# Patient Record
Sex: Female | Born: 1935
Health system: Southern US, Community
[De-identification: ages and names within clinical notes are randomized; demographics above are authoritative.]

## PROBLEM LIST (undated history)

## (undated) DIAGNOSIS — I10 Essential (primary) hypertension: Secondary | ICD-10-CM

## (undated) DIAGNOSIS — E119 Type 2 diabetes mellitus without complications: Secondary | ICD-10-CM

---

## 2005-04-27 ENCOUNTER — Ambulatory Visit: Payer: Self-pay

## 2005-05-01 ENCOUNTER — Emergency Department: Payer: Self-pay | Admitting: Emergency Medicine

## 2005-05-02 ENCOUNTER — Emergency Department: Payer: Self-pay | Admitting: Emergency Medicine

## 2005-05-26 ENCOUNTER — Emergency Department: Payer: Self-pay | Admitting: Emergency Medicine

## 2006-04-28 ENCOUNTER — Ambulatory Visit: Payer: Self-pay

## 2006-05-09 ENCOUNTER — Ambulatory Visit: Payer: Self-pay

## 2006-11-15 ENCOUNTER — Ambulatory Visit: Payer: Self-pay

## 2007-05-30 ENCOUNTER — Ambulatory Visit: Payer: Self-pay | Admitting: Internal Medicine

## 2008-02-02 ENCOUNTER — Emergency Department: Payer: Self-pay | Admitting: Emergency Medicine

## 2008-05-31 ENCOUNTER — Ambulatory Visit: Payer: Self-pay | Admitting: Internal Medicine

## 2009-01-14 ENCOUNTER — Ambulatory Visit: Payer: Self-pay | Admitting: Internal Medicine

## 2009-05-17 ENCOUNTER — Emergency Department: Payer: Self-pay | Admitting: Emergency Medicine

## 2010-03-11 ENCOUNTER — Ambulatory Visit: Payer: Self-pay | Admitting: Internal Medicine

## 2010-04-22 ENCOUNTER — Emergency Department: Payer: Self-pay | Admitting: Emergency Medicine

## 2010-04-24 ENCOUNTER — Ambulatory Visit: Payer: Self-pay | Admitting: Surgery

## 2010-05-12 ENCOUNTER — Ambulatory Visit: Payer: Self-pay | Admitting: Emergency Medicine

## 2010-05-15 LAB — PATHOLOGY REPORT

## 2010-12-13 ENCOUNTER — Emergency Department: Payer: Self-pay | Admitting: Emergency Medicine

## 2010-12-15 ENCOUNTER — Emergency Department: Payer: Self-pay | Admitting: Emergency Medicine

## 2011-03-31 ENCOUNTER — Ambulatory Visit: Payer: Self-pay | Admitting: Internal Medicine

## 2011-12-01 LAB — CBC
HCT: 39.2 % (ref 35.0–47.0)
MCH: 24.5 pg — ABNORMAL LOW (ref 26.0–34.0)
MCHC: 32.3 g/dL (ref 32.0–36.0)
MCV: 76 fL — ABNORMAL LOW (ref 80–100)
Platelet: 115 10*3/uL — ABNORMAL LOW (ref 150–440)
RDW: 14.6 % — ABNORMAL HIGH (ref 11.5–14.5)

## 2011-12-01 LAB — URINALYSIS, COMPLETE
Bilirubin,UR: NEGATIVE
Blood: NEGATIVE
Ketone: NEGATIVE
Nitrite: NEGATIVE
Ph: 6 (ref 4.5–8.0)
RBC,UR: NONE SEEN /HPF (ref 0–5)
Specific Gravity: 1.003 (ref 1.003–1.030)
Squamous Epithelial: <1
WBC UR: <1 /HPF (ref 0–5)

## 2011-12-01 LAB — COMPREHENSIVE METABOLIC PANEL
Albumin: 4 g/dL (ref 3.4–5.0)
Alkaline Phosphatase: 52 U/L (ref 50–136)
BUN: 14 mg/dL (ref 7–18)
Bilirubin,Total: 0.5 mg/dL (ref 0.2–1.0)
Calcium, Total: 9.5 mg/dL (ref 8.5–10.1)
Creatinine: 0.9 mg/dL (ref 0.60–1.30)
EGFR (Non-African Amer.): >60
Osmolality: 282 (ref 275–301)
SGPT (ALT): 29 U/L
Sodium: 140 mmol/L (ref 136–145)
Total Protein: 7.5 g/dL (ref 6.4–8.2)

## 2011-12-01 LAB — TROPONIN I: Troponin-I: <0.02 ng/mL

## 2011-12-02 ENCOUNTER — Observation Stay: Payer: Self-pay | Admitting: Internal Medicine

## 2011-12-02 DIAGNOSIS — I369 Nonrheumatic tricuspid valve disorder, unspecified: Secondary | ICD-10-CM

## 2011-12-02 LAB — LIPID PANEL
Cholesterol: 199 mg/dL (ref 0–200)
Triglycerides: 211 mg/dL — ABNORMAL HIGH (ref 0–200)
VLDL Cholesterol, Calc: 42 mg/dL — ABNORMAL HIGH (ref 5–40)

## 2011-12-02 LAB — TSH: Thyroid Stimulating Horm: 1.3 u[IU]/mL

## 2011-12-03 LAB — CBC WITH DIFFERENTIAL/PLATELET
Basophil #: 0 10*3/uL (ref 0.0–0.1)
Eosinophil #: 0.1 10*3/uL (ref 0.0–0.7)
Eosinophil %: 0.7 %
HCT: 42.5 % (ref 35.0–47.0)
Lymphocyte %: 16.8 %
MCH: 24.1 pg — ABNORMAL LOW (ref 26.0–34.0)
MCHC: 31.5 g/dL — ABNORMAL LOW (ref 32.0–36.0)
MCV: 76 fL — ABNORMAL LOW (ref 80–100)
Monocyte %: 6.8 %
Neutrophil %: 75.4 %
Platelet: 198 10*3/uL (ref 150–440)
RDW: 14.3 % (ref 11.5–14.5)
WBC: 8.3 10*3/uL (ref 3.6–11.0)

## 2011-12-03 LAB — BASIC METABOLIC PANEL
BUN: 15 mg/dL (ref 7–18)
EGFR (African American): >60
EGFR (Non-African Amer.): >60
Glucose: 202 mg/dL — ABNORMAL HIGH (ref 65–99)
Potassium: 3.7 mmol/L (ref 3.5–5.1)
Sodium: 146 mmol/L — ABNORMAL HIGH (ref 136–145)

## 2012-03-07 ENCOUNTER — Emergency Department: Payer: Self-pay | Admitting: Emergency Medicine

## 2012-03-07 LAB — CK TOTAL AND CKMB (NOT AT ARMC)
CK, Total: 132 U/L (ref 21–215)
CK-MB: 2.1 ng/mL (ref 0.5–3.6)

## 2012-03-07 LAB — COMPREHENSIVE METABOLIC PANEL
Albumin: 3.8 g/dL (ref 3.4–5.0)
Alkaline Phosphatase: 66 U/L (ref 50–136)
Anion Gap: 12 (ref 7–16)
BUN: 17 mg/dL (ref 7–18)
Calcium, Total: 9 mg/dL (ref 8.5–10.1)
Co2: 23 mmol/L (ref 21–32)
EGFR (Non-African Amer.): 50 — ABNORMAL LOW
Glucose: 273 mg/dL — ABNORMAL HIGH (ref 65–99)
Osmolality: 291 (ref 275–301)
Potassium: 3.8 mmol/L (ref 3.5–5.1)
SGPT (ALT): 23 U/L

## 2012-03-07 LAB — URINALYSIS, COMPLETE
Bilirubin,UR: NEGATIVE
Glucose,UR: 150 mg/dL (ref 0–75)
Ph: 5 (ref 4.5–8.0)
RBC,UR: 1 /HPF (ref 0–5)
Specific Gravity: 1.015 (ref 1.003–1.030)
Squamous Epithelial: 3
WBC UR: 1 /HPF (ref 0–5)

## 2012-03-07 LAB — CBC
MCH: 23.9 pg — ABNORMAL LOW (ref 26.0–34.0)
MCHC: 31.6 g/dL — ABNORMAL LOW (ref 32.0–36.0)
MCV: 76 fL — ABNORMAL LOW (ref 80–100)
RBC: 5.12 10*6/uL (ref 3.80–5.20)
RDW: 14.8 % — ABNORMAL HIGH (ref 11.5–14.5)
WBC: 9.4 10*3/uL (ref 3.6–11.0)

## 2012-03-31 ENCOUNTER — Ambulatory Visit: Payer: Self-pay | Admitting: Internal Medicine

## 2012-05-21 ENCOUNTER — Emergency Department: Payer: Self-pay | Admitting: Emergency Medicine

## 2013-04-02 ENCOUNTER — Ambulatory Visit: Payer: Self-pay | Admitting: Internal Medicine

## 2013-12-25 ENCOUNTER — Emergency Department: Payer: Self-pay | Admitting: Emergency Medicine

## 2014-04-05 ENCOUNTER — Ambulatory Visit: Payer: Self-pay | Admitting: Internal Medicine

## 2014-06-06 ENCOUNTER — Emergency Department: Payer: Self-pay | Admitting: Student

## 2014-06-06 LAB — BASIC METABOLIC PANEL
Anion Gap: 13 (ref 7–16)
BUN: 27 mg/dL — AB (ref 7–18)
CHLORIDE: 102 mmol/L (ref 98–107)
CO2: 25 mmol/L (ref 21–32)
Calcium, Total: 8.9 mg/dL (ref 8.5–10.1)
Creatinine: 1.78 mg/dL — ABNORMAL HIGH (ref 0.60–1.30)
EGFR (African American): 36 — ABNORMAL LOW
GFR CALC NON AF AMER: 30 — AB
Glucose: 208 mg/dL — ABNORMAL HIGH (ref 65–99)
Osmolality: 291 (ref 275–301)
Potassium: 3.6 mmol/L (ref 3.5–5.1)
Sodium: 140 mmol/L (ref 136–145)

## 2014-06-06 LAB — CBC WITH DIFFERENTIAL/PLATELET
BASOS ABS: 0.1 10*3/uL (ref 0.0–0.1)
Basophil %: 0.5 %
EOS ABS: 0.1 10*3/uL (ref 0.0–0.7)
Eosinophil %: 1.2 %
HCT: 38.5 % (ref 35.0–47.0)
HGB: 12.1 g/dL (ref 12.0–16.0)
Lymphocyte #: 1.6 10*3/uL (ref 1.0–3.6)
Lymphocyte %: 14 %
MCH: 23.7 pg — ABNORMAL LOW (ref 26.0–34.0)
MCHC: 31.5 g/dL — ABNORMAL LOW (ref 32.0–36.0)
MCV: 75 fL — ABNORMAL LOW (ref 80–100)
MONO ABS: 0.9 x10 3/mm (ref 0.2–0.9)
Monocyte %: 8.2 %
Neutrophil #: 8.6 10*3/uL — ABNORMAL HIGH (ref 1.4–6.5)
Neutrophil %: 76.1 %
Platelet: 155 10*3/uL (ref 150–440)
RBC: 5.12 10*6/uL (ref 3.80–5.20)
RDW: 14.2 % (ref 11.5–14.5)
WBC: 11.3 10*3/uL — ABNORMAL HIGH (ref 3.6–11.0)

## 2014-06-06 LAB — TROPONIN I: Troponin-I: <0.02 ng/mL

## 2014-06-06 LAB — URINALYSIS, COMPLETE
Bilirubin,UR: NEGATIVE
Blood: NEGATIVE
Glucose,UR: NEGATIVE mg/dL (ref 0–75)
KETONE: NEGATIVE
Leukocyte Esterase: NEGATIVE
NITRITE: POSITIVE
Ph: 5 (ref 4.5–8.0)
Protein: NEGATIVE
SPECIFIC GRAVITY: 1.006 (ref 1.003–1.030)
Squamous Epithelial: 1
WBC UR: 1 /HPF (ref 0–5)

## 2014-06-06 LAB — HEPATIC FUNCTION PANEL A (ARMC)
AST: 34 U/L (ref 15–37)
Albumin: 3.6 g/dL (ref 3.4–5.0)
Alkaline Phosphatase: 50 U/L
BILIRUBIN DIRECT: 0.1 mg/dL (ref 0.00–0.20)
BILIRUBIN TOTAL: 0.3 mg/dL (ref 0.2–1.0)
SGPT (ALT): 25 U/L
TOTAL PROTEIN: 6.5 g/dL (ref 6.4–8.2)

## 2014-06-09 LAB — URINE CULTURE

## 2014-06-25 ENCOUNTER — Inpatient Hospital Stay: Payer: Self-pay | Admitting: Internal Medicine

## 2014-06-25 LAB — COMPREHENSIVE METABOLIC PANEL
ALBUMIN: 3.2 g/dL — AB (ref 3.4–5.0)
ANION GAP: 8 (ref 7–16)
Alkaline Phosphatase: 37 U/L — ABNORMAL LOW
BILIRUBIN TOTAL: 0.3 mg/dL (ref 0.2–1.0)
BUN: 25 mg/dL — ABNORMAL HIGH (ref 7–18)
CHLORIDE: 104 mmol/L (ref 98–107)
CO2: 31 mmol/L (ref 21–32)
Calcium, Total: 9.5 mg/dL (ref 8.5–10.1)
Creatinine: 1.66 mg/dL — ABNORMAL HIGH (ref 0.60–1.30)
EGFR (African American): 39 — ABNORMAL LOW
EGFR (Non-African Amer.): 32 — ABNORMAL LOW
Glucose: 158 mg/dL — ABNORMAL HIGH (ref 65–99)
Osmolality: 293 (ref 275–301)
POTASSIUM: 3.9 mmol/L (ref 3.5–5.1)
SGOT(AST): 24 U/L (ref 15–37)
SGPT (ALT): 23 U/L
Sodium: 143 mmol/L (ref 136–145)
Total Protein: 6 g/dL — ABNORMAL LOW (ref 6.4–8.2)

## 2014-06-25 LAB — CBC
HCT: 35.5 % (ref 35.0–47.0)
HGB: 11.1 g/dL — ABNORMAL LOW (ref 12.0–16.0)
MCH: 23.8 pg — ABNORMAL LOW (ref 26.0–34.0)
MCHC: 31.4 g/dL — AB (ref 32.0–36.0)
MCV: 76 fL — ABNORMAL LOW (ref 80–100)
PLATELETS: 178 10*3/uL (ref 150–440)
RBC: 4.67 10*6/uL (ref 3.80–5.20)
RDW: 14.5 % (ref 11.5–14.5)
WBC: 7.7 10*3/uL (ref 3.6–11.0)

## 2014-06-25 LAB — HEMOGLOBIN A1C: HEMOGLOBIN A1C: 7 % — AB (ref 4.2–6.3)

## 2014-06-25 LAB — URINALYSIS, COMPLETE
Bilirubin,UR: NEGATIVE
Glucose,UR: 50 mg/dL (ref 0–75)
Ketone: NEGATIVE
Nitrite: POSITIVE
PH: 5 (ref 4.5–8.0)
PROTEIN: NEGATIVE
Specific Gravity: 1.016 (ref 1.003–1.030)
Squamous Epithelial: 3

## 2014-06-25 LAB — TSH: Thyroid Stimulating Horm: 1.75 u[IU]/mL

## 2014-06-26 LAB — CBC WITH DIFFERENTIAL/PLATELET
Basophil #: 0 10*3/uL (ref 0.0–0.1)
Basophil %: 0.4 %
Eosinophil #: 0.2 10*3/uL (ref 0.0–0.7)
Eosinophil %: 3.1 %
HCT: 34.3 % — AB (ref 35.0–47.0)
HGB: 10.5 g/dL — ABNORMAL LOW (ref 12.0–16.0)
LYMPHS ABS: 1.7 10*3/uL (ref 1.0–3.6)
LYMPHS PCT: 25 %
MCH: 23.3 pg — ABNORMAL LOW (ref 26.0–34.0)
MCHC: 30.7 g/dL — ABNORMAL LOW (ref 32.0–36.0)
MCV: 76 fL — ABNORMAL LOW (ref 80–100)
MONOS PCT: 9.5 %
Monocyte #: 0.7 x10 3/mm (ref 0.2–0.9)
Neutrophil #: 4.3 10*3/uL (ref 1.4–6.5)
Neutrophil %: 62 %
Platelet: 164 10*3/uL (ref 150–440)
RBC: 4.51 10*6/uL (ref 3.80–5.20)
RDW: 14.6 % — AB (ref 11.5–14.5)
WBC: 6.9 10*3/uL (ref 3.6–11.0)

## 2014-06-26 LAB — BASIC METABOLIC PANEL
ANION GAP: 8 (ref 7–16)
BUN: 23 mg/dL — ABNORMAL HIGH (ref 7–18)
Calcium, Total: 8.7 mg/dL (ref 8.5–10.1)
Chloride: 106 mmol/L (ref 98–107)
Co2: 29 mmol/L (ref 21–32)
Creatinine: 1.4 mg/dL — ABNORMAL HIGH (ref 0.60–1.30)
EGFR (African American): 47 — ABNORMAL LOW
GFR CALC NON AF AMER: 39 — AB
GLUCOSE: 169 mg/dL — AB (ref 65–99)
Osmolality: 293 (ref 275–301)
Potassium: 3.4 mmol/L — ABNORMAL LOW (ref 3.5–5.1)
Sodium: 143 mmol/L (ref 136–145)

## 2014-06-27 LAB — BASIC METABOLIC PANEL
Anion Gap: 8 (ref 7–16)
BUN: 24 mg/dL — ABNORMAL HIGH (ref 7–18)
CALCIUM: 9.1 mg/dL (ref 8.5–10.1)
CO2: 29 mmol/L (ref 21–32)
CREATININE: 1.37 mg/dL — AB (ref 0.60–1.30)
Chloride: 105 mmol/L (ref 98–107)
EGFR (Non-African Amer.): 40 — ABNORMAL LOW
GFR CALC AF AMER: 48 — AB
Glucose: 226 mg/dL — ABNORMAL HIGH (ref 65–99)
OSMOLALITY: 294 (ref 275–301)
POTASSIUM: 3.9 mmol/L (ref 3.5–5.1)
Sodium: 142 mmol/L (ref 136–145)

## 2014-09-26 ENCOUNTER — Emergency Department: Payer: Self-pay | Admitting: Emergency Medicine

## 2014-09-28 LAB — CBC WITH DIFFERENTIAL/PLATELET
BASOS ABS: 0.1 10*3/uL (ref 0.0–0.1)
Basophil %: 0.4 %
EOS PCT: 0.2 %
Eosinophil #: 0 10*3/uL (ref 0.0–0.7)
HCT: 38.7 % (ref 35.0–47.0)
HGB: 11.6 g/dL — ABNORMAL LOW (ref 12.0–16.0)
LYMPHS ABS: 0.6 10*3/uL — AB (ref 1.0–3.6)
Lymphocyte %: 3.8 %
MCH: 22.8 pg — ABNORMAL LOW (ref 26.0–34.0)
MCHC: 30.1 g/dL — ABNORMAL LOW (ref 32.0–36.0)
MCV: 76 fL — AB (ref 80–100)
MONOS PCT: 7.3 %
Monocyte #: 1.2 x10 3/mm — ABNORMAL HIGH (ref 0.2–0.9)
NEUTROS PCT: 88.3 %
Neutrophil #: 14.1 10*3/uL — ABNORMAL HIGH (ref 1.4–6.5)
Platelet: 210 10*3/uL (ref 150–440)
RBC: 5.11 10*6/uL (ref 3.80–5.20)
RDW: 15.4 % — ABNORMAL HIGH (ref 11.5–14.5)
WBC: 16 10*3/uL — ABNORMAL HIGH (ref 3.6–11.0)

## 2014-09-28 LAB — URINALYSIS, COMPLETE
Blood: NEGATIVE
Glucose,UR: NEGATIVE mg/dL (ref 0–75)
NITRITE: NEGATIVE
PH: 5 (ref 4.5–8.0)
Specific Gravity: 1.035 (ref 1.003–1.030)
Squamous Epithelial: NONE SEEN

## 2014-09-28 LAB — COMPREHENSIVE METABOLIC PANEL
ALBUMIN: 3.5 g/dL (ref 3.4–5.0)
ALK PHOS: 132 U/L — AB
ALT: 154 U/L — AB
Anion Gap: 9 (ref 7–16)
BUN: 22 mg/dL — ABNORMAL HIGH (ref 7–18)
Bilirubin,Total: 0.5 mg/dL (ref 0.2–1.0)
CREATININE: 0.97 mg/dL (ref 0.60–1.30)
Calcium, Total: 9.9 mg/dL (ref 8.5–10.1)
Chloride: 103 mmol/L (ref 98–107)
Co2: 30 mmol/L (ref 21–32)
EGFR (African American): >60
EGFR (Non-African Amer.): 59 — ABNORMAL LOW
GLUCOSE: 231 mg/dL — AB (ref 65–99)
Osmolality: 294 (ref 275–301)
Potassium: 4.1 mmol/L (ref 3.5–5.1)
SGOT(AST): 60 U/L — ABNORMAL HIGH (ref 15–37)
SODIUM: 142 mmol/L (ref 136–145)
Total Protein: 6.9 g/dL (ref 6.4–8.2)

## 2014-09-28 LAB — LIPASE, BLOOD: Lipase: 20 U/L — ABNORMAL LOW (ref 73–393)

## 2014-09-29 ENCOUNTER — Inpatient Hospital Stay: Payer: Self-pay | Admitting: Internal Medicine

## 2014-09-30 LAB — COMPREHENSIVE METABOLIC PANEL
ANION GAP: 10 (ref 7–16)
Albumin: 2.7 g/dL — ABNORMAL LOW (ref 3.4–5.0)
Alkaline Phosphatase: 95 U/L
BUN: 14 mg/dL (ref 7–18)
Bilirubin,Total: 0.5 mg/dL (ref 0.2–1.0)
CO2: 28 mmol/L (ref 21–32)
CREATININE: 0.77 mg/dL (ref 0.60–1.30)
Calcium, Total: 8.9 mg/dL (ref 8.5–10.1)
Chloride: 104 mmol/L (ref 98–107)
Glucose: 132 mg/dL — ABNORMAL HIGH (ref 65–99)
OSMOLALITY: 285 (ref 275–301)
POTASSIUM: 2.7 mmol/L — AB (ref 3.5–5.1)
SGOT(AST): 33 U/L (ref 15–37)
SGPT (ALT): 78 U/L — ABNORMAL HIGH
SODIUM: 142 mmol/L (ref 136–145)
Total Protein: 5.9 g/dL — ABNORMAL LOW (ref 6.4–8.2)

## 2014-09-30 LAB — CBC WITH DIFFERENTIAL/PLATELET
BASOS ABS: 0 10*3/uL (ref 0.0–0.1)
Basophil %: 0.4 %
EOS PCT: 1.3 %
Eosinophil #: 0.1 10*3/uL (ref 0.0–0.7)
HCT: 34.4 % — ABNORMAL LOW (ref 35.0–47.0)
HGB: 10.9 g/dL — ABNORMAL LOW (ref 12.0–16.0)
LYMPHS ABS: 1.1 10*3/uL (ref 1.0–3.6)
Lymphocyte %: 12.1 %
MCH: 23.6 pg — AB (ref 26.0–34.0)
MCHC: 31.7 g/dL — AB (ref 32.0–36.0)
MCV: 74 fL — ABNORMAL LOW (ref 80–100)
MONO ABS: 0.6 x10 3/mm (ref 0.2–0.9)
Monocyte %: 6.8 %
NEUTROS PCT: 79.4 %
Neutrophil #: 7.4 10*3/uL — ABNORMAL HIGH (ref 1.4–6.5)
PLATELETS: 184 10*3/uL (ref 150–440)
RBC: 4.62 10*6/uL (ref 3.80–5.20)
RDW: 14.7 % — AB (ref 11.5–14.5)
WBC: 9.3 10*3/uL (ref 3.6–11.0)

## 2014-09-30 LAB — URINE CULTURE

## 2014-10-01 LAB — CBC WITH DIFFERENTIAL/PLATELET
BASOS PCT: 0.9 %
Basophil #: 0.1 10*3/uL (ref 0.0–0.1)
EOS PCT: 1.4 %
Eosinophil #: 0.1 10*3/uL (ref 0.0–0.7)
HCT: 35.1 % (ref 35.0–47.0)
HGB: 11 g/dL — ABNORMAL LOW (ref 12.0–16.0)
LYMPHS PCT: 13.8 %
Lymphocyte #: 1.2 10*3/uL (ref 1.0–3.6)
MCH: 23.2 pg — ABNORMAL LOW (ref 26.0–34.0)
MCHC: 31.3 g/dL — ABNORMAL LOW (ref 32.0–36.0)
MCV: 74 fL — ABNORMAL LOW (ref 80–100)
Monocyte #: 0.7 x10 3/mm (ref 0.2–0.9)
Monocyte %: 7.9 %
Neutrophil #: 6.4 10*3/uL (ref 1.4–6.5)
Neutrophil %: 76 %
Platelet: 203 10*3/uL (ref 150–440)
RBC: 4.73 10*6/uL (ref 3.80–5.20)
RDW: 15 % — ABNORMAL HIGH (ref 11.5–14.5)
WBC: 8.4 10*3/uL (ref 3.6–11.0)

## 2014-10-01 LAB — COMPREHENSIVE METABOLIC PANEL
Albumin: 2.6 g/dL — ABNORMAL LOW (ref 3.4–5.0)
Alkaline Phosphatase: 87 U/L
Anion Gap: 12 (ref 7–16)
BILIRUBIN TOTAL: 0.5 mg/dL (ref 0.2–1.0)
BUN: 7 mg/dL (ref 7–18)
CHLORIDE: 105 mmol/L (ref 98–107)
CREATININE: 0.64 mg/dL (ref 0.60–1.30)
Calcium, Total: 8.5 mg/dL (ref 8.5–10.1)
Co2: 22 mmol/L (ref 21–32)
EGFR (Non-African Amer.): >60
Glucose: 124 mg/dL — ABNORMAL HIGH (ref 65–99)
Osmolality: 277 (ref 275–301)
Potassium: 4.9 mmol/L (ref 3.5–5.1)
SGOT(AST): 48 U/L — ABNORMAL HIGH (ref 15–37)
SGPT (ALT): 63 U/L
Sodium: 139 mmol/L (ref 136–145)
Total Protein: 6.1 g/dL — ABNORMAL LOW (ref 6.4–8.2)

## 2014-10-02 LAB — BASIC METABOLIC PANEL
Anion Gap: 15 (ref 7–16)
BUN: 8 mg/dL (ref 7–18)
CHLORIDE: 106 mmol/L (ref 98–107)
Calcium, Total: 9.2 mg/dL (ref 8.5–10.1)
Co2: 20 mmol/L — ABNORMAL LOW (ref 21–32)
Creatinine: 0.8 mg/dL (ref 0.60–1.30)
EGFR (African American): >60
Glucose: 142 mg/dL — ABNORMAL HIGH (ref 65–99)
Osmolality: 282 (ref 275–301)
POTASSIUM: 3.6 mmol/L (ref 3.5–5.1)
Sodium: 141 mmol/L (ref 136–145)

## 2014-10-02 LAB — CBC WITH DIFFERENTIAL/PLATELET
BASOS PCT: 0.6 %
Basophil #: 0 10*3/uL (ref 0.0–0.1)
EOS ABS: 0.1 10*3/uL (ref 0.0–0.7)
Eosinophil %: 1.8 %
HCT: 36 % (ref 35.0–47.0)
HGB: 11.5 g/dL — AB (ref 12.0–16.0)
LYMPHS ABS: 1.3 10*3/uL (ref 1.0–3.6)
LYMPHS PCT: 17.6 %
MCH: 23.4 pg — AB (ref 26.0–34.0)
MCHC: 31.8 g/dL — AB (ref 32.0–36.0)
MCV: 73 fL — ABNORMAL LOW (ref 80–100)
Monocyte #: 0.7 x10 3/mm (ref 0.2–0.9)
Monocyte %: 9 %
NEUTROS ABS: 5.3 10*3/uL (ref 1.4–6.5)
Neutrophil %: 71 %
Platelet: 236 10*3/uL (ref 150–440)
RBC: 4.91 10*6/uL (ref 3.80–5.20)
RDW: 14.8 % — ABNORMAL HIGH (ref 11.5–14.5)
WBC: 7.4 10*3/uL (ref 3.6–11.0)

## 2014-10-03 LAB — BASIC METABOLIC PANEL
ANION GAP: 10 (ref 7–16)
BUN: 14 mg/dL (ref 7–18)
CHLORIDE: 108 mmol/L — AB (ref 98–107)
CO2: 24 mmol/L (ref 21–32)
Calcium, Total: 9.3 mg/dL (ref 8.5–10.1)
Creatinine: 0.89 mg/dL (ref 0.60–1.30)
EGFR (Non-African Amer.): >60
GLUCOSE: 226 mg/dL — AB (ref 65–99)
OSMOLALITY: 291 (ref 275–301)
Potassium: 3.6 mmol/L (ref 3.5–5.1)
Sodium: 142 mmol/L (ref 136–145)

## 2014-10-03 LAB — CBC WITH DIFFERENTIAL/PLATELET
BASOS ABS: 0.1 10*3/uL (ref 0.0–0.1)
Basophil %: 0.8 %
EOS PCT: 0.6 %
Eosinophil #: 0 10*3/uL (ref 0.0–0.7)
HCT: 35.2 % (ref 35.0–47.0)
HGB: 11 g/dL — AB (ref 12.0–16.0)
LYMPHS ABS: 1.1 10*3/uL (ref 1.0–3.6)
LYMPHS PCT: 14.5 %
MCH: 22.9 pg — ABNORMAL LOW (ref 26.0–34.0)
MCHC: 31.3 g/dL — ABNORMAL LOW (ref 32.0–36.0)
MCV: 73 fL — AB (ref 80–100)
Monocyte #: 0.8 x10 3/mm (ref 0.2–0.9)
Monocyte %: 10.9 %
NEUTROS ABS: 5.4 10*3/uL (ref 1.4–6.5)
Neutrophil %: 73.2 %
Platelet: 236 10*3/uL (ref 150–440)
RBC: 4.81 10*6/uL (ref 3.80–5.20)
RDW: 14.3 % (ref 11.5–14.5)
WBC: 7.4 10*3/uL (ref 3.6–11.0)

## 2014-10-03 LAB — CULTURE, BLOOD (SINGLE)

## 2014-10-04 LAB — CULTURE, BLOOD (SINGLE)

## 2015-01-04 NOTE — H&P (Signed)
PATIENT NAME:  Natalie Knight, Natalie Knight MR#:  [id number] DATE OF BIRTH:  10-10-1935  PRIMARY CARE PHYSICIAN: Serita Sheller. Maryellen Pile, MD  CHIEF COMPLAINT: Confusion.  HISTORY OF PRESENTING ILLNESS: A 79 year old [place] American female patient with history of hypertension, diabetes, dementia, arthritis, was noted by her daughter that she was confused earlier today more than normal and was brought to the Emergency Room here. Her blood sugars were found to be extremely low into the 30s and in spite of giving her D50, blood sugars were persistently low and is being admitted to the hospitalist service. A CT scan of the head was done which is negative for anything acute.   She has not had any change in medications, but has had poor appetite and seems to skip meals at times and decrease meal intake. Blood sugars can run as high as 200 at home. Daughter also mentioned that she has had progressive lower extremity weakness and falls over the last 6 months, and walks with a walker at baseline.   Presently, the patient is back to baseline, is alert, oriented to place and person, but not to time.   PAST MEDICAL HISTORY: 1.  Diabetes mellitus.  2.  Hypertension.  3.  Dementia.  4.  Arthritis.  5.  History of sciatica. 6.  Gout.   PAST SURGICAL HISTORY: 1.  Hemorrhoidectomy.  2.  Hysterectomy.   SOCIAL HISTORY: The patient does not smoke. No alcohol. No illicit drug use. Ambulates with a walker at baseline.   CODE STATUS: Full code.  FAMILY HISTORY:  Positive for hypertension.   REVIEW OF SYSTEMS: CONSTITUTIONAL: Complains of fatigue.  EYES: No blurred vision, pain or redness.  EARS, NOSE, AND THROAT: No tinnitus, ear pain, hearing loss.  RESPIRATORY: No cough, wheeze, or hemoptysis.  CARDIOVASCULAR: No chest pain, orthopnea, edema.  GASTROINTESTINAL: No nausea, vomiting, diarrhea, abdominal pain.  GENITOURINARY: No dysuria, hematuria, or frequency.  ENDOCRINE: No polyuria, nocturia, or thyroid problems.   HEMATOLOGIC AND LYMPHATIC: No anemia, easy bruising, or bleeding. INTEGUMENTARY: No acne, rash, lesion.  MUSCULOSKELETAL: Has arthritis, back pain. NEUROLOGIC: Has progressive weakness in lower extremities chronically.   PSYCHIATRIC: Seems to have some depression.   HOME MEDICATIONS: 1.  Acetaminophen-hydrocodone 5/325 one tablet 4 times a day as needed for pain.  2.  Amlodipine 5 mg a day. 3.  Aspirin 81 mg daily. 4.  Cortisporin 2 drops to affected ear 4 times a day.  5.  Docusate sodium 100 mg 2 capsules oral once a day.  6.  Exelon 4.6 mg one patch transdermal once a day. 7.  Glyburide 5 mg 2 tablets 2 times a day.  8.  Metformin 5 mg 2 tablets 2 times a day. 9.  Simvastatin 20 mg daily.  10.  Vimovo 500-20 mg 1 tablet 2 times a day. 11.  Vitamin B12 500 mcg 2 tablets once a day.   ALLERGIES: LEVAQUIN.   PHYSICAL EXAMINATION: VITAL SIGNS: Temperature of 97.9, pulse 63, blood pressure 117/66, saturating 100% on room air.  GENERAL: Elderly African American female patient lying in bed, overall seems comfortable, conversational.  PSYCHIATRIC: Alert, oriented to person and place, but not to time. Pleasant. HEENT:  Atraumatic, normocephalic. Oral mucosa dry and pink.   External ears and nose normal. Pallor positive. No icterus. Pupils bilaterally equal and reactive to light.  NECK: Supple. No thyromegaly. No palpable lymph nodes. Trachea midline. No carotid bruit or JVD.  CARDIOVASCULAR: S1, S2, without any murmurs. Pulses 2+. No edema. RESPIRATORY: Normal work  of breathing, clear to auscultation on both sides. GASTROINTESTINAL: Soft abdomen, nontender. Bowel sounds present. No organomegaly palpable. SKIN:  Warm and dry. No petechiae, no rash, or ulcers.  MUSCULOSKELETAL:  No joint swelling, redness, effusion of the large joints. Normal muscle tone.   NEUROLOGIC: Motor strength 5-/5 bilaterally in the lower extremities, 5/5 in upper extremities.  Sensation function is intact all over.  Gait not tested.  LYMPHATIC: No cervical lymphadenopathy.  LABORATORY DATA AND IMAGING:  A CT scan showing atrophy, but no acute strokes. Glucose as low as 30.  Repeat Accu-Chek of 55 with BUN 25, creatinine 1.66, sodium 143, potassium 3.9, chloride 104, bicarbonate 31. AST, ALT, alkaline phosphatase, bilirubin normal. WBC 9.7, hemoglobin 11.9, platelets of 178,000.   ASSESSMENT AND PLAN: 1.  Hypoglycemia secondary to skipping meals in a patient on glyburide. It is unclear why her blood sugars are still persistently low in spite of D50. Will put her on D5 normal saline, admit as inpatient onto a telemetry floor. At this time, will hold her diabetes medications and these need to be reduced at the time of discharge. We will also check hemoglobin A1c. Check TSH and cortisol levels.   2.  Gait abnormality with recurrent falls. The patient has had recurrent falls over the 6 months with worsening gait abnormality. The patient does have a diagnosis of dementia. This could be the possible cause, but we will check B12, TSH levels. The patient did have a CT scan which shows no acute stroke. Have physical therapy see the patient. She may need home health physical therapy or skilled nursing facility at discharge.  3.  Hypertension. Continue medications.  4.  Dementia. Watch for any inpatient delirium, continue the Exelon patch the patient is on.  5.  Deep vein thrombosis prophylaxis with Lovenox.   CODE STATUS: Full code.  TIME SPENT TODAY ON THIS CASE: 40 minutes.   ____________________________ Molinda BailiffSrikar R. Firmin Belisle, MD srs:LT D: 06/25/2014 17:17:45 ET T: 06/25/2014 17:52:58 ET JOB#: [id number]  cc: Wardell HeathSrikar R. Cherisse Carrell, MD, <Dictator> Serita ShellerErnest B. Maryellen PileEason, MD Orie FishermanSRIKAR R Deslyn Cavenaugh MD ELECTRONICALLY SIGNED 06/26/2014 17:05

## 2015-01-04 NOTE — Discharge Summary (Signed)
Dates of Admission and Diagnosis:  Date of Admission 25-Jun-2014   Date of Discharge 27-Jun-2014   Admitting Diagnosis Urinary tract infection   Final Diagnosis Altered mental status- due to Urineray Infection and Hypoglycemia Urinary tract infection Hypoglycemia Hypertension Diabetes mellitus    Chief Complaint/History of Present Illness A 79 year old African American female patient with history of hypertension, diabetes, dementia, arthritis, was noted by her daughter that she was confused earlier today more than normal and was brought to the Emergency Room here. Her blood sugars were found to be extremely low into the 30s and in spite of giving her D50, blood sugars were persistently low and is being admitted to the hospitalist service. A CT scan of the head was done which is negative for anything acute.   She has not had any change in medications, but has had poor appetite and seems to skip meals at times and decrease meal intake. Blood sugars can run as high as 200 at home. Daughter also mentioned that she has had progressive lower extremity weakness and falls over the last 6 months, and walks with a walker at baseline.   Presently, the patient is back to baseline, is alert, oriented to place and person, but not to time.   Allergies:  Levaquin: Itching, Rash  Routine Chem:  13-Oct-15 10:27   Creatinine (comp)  1.66  Hemoglobin A1c ([facility])  7.0 (The American Diabetes Association recommends that a primary goal of therapy should be <7% and that physicians should reevaluate the treatment regimen in patients with HbA1c values consistently >8%.)  14-Oct-15 05:38   Creatinine (comp)  1.40  15-Oct-15 09:37   Creatinine (comp)  1.37  Routine UA:  13-Oct-15 02:36   Color (UA) Yellow  Clarity (UA) Cloudy  Glucose (UA) 50 mg/dL  Bilirubin (UA) Negative  Ketones (UA) Negative  Specific Gravity (UA) 1.016  Blood (UA) 1+  pH (UA) 5.0  Protein (UA) Negative  Nitrite (UA) Positive   Leukocyte Esterase (UA) 3+ (Result(s) reported on 25 Jun 2014 at 10:01PM.)  RBC (UA) 5 /HPF  WBC (UA) 47 /HPF  Bacteria (UA) 3+  Epithelial Cells (UA) 3 /HPF  Mucous (UA) PRESENT (Result(s) reported on 25 Jun 2014 at 10:01PM.)   Pertinent Past History:  Pertinent Past History 1.  Diabetes mellitus.  2.  Hypertension.  3.  Dementia.  4.  Arthritis.  5.  History of sciatica. 6.  Gout.   Hospital Course:  Hospital Course 79 yo female w/ hx of dementia, HTN, DM, Hyperlipidemia presented to the hospital w/ Altered mental status.  1. AMS - likely metabolic encephalopathy related to UTI/hypoglycemia.  - CT head (-).  Hypoglycemia improved w/ D5 gtt ( stopped since yesterday) and also on IV Ceftraixone for UTI and follow mental status.    now totally alert and oriented.  2. Hypoglycemia - likely due to poor PO intake and also concomitant infection (UTI) - improved w/ Dextrose gtt.  Now taking PO and d/c fluids .   - follow BS. remained stable, now actually elevated. Hold Glimeperide for now.    will resume metfrmin,but no glimiperide.  3. UTI - IV ceftraixone - swich to oral on d/c.  4. Generalized weakness/frequent falls - likely related to dementia/UTI - seen by PT and they recommend Home w/ home health and will arrange prior to discharge.  5. GERD - cont. Protonix.   6. Hyperlipidemia - cont. Simvastatin  7. HTN - cont. Norvasc.   Condition on Discharge Stable   Code  Status:  Code Status Full Code   DISCHARGE INSTRUCTIONS HOME MEDS:  Medication Reconciliation: Patient's Home Medications at Discharge:     Medication Instructions  amlodipine 5 mg oral tablet  1 tab(s) orally once a day   exelon 4.6 mg/24 hr transdermal film, extended release  1 patch transdermal once a day   simvastatin 20 mg oral tablet  1 tab(s) orally once a day (at bedtime)   vimovo 500 mg-20 mg oral delayed release tablet  1 tab(s) orally 2 times a day for joint pain   vitamin b-12 500 mcg oral  tablet  2 tab(s) orally once a day   docusate sodium 100 mg oral capsule  2 cap(s) orally once a day   aspirin 81 mg oral delayed release tablet  1 tab(s) orally once a day   cortisporin otic 1%-0.35%-10000 units/ml solution  2  drops to each affected ear 4 times a day   acetaminophen-hydrocodone 325 mg-5 mg oral tablet  1 tab(s) orally every 6 hours, As Needed - for severe pain    metformin 500 mg oral tablet  1 tab(s) orally 2 times a day (with meals)   ceftin 250 mg oral tablet  1 tab(s) orally 2 times a day x 3 days   glucerna shake *  237 milliliter(s) orally 3 times a day (with meals)    STOP TAKING THE FOLLOWING MEDICATION(S):    glyburide 5 mg oral tablet: 2 tab(s) orally 2 times a day for sugar  Physician's Instructions:  Home Health? Yes   Home Health Service Physicial Therapy  Nurse  Nurse Aid   Diet Carbohydrate Controlled (ADA) Diet   Dietary Supplements Glucerna   Dietary Supplements Frequency Three times per day   Activity Limitations As tolerated  After standing up- stop for few seconds  holding somethiong, before you start walking.   Return to Work Not Applicable   Time frame for Follow Up Appointment 1-2 weeks  PMD   Other Comments Follow with PMD in office in 1-2 weeks.     Toy CookeyEason, Ernest B(Family Physician): Medical [address], [address], [place], [place] [address], [place] [phone]  TIME SPENT:  Total Time: Greater than 30 minutes   Electronic Signatures: Altamese DillingVachhani, Twania Bujak (MD)  (Signed 19-Oct-15 08:45)  Authored: ADMISSION DATE AND DIAGNOSIS, CHIEF COMPLAINT/HPI, Allergies, PERTINENT LABS, PERTINENT PAST HISTORY, HOSPITAL COURSE, DISCHARGE INSTRUCTIONS HOME MEDS, PATIENT INSTRUCTIONS, Follow Up Physician, TIME SPENT   Last Updated: 19-Oct-15 08:45 by Altamese DillingVachhani, Floyd Wade (MD)

## 2015-01-05 NOTE — Discharge Summary (Signed)
PATIENT NAME:  Natalie Knight, Natalie Knight MR#:  [id number] DATE OF BIRTH:  [birth year]  DATE OF ADMISSION:  12/02/2011 DATE OF DISCHARGE:  12/07/2011  DIAGNOSES:  1. Contusion possibly due to hypertensive encephalopathy versus progressive dementia, likely progressive dementia. 2. Dementia. 3. Hypertension. 4. Diabetes. 5. Gout.   DISPOSITION: Patient is being discharged home with home health.   FOLLOW UP: Follow up with primary care physician, Dr. Maryellen PileEason, in 1 to 2 weeks after discharge.   DIET: Low sodium, 1800 calorie ADA diet.   ACTIVITY: As tolerated.   DISCHARGE MEDICATIONS:  1. Aspirin 81 mg daily.  2. Tylenol 650 mg q.6 hours Knight.r.n.  3. Lopressor 100 mg b.i.d.  4. Lisinopril 10 mg daily.  5. Metformin 500 mg b.i.d.  6. Senokot 1 tablet Knight.o. b.i.d., hold for loose stools. 7. Zofran 4 mg ODT q.6 hours Knight.r.n.  8. Glucotrol XL 5 mg Knight.o. a.c. breakfast. 9. Zocor 20 mg daily.  10. Allopurinol 300 mg daily.   LABORATORY, DIAGNOSTIC AND RADIOLOGICAL DATA: CAT scan of the head showed no acute abnormalities. Echo showed no acute abnormalities, normal ejection fraction. B12 normal. Urinalysis showed no evidence of infection. CBC was essentially normal. Complete metabolic panel was also essentially normal. LDL 89, hemoglobin A1c 8, triglycerides 211. Normal TSH. Normal cardiac enzymes. Normal LFTs.   HOSPITAL COURSE: Patient is a 79 year old female with past medical history of dementia, hypertension, hyperlipidemia, diabetes who was brought by her family for increasing confusion. CAT scan of the head was negative. Carotid ultrasounds were negative. Echo showed normal ejection fraction. There is a strong possibility that patient's confusion was due to progressive dementia as patient's family reported increasing confusion and forgetfulness over several months. Differential diagnoses also included hypertensive encephalopathy since blood pressure was elevated initially. B12 and TSH levels were checked and  were normal. Patient's diabetes and hypertension remained well controlled during the hospitalization. Her hemoglobin A1c was 8. After initially going to take the patient home the family decided that they wanted placement for the patient because of her progressive dementia. The case management team worked very hard and found an assisted living facility for the patient, however, eventually the patient's family decided to take her home because they were not willing for medicaid to pay for her assisted living facility placement. They reported that the patient will return to her apartment and that her son will live with her. They that they will be able to arrange for 24 hour/7 days a week care for the patient. Home health has also been arranged for the patient by the time of discharge.   TIME SPENT: 45 minutes.   ____________________________ Darrick MeigsSangeeta Linnae Rasool, MD sp:cms D: 12/07/2011 16:24:09 ET T: 12/08/2011 10:37:22 ET JOB#: [id number]  cc: Darrick MeigsSangeeta Christie Viscomi, MD, <Dictator> Serita ShellerErnest B. Maryellen PileEason, MD Darrick MeigsSANGEETA Sharlena Kristensen MD ELECTRONICALLY SIGNED 12/08/2011 17:18

## 2015-01-05 NOTE — H&P (Signed)
PATIENT NAME:  Natalie Knight, Natalie Knight MR#:  [id number] DATE OF BIRTH:  1936/03/05  DATE OF ADMISSION:  12/02/2011   PRIMARY MD: Toy Cookey, MD   REFERRING DOCTOR: Dr. Enedina Finner   CHIEF COMPLAINT: Episode of confusion.   HISTORY OF PRESENT ILLNESS: The patient is a 79 year old African American female with history of hypertension, diabetes, gout, and has chronic back pain who was brought by her daughter earlier in the day. She was eating with her at [employer] where all of a sudden the patient did not know where she was for about 30 minutes. Also, mother attempted to eat her food with her knife, lifted up the plate as if she was looking for something and didn't understand the commands her daughter was trying to give her. She reports that she has had similar type of episodes lasting briefly but this was the longest. The patient did not have any slurred speech. According to the daughter, her symptoms resolved after a half hour. This was the report given earlier when the daughter was here. She left because she had to go to work and I am unable to get in touch with her currently with the number that is in the computer. The patient is a very poor historian and she is not able to give me any further history. At this time the patient knows that she is in the Emergency Department but does not know what year it is. She does not know who the president is. She is currently denying any chest pain. No shortness of breath. No abdominal pain or nausea. No urinary symptoms. She is denying any asymmetrical weakness, numbness, or tingling. Denies any speech difficulties. Again, history is limited due to the patient being a very poor historian.   PAST MEDICAL HISTORY:  1. History of sciatica.  2. Gout.  3. Diabetes, type II.  4. Hypertension.   PAST SURGICAL HISTORY:  1. Status post hemorrhoidectomy.  2. Status post hysterectomy.   CURRENT MEDICATIONS: We do not have an up-to-date medication list. Unfortunately, I am not  able to get in touch with her daughter. I have called her on the number left and we will have to obtain the list in the morning.   SOCIAL HISTORY: The patient dips snuff. No alcohol or drug use. According to her she lives by herself.   FAMILY HISTORY: Positive for hypertension.  REVIEW OF SYSTEMS: Review of systems again limited due to the patient being a poor historian. CONSTITUTIONAL: Denies any fevers, fatigue. No weakness. No pain. No weight loss. No weight gain. EYES: No blurred or double vision. No redness. No inflammation. No glaucoma. No cataracts. ENT: No tinnitus. No ear pain. No hearing loss. No seasonal or year-round allergies. No epistaxis. No nasal discharge. No snoring. No postnasal drip. No difficulty swallowing. RESPIRATORY: Denies any cough, wheezing, hemoptysis. No COPD. No TB. No pneumonia. CARDIOVASCULAR: No chest pain. No orthopnea. No edema. No arrhythmias. No palpitations. No syncope. GI: No nausea, vomiting, diarrhea. No abdominal pain. No hematemesis. No melena. No changes in bowel habits. GU: Denies any dysuria, hematuria, renal calculus, or frequency. ENDOCRINE: Denies any polyuria, nocturia, or thyroid problems. HEME/LYMPH: Denies any anemia, easy bruisability, or swollen glands. SKIN: No acne. No rash. No changes in mole, hair or skin. MUSCULOSKELETAL: Has chronic back pain and leg pain. NEUROLOGIC: No numbness. No weakness. No CVA or TIA in the past. No diagnosis of dementia. PSYCHIATRIC: No anxiety. No insomnia. No ADD. No OCD.   PHYSICAL EXAMINATION:  VITAL SIGNS: Temperature 98.1, pulse 72, respirations 18, blood pressure 191/88, O2 97%.   GENERAL: The patient is an elderly African American female in no acute distress.   HEENT: Head atraumatic, normocephalic. Pupils equal, round, reactive to light and accommodation. Extraocular movements intact. There is no conjunctival pallor. No scleral icterus. Oropharynx is clear without any exudates. Nasal exam shows no drainage  or ulceration. Ear exam shows no erythema.   NECK: There is no thyromegaly. No carotid bruits.   CARDIOVASCULAR: Regular rate and rhythm. No murmurs, rubs, clicks, or gallops. PMI is not displaced.   LUNGS: Clear to auscultation bilaterally without any rales, rhonchi, or wheezing.   ABDOMEN: Soft, nontender, nondistended. Positive bowel sounds x4.   EXTREMITIES: No clubbing, cyanosis, or edema.   NEUROLOGICAL: The patient is oriented to place but not time. She does not know who the president is. She does intermittently get confused. When asked about something, she answers regarding a different topic. Otherwise, strength is 5 out of 5. Cranial nerves II through XII grossly intact. Reflexes 2+. Babinski downgoing.   VASCULAR: Good DP and PT pulses.   SKIN: No rash.   MUSCULOSKELETAL: There is no erythema or swelling.   EVALUATIONS IN THE ED: Her CT scan of the head shows no acute abnormality. Urinalysis is nitrite negative, leukocytes negative. WBC 9.3, hemoglobin 12.7, platelet count 115, glucose 136, BUN 14, creatinine 0.90, sodium 140, potassium 3.6, chloride 106, CO2 27, calcium 9.5, bilirubin total 0.5. The rest of the LFTs are normal. EKG showed sinus rhythm with sinus arrhythmia.   ASSESSMENT AND PLAN: The patient is a 79 year old African American female brought in by daughter due to episode of confusion lasting longer than usual.  1. Episode of confusion lasting 30 minutes. Head CT scan is negative. Urinalysis negative. The patient is afebrile so there is no evidence of any metabolic derangements. It could be a TIA although very unlikely with the presentation. There is question whether the patient may have dementia. Unfortunately, I was unable to get in touch with the daughter regarding further history. This will need to be discussed further with her daughter about what her mentation is normally like. At this time will place the patient on observation. Treat her with aspirin. Check  carotid Doppler's. Echocardiogram. Check a fasting lipid panel in the a.m.  2. Diabetes, type II. Will place her on sliding scale insulin for now. Obtain home medication list.  3. Hypertension. Again, I am not sure what medication she's on. Her blood pressure is elevated. Will do hydralazine p.r.n.  4. Gout. Resume home medications when the list is available.  5. Miscellaneous. Will place her on Lovenox for DVT prophylaxis.   TIME SPENT: 35 minutes.   ____________________________ Lacie ScottsShreyang H. Allena KatzPatel, MD shp:drc D: 12/02/2011 00:20:49 ET T: 12/02/2011 06:33:58 ET JOB#: [id number]  cc: Avnoor Koury H. Allena KatzPatel, MD, <Dictator> Serita ShellerErnest B. Maryellen PileEason, MD Charise CarwinSHREYANG H Damon Hargrove MD ELECTRONICALLY SIGNED 12/02/2011 6:59

## 2015-01-12 NOTE — Consult Note (Signed)
PATIENT NAME:  Natalie Knight, [name] MR#:  [id number] DATE OF BIRTH:  1936/07/31  DATE OF CONSULTATION:  10/03/2014  REFERRING PHYSICIAN:   CONSULTING PHYSICIAN:  Audery AmelJohn T. Daylin Eads, MD  IDENTIFYING INFORMATION AND REASON FOR CONSULT: This is a 79 year old woman with a history of a diagnosis of dementia in the past who was admitted to the hospital for severe constipation and fecal impaction. Consult for mental status changes.   HISTORY OF PRESENT ILLNESS: Information obtained from the patient and the chart. Also discussed the case with the hospitalist and with the patient's daughter. The patient came into the hospital with mental status changes and was found to be severely constipated. Required fecal disimpaction. The patient's mental state has improved somewhat over the last couple of days. She was very confused and disoriented but that seems to have improved. Daughter reports that the patient is still not back to her baseline, but is much better than she was the last couple of days. The patient's daughter states that the baseline was that the patient still had memory problems and confusion intermittently, but had been able to do a reasonable job doing at least partial care for herself independently at home. The patient herself is not able to offer much history. She does not remember why she came into the hospital. Does not have specific complaints at this time.   PAST PSYCHIATRIC HISTORY: The patient's daughter believes that the patient has had a psychiatric hospitalization before, says it was a few years ago when the patient had a stroke and claims that it was here at our hospital. I do not see any record of that in the computer, so I am not sure what she is referring to. I do see that she has been diagnosed with depression and has been treated with an antidepressant and was on Cymbalta when she came into the hospital 30 mg a day. No other known history of psychiatric hospitalization. No history of suicide  attempts known.   FAMILY HISTORY: Daughter says that she has had psychotic symptoms in the past.   SOCIAL HISTORY: The patient had been living independently. Daughter seems to have been providing a great deal of help. The plan at discharge is for the patient to go to rehabilitation. I am sure the question will arise whether she is able to go back to independent living once she has been at rehabilitation for a while.   PAST MEDICAL HISTORY:  Type 2 diabetes, history of stroke, current fecal impaction with severe constipation. Diagnosed history of dementia in the past.   SUBSTANCE ABUSE HISTORY: No known history of alcohol or drug abuse identified.   MEDICATIONS ON ADMISSION:  At the time of admission she was on hydrocodone 5 mg q. 6 hours as needed for pain, aspirin 81 mg a day, Vimovo 500/20 twice a day, metformin 500 mg twice a day, simvastatin 20 mg at night, Norvasc 5 mg a day, Exelon patch 4.6 mg daily, Colace 100 mg 2 of them a day, mag citrate 300 mL daily as needed for constipation, MiraLax as needed for constipation, vitamin B12.   ALLERGIES:  LEVAQUIN.   REVIEW OF SYSTEMS: The patient denies any pain currently. Does not have any physical or mental symptoms right now.   MENTAL STATUS EXAMINATION: Disheveled woman, looks older than her stated age. Passively cooperative. Falls asleep frequently during the interview. Often during emotionally charged moments of the conversation. Eye contact intermittent. Psychomotor activity very slow and sluggish. Speech very slow and decreased in  amount and quiet. Affect flat. Mood stated as being not so good. Thoughts were slow, somewhat disorganized. No obviously bizarre thoughts. The patient knew she was in a hospital and knew she was in [place]. She could not name the year, although she knew the month. Did not know the day of the week. She was not able to spell a word backwards at all. She could repeat 3 words only after several tries and could not  remember any of them at 2-3 minutes. The patient states that her mood is frequently down. She endorses depressive symptoms and negative symptoms. Denies suicidal intent, but says that she does frequently have thoughts about just giving up and wishing that she were not here anymore. Denies hallucinations. Judgment and insight are probably impaired to some degree by dementia.   LABORATORY RESULTS: Glucoses continue to run high, high 100s to low 200s. Chloride slightly elevated at 108. CBC largely unremarkable. KUB showed fecal impaction.   VITAL SIGNS: Currently blood pressure 140/65, respirations 19, pulse 107, temperature 98.   ASSESSMENT: This is a 79 year old woman with a history of dementia, who has had worsening mental status changes. She has evidence of dementia to exam, but she also is endorsing depressive symptoms. Her Cymbalta was discontinued on admission as was her narcotic pain medicine. Right now I do not see any reason for the narcotic pain medicine, but it does sound like she probably still needs an antidepressant.   TREATMENT PLAN: She had some elevated liver function tests on her admission laboratories and so I will not continue the Cymbalta. Cymbalta is usually not indicated with any degree of liver dysfunction. Instead I will restart her on an antidepressant with Lexapro 10 mg a day. The patient and daughter agreeable. Agree with staying off of narcotic pain medicines as they were probably a major problem with the constipation. The patient should have followup with evaluation of her depression in rehabilitation.   DIAGNOSIS PRINCIPAL AND PRIMARY:  AXIS I: Depression major, recurrent.   SECONDARY DIAGNOSES:  AXIS I: Dementia, probably Alzheimer's and vascular.   AXIS II: Deferred.   AXIS III:  1.  Recent delirium from fecal impaction, resolving.  2.  Diabetes.     ____________________________ Audery Amel, MD jtc:bu D: 10/03/2014 16:01:45 ET T: 10/03/2014 16:10:53  ET JOB#: [id number]  cc: Audery Amel, MD, <Dictator> Audery Amel MD ELECTRONICALLY SIGNED 10/23/2014 17:19

## 2015-01-12 NOTE — Consult Note (Signed)
Chief Complaint:  Subjective/Chief Complaint Pt denies any pain.  Had a good BM this AM.  Denis nausea & vomiting.   VITAL SIGNS/ANCILLARY NOTES: **Vital Signs.:   18-Jan-16 08:15  Vital Signs Type Q 8hr  Temperature Temperature (F) 98.1  Celsius 36.7  Temperature Source oral  Pulse Pulse 62  Respirations Respirations 18  Systolic BP Systolic BP 245  Diastolic BP (mmHg) Diastolic BP (mmHg) 73  Mean BP 103  Pulse Ox % Pulse Ox % 98  Pulse Ox Activity Level  At rest  Oxygen Delivery Room Air/ 21 %   Brief Assessment:  GEN well developed, well nourished, no acute distress, Alert, oriented x1.   Cardiac Regular   Respiratory normal resp effort   Gastrointestinal details normal Soft  Nontender  Nondistended  Bowel sounds normal  No rebound tenderness  No gaurding   EXTR negative cyanosis/clubbing, negative edema   Additional Physical Exam Skin: warm, dry   Lab Results:  Hepatic:  18-Jan-16 03:42   Bilirubin, Total 0.5  Alkaline Phosphatase 95 (46-116 NOTE: New Reference Range 04/02/14)  SGPT (ALT)  78 (14-63 NOTE: New Reference Range 04/02/14)  SGOT (AST) 33  Total Protein, Serum  5.9  Albumin, Serum  2.7  Routine Chem:  18-Jan-16 03:42   Glucose, Serum  132  BUN 14  Creatinine (comp) 0.77  Potassium, Serum  2.7  Chloride, Serum 104  CO2, Serum 28  Calcium (Total), Serum 8.9  Osmolality (calc) 285  eGFR (African American) >60  eGFR (Non-African American) >60 (eGFR values <62mL/min/1.73 m2 may be an indication of chronic kidney disease (CKD). Calculated eGFR, using the MRDR Study equation, is useful in  patients with stable renal function. The eGFR calculation will not be reliable in acutely ill patients when serum creatinine is changing rapidly. It is not useful in patients on dialysis. The eGFR calculation may not be applicable to patients at the low and high extremes of body sizes, pregnant women, and vegetarians.)  Anion Gap 10  Routine Hem:   18-Jan-16 03:42   WBC (CBC) 9.3  RBC (CBC) 4.62  Hemoglobin (CBC)  10.9  Hematocrit (CBC)  34.4  Platelet Count (CBC) 184  MCV  74  MCH  23.6  MCHC  31.7  RDW  14.7  Neutrophil % 79.4  Lymphocyte % 12.1  Monocyte % 6.8  Eosinophil % 1.3  Basophil % 0.4  Neutrophil #  7.4  Lymphocyte # 1.1  Monocyte # 0.6  Eosinophil # 0.1  Basophil # 0.0 (Result(s) reported on 30 Sep 2014 at 04:42AM.)   Assessment/Plan:  Assessment/Plan:  Assessment Biliary dilation:  No pain currently.  LFTS improving.  For MRCP today to look for obstruction/choledocholithiasis. Fecal impaction/Constipation: Improved s/p disimpaction.  Good BM this AM.   I have discussed her care with Dr Evangeline Gula [facility] & our plan of care is below.   Plan 1) MRCP today 2) Continue supportive measures 3) Further recommendations pending MRCP Please call if you have any questions or concerns   Electronic Signatures for Addendum Section:  Andria Meuse (NP) (Signed Addendum 18-Jan-16 10:10)  Spoke with Dr Ginette Pitman.  OK for MRCP & can give Ativan $RemoveBef'1mg'YOLGMYvojn$  IV prior to MRCP.  Andria Meuse (NP) (Signed Addendum 18-Jan-16 13:56)  I received a phone call from patient's RN stating pt intolerant of be still in MRI despite Ativan.  MRCP cancelled.  I paged Dr Landino Norris & discussed with him.  Pt high risk for ERCP given potential complications with pancreatitis, bleeding,  infection, medication reaction or peforation & comorbid conditions.  ERCP is not recommended at this time.  Suggests continue to follow LFTS & monitor after pain resolves & patient is discharged.   Electronic Signatures: Andria Meuse (NP)  (Signed 18-Jan-16 10:02)  Authored: Chief Complaint, VITAL SIGNS/ANCILLARY NOTES, Brief Assessment, Lab Results, Assessment/Plan   Last Updated: 18-Jan-16 13:56 by Andria Meuse (NP)

## 2015-01-12 NOTE — H&P (Signed)
PATIENT NAME:  Natalie Knight, Natalie Knight MR#:  [id number] DATE OF BIRTH:  1936-06-10  DATE OF ADMISSION:  09/29/2014  REFERRING PHYSICIAN: [place] Sink. Dolores Frame, MD  PRIMARY CARE PHYSICIAN: Barbette Reichmann, MD, [facility]   CHIEF COMPLAINT: Constipation, abdominal pain.   HISTORY OF PRESENT ILLNESS: This is a 79 year old African American female with history of type 2 diabetes, non-insulin-requiring, uncomplicated; as well as Alzheimer dementia, presenting with abdominal pain and constipation. She is unable to provide any meaningful information given the patient's mental status. History obtained from family who are present at bedside. She was recently evaluated at [facility] Emergency Department for constipation and received a Fleet enema with response and then discharged; however, while at home, constipation remained an issue. She was given a suppository, having some loose stool after the suppository; however, by this point, the patient is now complaining of abdominal pain, though unable to further quantify her pain given her mental status at baseline. Per family, she also becomes "not herself" and she is more confused than usual.   EMERGENCY DEPARTMENT COURSE: She underwent evaluation, including CAT scan of the abdomen and pelvis revealing fecal impaction. Received Fleet enema in the Emergency Department with minimal response. Had evaluation by general surgery.   REVIEW OF SYSTEMS: Unable to obtain given the patient's mental status and medical condition.   PAST MEDICAL HISTORY: Type 2 diabetes, non-insulin-requiring, uncomplicated; hypertension, essential; Alzheimer dementia.   SOCIAL HISTORY: No alcohol, tobacco, or drug usage. Uses a walker for ambulation. Baseline mental status apparently is somewhat conversant; however, in only short 1 to 3 word answers.   FAMILY HISTORY: Hypertension.   ALLERGIES: LEVAQUIN.   HOME MEDICATIONS: Include acetaminophen/hydrocodone 325/5 mg p.o. q. 6  hours as needed for pain, aspirin 81 mg p.o. daily, Vimovo 500/20 mg p.o. b.i.d., metformin 500 mg p.o. b.i.d., simvastatin 20 mg p.o. at bedtime, Norvasc 5 mg p.o. daily, Exelon patch 4.6 mg 1 patch daily, Colace 100 mg 2 capsules daily, magnesium citrate 300 mL daily as needed for constipation, MiraLax 17 grams daily as needed for constipation, vitamin B12 at 500 mcg 2 tablets daily, Glucerna shakes 3 times daily with meals.   PHYSICAL EXAMINATION:  VITAL SIGNS: Temperature 98.1, heart rate 88, respirations 20, blood pressure 112/62, saturating 95% on room air. Weight 70.3 kg, BMI 27.5.  GENERAL: Weak-appearing African American female, currently in no acute distress.  HEAD: Normocephalic, atraumatic.  EYES: Pupils equal, round, react to light. Extraocular muscles intact. No scleral icterus.  MOUTH: Dry mucosal membranes. Dentition intact. No abscess noted.  EARS, NOSE, THROAT: Clear without exudates. No external lesions.  NECK: Supple. No thyromegaly. No nodules. No JVD.  PULMONARY: Clear to auscultation bilaterally without wheezes, rubs, or rhonchi. No use of accessory muscles. Good respiratory effort.  CHEST: Nontender to palpation.  CARDIOVASCULAR: S1, S2. Regular rate and rhythm. No murmurs, rubs, or gallops. No edema. Pedal pulses 2+ bilaterally.  GASTROINTESTINAL: Soft. Minimal distention. Nontender. Hypoactive bowel sounds. No hepatosplenomegaly.  MUSCULOSKELETAL: No swelling, clubbing, or edema. Range of motion full in all extremities.  NEUROLOGIC: Cranial nerves II through XII intact. No gross focal neurologic deficits. Sensation intact. Reflexes intact.  SKIN: No ulcerations, lesions, rashes, or cyanosis. Skin warm, dry. Turgor intact.  PSYCHIATRIC: Mood and affect flattened. She is awake, oriented to herself only. Able to follow some simple commands, which is somewhat worse than her baseline per family at bedside. Insight and judgment appear to be poor.   LABORATORY DATA: CT abdomen  and pelvis  reveals a large dense stool ball at the rectum measuring 7.7 cm in transverse diameter and about 10 cm caudocranial dimension, followed by a longer dense structure with stool filling the distal sigmoid colon measuring 22.3 x 2.6 x 6.2 cm. There is also mention of problems of intrahepatic biliary duct dilatation with common hepatic duct and mild calcification of the gallbladder wall near the fundus. Remainder of laboratory data: Sodium 142, potassium 4.1, chloride 103, bicarbonate of 30, BUN 22, creatinine 0.97, glucose 231. LFTs: Alkaline phosphatase 132, AST 60, ALT 154. WBC 16, hemoglobin 11.6, platelets of 210,000. Urinalysis: Negative for evidence of infection.   ASSESSMENT AND PLAN: A 79 year old [place] female with a history of Alzheimer dementia, as well as type 2 diabetes, uncomplicated, presenting with abdominal pain and constipation, found to have a large fecal impaction.  1.  Fecal impaction: As stated above large dense stool ball at the rectum 7.7 x 10 cm, followed by a larger area of 22 x 2.6 x 6.2 cm. The patient was actually evaluated by surgery in the Emergency Department who recommended no further oral agents for bowel regimen, concerned for perforation; however, to continue with enemas to see if there is any response. We will consult gastroenterology for potential need for colonoscopy versus flexible sigmoidoscopy for the potential to snare fragment of fecal material.  2.  Encephalopathy: This is most likely a combination of dementia complicated by constipation. No evidence of infection at this time. Continue with her Exelon patch.  3.  Type 2 diabetes, uncomplicated: Hold oral agents. Add insulin sliding scale with every 6 hour Accu-Cheks.  4.  Hypertension: We will hold oral agents once again and continue with as needed hydralazine.  5. Venous thromboembolism prophylaxis with sequential compression devices.  CODE STATUS: The patient is FULL CODE.  TIME SPENT: 45  minutes.     ___________________________ Cletis Athensavid K. Taran Haynesworth, MD dkh:ts D: 09/29/2014 02:23:22 ET T: 09/29/2014 03:11:20 ET JOB#: [id number]  cc: Cletis Athensavid K. Juanisha Bautch, MD, <Dictator> Ryken Paschal Synetta ShadowK Damaris Abeln MD ELECTRONICALLY SIGNED 09/30/2014 1:05

## 2015-01-12 NOTE — Consult Note (Signed)
Chief Complaint:  Subjective/Chief Complaint Pt denies any pain.  Having good BMs today after enemas.  Denies nausea & vomiting.   VITAL SIGNS/ANCILLARY NOTES: **Vital Signs.:   19-Jan-16 10:23  Vital Signs Type Recheck  Temperature Temperature (F) 98.1  Celsius 36.7  Pulse Pulse 71  Respirations Respirations 18  Systolic BP Systolic BP 269  Diastolic BP (mmHg) Diastolic BP (mmHg) 85  Pulse Ox % Pulse Ox % 100  Pulse Ox Activity Level  At rest  Oxygen Delivery Room Air/ 21 %    12:00  Vital Signs Type Recheck  Systolic BP Systolic BP 485  Diastolic BP (mmHg) Diastolic BP (mmHg) 83  Mean BP 112   Brief Assessment:  GEN well developed, well nourished, no acute distress, Alert, oriented x1. Daughter at bedside.   Cardiac Regular   Respiratory normal resp effort   Gastrointestinal details normal Soft  Nontender  Nondistended  Bowel sounds normal  No rebound tenderness  No gaurding   EXTR negative cyanosis/clubbing, negative edema   Additional Physical Exam Skin: warm, dry   Lab Results: Hepatic:  19-Jan-16 10:00   Bilirubin, Total 0.5  Alkaline Phosphatase 87 (46-116 NOTE: New Reference Range 04/02/14)  SGPT (ALT) 63 (14-63 NOTE: New Reference Range 04/02/14)  SGOT (AST)  48  Total Protein, Serum  6.1  Albumin, Serum  2.6  Routine Chem:  19-Jan-16 10:00   Glucose, Serum  124  BUN 7  Creatinine (comp) 0.64  Sodium, Serum 139  Potassium, Serum 4.9  Chloride, Serum 105  CO2, Serum 22  Calcium (Total), Serum 8.5  Osmolality (calc) 277  eGFR (African American) >60  eGFR (Non-African American) >60 (eGFR values <74m/min/1.73 m2 may be an indication of chronic kidney disease (CKD). Calculated eGFR, using the MRDR Study equation, is useful in  patients with stable renal function. The eGFR calculation will not be reliable in acutely ill patients when serum creatinine is changing rapidly. It is not useful in patients on dialysis. The eGFR calculation may not be  applicable to patients at the low and high extremes of body sizes, pregnant women, and vegetarians.)  Result Comment POTASSIUM/AST/CREATININE - Slight hemolysis, interpret results with BUN/TOTAL PROTEIN - caution.  Result(s) reported on 01 Oct 2014 at 10:27AM.  Result Comment PLATELETS - SLIGHT PLATELET CLUMPING IN SPECIMEN. ACTUAL  - NUMERICAL COUNT MAY BE SOMEWHAT HIGHER THAN  - THE REPORTED VALUE.  Result(s) reported on 01 Oct 2014 at 10:27AM.  Anion Gap 12  Routine Hem:  19-Jan-16 10:00   WBC (CBC) 8.4  RBC (CBC) 4.73  Hemoglobin (CBC)  11.0  Hematocrit (CBC) 35.1  Platelet Count (CBC) 203  MCV  74  MCH  23.2  MCHC  31.3  RDW  15.0  Neutrophil % 76.0  Lymphocyte % 13.8  Monocyte % 7.9  Eosinophil % 1.4  Basophil % 0.9  Neutrophil # 6.4  Lymphocyte # 1.2  Monocyte # 0.7  Eosinophil # 0.1  Basophil # 0.1   Assessment/Plan:  Assessment/Plan:  Assessment Biliary dilation:  No pain currently.  LFTS improving, follow to baseline.  Discussed with daughter failed MRCP attempt & risks of ERCP.  She understands malignancy may be missed & agrees with waiting at this time. Fecal impaction/Constipation: Improved s/p disimpaction.  Good BM this AM.   [facility] our plan of care is below.   Plan No new recommendations WIll sign off Please call if you have any questions or concerns  Electronic Signatures: Andria Meuse (NP)  (Signed 19-Jan-16 14:55)  Authored: Chief Complaint, VITAL SIGNS/ANCILLARY NOTES, Brief Assessment, Lab Results, Assessment/Plan   Last Updated: 19-Jan-16 14:55 by Andria Meuse (NP)

## 2015-01-12 NOTE — Consult Note (Signed)
Brief Consult Note: Diagnosis: depression and dementia.   Patient was seen by consultant.   Consult note dictated.   Recommend further assessment or treatment.   Orders entered.   Discussed with Attending MD.   Comments: Psychiatry: Patient seen and chart reviewed and daughter interviewed. REstart antidepressant with lexapro 10mg  daily. See full note.  Electronic Signatures: Audery Amellapacs, John T (MD)  (Signed 21-Jan-16 14:08)  Authored: Brief Consult Note   Last Updated: 21-Jan-16 14:08 by Audery Amellapacs, John T (MD)

## 2015-01-12 NOTE — Consult Note (Signed)
Brief Consult Note: Diagnosis: Patient with stool impaction and dilated CBD.   Patient was seen by consultant.   Consult note dictated.   Comments: I preformed manual disimpaction on the patient today with good results. Increase enemas to bid and continue oral laxatives. Will also add lactulose.  Dilated CBD with increased LFT's but no increased bili. ERCP vs. MRCP? Will talk to family.  Electronic Signatures: Midge MiniumWohl, Josefine Fuhr (MD)  (Signed 17-Jan-16 09:09)  Authored: Brief Consult Note   Last Updated: 17-Jan-16 09:09 by Midge MiniumWohl, Mazikeen Hehn (MD)

## 2015-01-12 NOTE — Discharge Summary (Signed)
PATIENT NAME:  Natalie Knight, Natalie Knight MR#:  [id number] DATE OF BIRTH:  1935/10/31  DATE OF ADMISSION:  09/29/2014 DATE OF DISCHARGE:    DIAGNOSES AT TIME OF DISCHARGE:  1.  Fecal impaction.  2.  Encephalopathy secondary to vascular  dementia and constipation.  3.  Type 2 diabetes.  4.  Hypertension.  5   Depression  CHIEF COMPLAINT: Constipation, abdominal pain.   HISTORY OF PRESENT ILLNESS: Natalie Knight is a 79 year old African American female with a history of type 2 diabetes, Alzheimer's type dementia who presented to the ED complaining of abdominal pain and constipation. The patient has recently been seen in the ED for a similar problem and had received Fleets enema with some response but continued to experience abdominal pain and also had become more confused.   PAST MEDICAL HISTORY:  Significant for type 2 diabetes, hypertension, and Alzheimer's disease. Please see H and Knight for full details.   HOSPITAL COURSE: [facility] and underwent a CT of the abdomen and pelvis which showed large dense stool ball of the rectum, measuring 7.7 cm in the transverse dimension, approximately 10 cm in craniocaudal dimension.  There was liquid stool surrounding the dense stool balls and prominence of intrahepatic ducts and dilatation of the common hepatic duct to 1.2 cm.  An MRCP was ordered but the patient was unable to lie still for this procedure. She was also seen by GI for this reason. CT scan did not show any acute intracranial abnormality. During her stay in the hospital, the patient had episodes of confusion with behavioral disturbance from her dementia and also had elevations of blood pressure for which she received Knight.r.n. hydralazine and subsequently started on Losartan 100 mg a day. She was seen by physical therapy and felt that she would benefit from rehab.   She was transferred in stable condition on the following medications:  Losartan 100 mg once a day, acetaminophen 325 mg q.4 hours  Knight.r.n. for fever or pain rivastigmine 4.6 mg every 24 hours, fish oil capsule 1 capsule t.i.d., Centrum Silver 1 tab once a day, Mag-Ox 400 mg once a day, Zyrtec 10 mg once a day, Lexapro 10 mg po qd, metformin 5 mg Knight.o. b.i.d., aspirin 81 mg a day, docusate sodium 100 mg 2 capsules once a day, vitamin B12 1000 mcg a day, simvastatin 20 mg once a day.   DISCHARGE INSTRUCTIONS: The patient was advised to follow with me, Dr. Marcello FennelHande, in 2 to 4 weeks' time.   DISCHARGE CONDITION:  She was stable at the time of discharge.   Total time spent in discharge of this pt; 35 minutes ____________________________ Barbette ReichmannVishwanath Rolen Conger, MD vh:at D: 10/03/2014 13:35:45 ET T: 10/03/2014 13:51:38 ET JOB#: [id number]  cc: Barbette ReichmannVishwanath Shaniqwa Horsman, MD, <Dictator> Barbette ReichmannVISHWANATH Joselynn Amoroso MD ELECTRONICALLY SIGNED 10/03/2014 17:45

## 2015-01-12 NOTE — Consult Note (Signed)
PATIENT NAME:  Natalie Knight, Natalie Knight MR#:  [id number] DATE OF BIRTH:  1935/11/13  DATE OF CONSULTATION:  09/29/2014  CONSULTING PHYSICIAN:  Midge Miniumarren Chastin Garlitz, MD  CONSULTING SERVICE: Gastroenterology.   REASON FOR CONSULTATION: Constipation and abdominal pain.   HISTORY OF PRESENT ILLNESS: This patient is a 79 year old woman who comes in with inability to pass stools. The patient had an ultrasound of her right upper quadrant that showed a dilated common bile duct with gallstones and a CT scan that showed a large fecal ball in the rectum with a large amount of stool in the colon. The patient's daughter, who is with the patient today, states that she suffers from Alzheimer's, but has been complaining of inability to pass the stool past her rectum. She was evaluated in the Emergency Department and received some Fleet enemas and constipation remained an issue. The patient is on MiraLax every day. The patient is unable to give much of a history due to her Alzheimer's.   PAST MEDICAL HISTORY: Type 2 diabetes, hypertension, and Alzheimer's.   SOCIAL HISTORY: No tobacco, alcohol, or drug use.   FAMILY HISTORY: Noncontributory.   ALLERGIES: LEVAQUIN.  REVIEW OF SYSTEMS: Unable to obtain with patient's mental status.   HOME MEDICATIONS:  1.  Acetaminophen/oxycodone. 2.  Aspirin. 3.  Vimovo. 4.  Metformin. 5.  Simvastatin. 6.  Norvasc.  7.  Colace.  8.  Magnesium citrate. 9.  MiraLax.   PHYSICAL EXAMINATION:  GENERAL: The patient is sitting in bed in no apparent distress.  VITAL SIGNS: Temperature 98.2, pulse 73, respirations 16, blood pressure 154/82, pulse oximetry 96% on room air.  HEENT: Normocephalic, atraumatic. Extraocular motor intact. Pupils equally round and reactive to light and accommodation.  NECK: Without JVD, without lymphadenopathy.  LUNGS: Clear to auscultation bilaterally.  HEART: Regular rate and rhythm without murmurs, rubs, or gallops.  ABDOMEN: Soft, nontender, nondistended,  without hepatosplenomegaly, without rebound, without guarding.  EXTREMITIES: Without cyanosis, clubbing, or edema.  NEUROLOGICAL: Grossly intact.  SKIN: Without any rashes or lesions.  PSYCHIATRIC: The patient is alert and awake, but not oriented to place or time.   ANCILLARY SERVICES: CT scan as stated above with a large dense stool ball in the rectum and intrahepatic ductal dilatation on ultrasound with common bile duct being 1.5 cm with calcification of the gallbladder wall. The LFTs were also elevated with bilirubin being normal at 0.5, alkaline phosphatase of 132, AST of 60, ALT of 154.   ASSESSMENT AND PLAN: This patient is a 79 year old woman who has stool impaction, which was manually disimpacted at bedside by myself. The patient's enemas have been increased to twice a day and she will be started on lactulose to help clear some of the stools out. The patient should undergo an MRCP due to the low risk of the MRI compared to the patient undergoing an ERCP with the risk of pancreatitis without signs of obstruction due to a normal bilirubin. I have discussed the plan with the daughter and she agrees with the plan.   Thank you very much for involving me in the care of this patient. If you have any questions, please do not hesitate to call. We will follow the patient along with you.   ____________________________ Midge Miniumarren Dannon Perlow, MD dw:TT D: 09/29/2014 10:46:00 ET T: 09/29/2014 16:56:43 ET JOB#: [id number]  cc: Midge Miniumarren Daaiyah Baumert, MD, <Dictator> Midge MiniumARREN Jordanny Waddington MD ELECTRONICALLY SIGNED 10/01/2014 7:12

## 2015-08-14 DIAGNOSIS — H903 Sensorineural hearing loss, bilateral: Secondary | ICD-10-CM | POA: Diagnosis not present

## 2015-08-14 DIAGNOSIS — H6123 Impacted cerumen, bilateral: Secondary | ICD-10-CM | POA: Diagnosis not present

## 2015-10-23 DIAGNOSIS — E119 Type 2 diabetes mellitus without complications: Secondary | ICD-10-CM | POA: Diagnosis not present

## 2015-10-23 DIAGNOSIS — H40003 Preglaucoma, unspecified, bilateral: Secondary | ICD-10-CM | POA: Diagnosis not present

## 2015-11-27 DIAGNOSIS — E119 Type 2 diabetes mellitus without complications: Secondary | ICD-10-CM | POA: Diagnosis not present

## 2015-11-27 DIAGNOSIS — M79672 Pain in left foot: Secondary | ICD-10-CM | POA: Diagnosis not present

## 2015-11-27 DIAGNOSIS — S92342A Displaced fracture of fourth metatarsal bone, left foot, initial encounter for closed fracture: Secondary | ICD-10-CM | POA: Diagnosis not present

## 2015-11-27 DIAGNOSIS — Y92099 Unspecified place in other non-institutional residence as the place of occurrence of the external cause: Secondary | ICD-10-CM | POA: Diagnosis not present

## 2015-11-27 DIAGNOSIS — I10 Essential (primary) hypertension: Secondary | ICD-10-CM | POA: Diagnosis not present

## 2015-11-27 DIAGNOSIS — S92352A Displaced fracture of fifth metatarsal bone, left foot, initial encounter for closed fracture: Secondary | ICD-10-CM | POA: Diagnosis not present

## 2015-11-27 DIAGNOSIS — W19XXXA Unspecified fall, initial encounter: Secondary | ICD-10-CM | POA: Diagnosis not present

## 2015-11-27 DIAGNOSIS — Z23 Encounter for immunization: Secondary | ICD-10-CM | POA: Diagnosis not present

## 2015-11-27 DIAGNOSIS — F0150 Vascular dementia without behavioral disturbance: Secondary | ICD-10-CM | POA: Diagnosis not present

## 2015-12-04 DIAGNOSIS — E1140 Type 2 diabetes mellitus with diabetic neuropathy, unspecified: Secondary | ICD-10-CM | POA: Diagnosis not present

## 2015-12-04 DIAGNOSIS — S91119A Laceration without foreign body of unspecified toe without damage to nail, initial encounter: Secondary | ICD-10-CM | POA: Diagnosis not present

## 2015-12-04 DIAGNOSIS — S92512A Displaced fracture of proximal phalanx of left lesser toe(s), initial encounter for closed fracture: Secondary | ICD-10-CM | POA: Diagnosis not present

## 2015-12-11 DIAGNOSIS — S92512D Displaced fracture of proximal phalanx of left lesser toe(s), subsequent encounter for fracture with routine healing: Secondary | ICD-10-CM | POA: Diagnosis not present

## 2015-12-11 DIAGNOSIS — M79672 Pain in left foot: Secondary | ICD-10-CM | POA: Diagnosis not present

## 2015-12-11 DIAGNOSIS — S91119D Laceration without foreign body of unspecified toe without damage to nail, subsequent encounter: Secondary | ICD-10-CM | POA: Diagnosis not present

## 2015-12-11 DIAGNOSIS — S91312A Laceration without foreign body, left foot, initial encounter: Secondary | ICD-10-CM | POA: Diagnosis not present

## 2016-01-01 DIAGNOSIS — M79672 Pain in left foot: Secondary | ICD-10-CM | POA: Diagnosis not present

## 2016-01-01 DIAGNOSIS — S92512A Displaced fracture of proximal phalanx of left lesser toe(s), initial encounter for closed fracture: Secondary | ICD-10-CM | POA: Diagnosis not present

## 2016-01-29 DIAGNOSIS — S92512A Displaced fracture of proximal phalanx of left lesser toe(s), initial encounter for closed fracture: Secondary | ICD-10-CM | POA: Diagnosis not present

## 2016-02-02 ENCOUNTER — Emergency Department: Payer: Commercial Managed Care - HMO

## 2016-02-02 ENCOUNTER — Encounter: Payer: Self-pay | Admitting: Emergency Medicine

## 2016-02-02 ENCOUNTER — Emergency Department
Admission: EM | Admit: 2016-02-02 | Discharge: 2016-02-02 | Disposition: A | Discharge status: Home / Self Care | Payer: Commercial Managed Care - HMO | Attending: Emergency Medicine | Admitting: Emergency Medicine

## 2016-02-02 DIAGNOSIS — R410 Disorientation, unspecified: Secondary | ICD-10-CM | POA: Diagnosis not present

## 2016-02-02 DIAGNOSIS — E119 Type 2 diabetes mellitus without complications: Secondary | ICD-10-CM | POA: Diagnosis not present

## 2016-02-02 DIAGNOSIS — R4182 Altered mental status, unspecified: Secondary | ICD-10-CM | POA: Diagnosis not present

## 2016-02-02 DIAGNOSIS — I10 Essential (primary) hypertension: Secondary | ICD-10-CM | POA: Diagnosis not present

## 2016-02-02 DIAGNOSIS — F17290 Nicotine dependence, other tobacco product, uncomplicated: Secondary | ICD-10-CM | POA: Insufficient documentation

## 2016-02-02 HISTORY — DX: Essential (primary) hypertension: I10

## 2016-02-02 HISTORY — DX: Type 2 diabetes mellitus without complications: E11.9

## 2016-02-02 LAB — COMPREHENSIVE METABOLIC PANEL
ALBUMIN: 4.2 g/dL (ref 3.5–5.0)
ALT: 20 U/L (ref 14–54)
AST: 28 U/L (ref 15–41)
Alkaline Phosphatase: 46 U/L (ref 38–126)
Anion gap: 10 (ref 5–15)
BUN: 27 mg/dL — AB (ref 6–20)
CHLORIDE: 104 mmol/L (ref 101–111)
CO2: 25 mmol/L (ref 22–32)
CREATININE: 0.99 mg/dL (ref 0.44–1.00)
Calcium: 11.1 mg/dL — ABNORMAL HIGH (ref 8.9–10.3)
GFR calc Af Amer: >60 mL/min (ref >60)
GFR, EST NON AFRICAN AMERICAN: 54 mL/min — AB (ref >60)
GLUCOSE: 143 mg/dL — AB (ref 65–99)
POTASSIUM: 3.7 mmol/L (ref 3.5–5.1)
SODIUM: 139 mmol/L (ref 135–145)
Total Bilirubin: 0.5 mg/dL (ref 0.3–1.2)
Total Protein: 7.1 g/dL (ref 6.5–8.1)

## 2016-02-02 LAB — CBC
HEMATOCRIT: 41.6 % (ref 35.0–47.0)
Hemoglobin: 13.3 g/dL (ref 12.0–16.0)
MCH: 24.5 pg — AB (ref 26.0–34.0)
MCHC: 32 g/dL (ref 32.0–36.0)
MCV: 76.5 fL — AB (ref 80.0–100.0)
Platelets: 169 10*3/uL (ref 150–440)
RBC: 5.44 MIL/uL — ABNORMAL HIGH (ref 3.80–5.20)
RDW: 14.1 % (ref 11.5–14.5)
WBC: 8.7 10*3/uL (ref 3.6–11.0)

## 2016-02-02 LAB — URINALYSIS COMPLETE WITH MICROSCOPIC (ARMC ONLY)
BACTERIA UA: NONE SEEN
BILIRUBIN URINE: NEGATIVE
Glucose, UA: NEGATIVE mg/dL
Hgb urine dipstick: NEGATIVE
Ketones, ur: NEGATIVE mg/dL
LEUKOCYTES UA: NEGATIVE
Nitrite: NEGATIVE
PH: 6 (ref 5.0–8.0)
PROTEIN: NEGATIVE mg/dL
SQUAMOUS EPITHELIAL / LPF: NONE SEEN
Specific Gravity, Urine: 1.013 (ref 1.005–1.030)

## 2016-02-02 LAB — TROPONIN I: Troponin I: <0.03 ng/mL (ref <0.031)

## 2016-02-02 MED ORDER — SODIUM CHLORIDE 0.9 % IV BOLUS (SEPSIS)
500.0000 mL | Freq: Once | INTRAVENOUS | Status: AC
  Administered 2016-02-02: 500 mL via INTRAVENOUS

## 2016-02-02 NOTE — ED Provider Notes (Signed)
[facility] Emergency Department Provider Note    ____________________________________________  Time seen: ~1855  I have reviewed the triage vital signs and the nursing notes.   HISTORY  Chief Complaint Altered Mental Status   History limited by: Not Limited   HPI Natalie Knight is a 80 y.o. female who presents to the emergency department today because of concerns for episode of confusion and altered mental status that occurred this morning. The patient herself states that she feels fine. She denies any headache, chest pain abdominal pain. No shortness breath. No recent fevers.     Past Medical History  Diagnosis Date  . Diabetes mellitus without complication (HCC)   . Hypertension     There are no active problems to display for this patient.   History reviewed. No pertinent past surgical history.  No current outpatient prescriptions on file.  Allergies Review of patient's allergies indicates no known allergies.  History reviewed. No pertinent family history.  Social History Social History  Substance Use Topics  . Smoking status: Never Smoker   . Smokeless tobacco: Current User    Types: Snuff  . Alcohol Use: No    Review of Systems  Constitutional: Negative for fever. Cardiovascular: Negative for chest pain. Respiratory: Negative for shortness of breath. Gastrointestinal: Negative for abdominal pain, vomiting and diarrhea. Neurological: Negative for headaches, focal weakness or numbness.  10-point ROS otherwise negative.  ____________________________________________   PHYSICAL EXAM:  VITAL SIGNS: ED Triage Vitals  Enc Vitals Group     BP 02/02/16 1453 125/63 mmHg     Pulse Rate 02/02/16 1453 72     Resp 02/02/16 1453 20     Temp 02/02/16 1453 98.4 F (36.9 C)     Temp Source 02/02/16 1453 Oral     SpO2 02/02/16 1453 99 %     Weight 02/02/16 1453 150 lb (68.04 kg)     Height 02/02/16 1453  (1.702 m)    Constitutional: Alert and oriented to month and place. Well appearing and in no distress. Eyes: Conjunctivae are normal. PERRL. Normal extraocular movements. ENT   Head: Normocephalic and atraumatic.   Nose: No congestion/rhinnorhea.   Mouth/Throat: Mucous membranes are moist.   Neck: No stridor. Hematological/Lymphatic/Immunilogical: No cervical lymphadenopathy. Cardiovascular: Normal rate, regular rhythm.  No murmurs, rubs, or gallops. Respiratory: Normal respiratory effort without tachypnea nor retractions. Breath sounds are clear and equal bilaterally. No wheezes/rales/rhonchi. Gastrointestinal: Soft and nontender. No distention. There is no CVA tenderness. Genitourinary: Deferred Musculoskeletal: Normal range of motion in all extremities. No joint effusions.  No lower extremity tenderness nor edema. Neurologic:  Normal speech and language. Oriented to month and place. Unsure why she was brought to the emergency department. Cannot tell year. Face symmetric. Strength 5/5 in upper and lower extremities. No gross focal neurologic deficits are appreciated.  Skin:  Skin is warm, dry and intact. No rash noted. Psychiatric: Mood and affect are normal. Speech and behavior are normal. Patient exhibits appropriate insight and judgment.  ____________________________________________    LABS (pertinent positives/negatives)  Labs Reviewed  COMPREHENSIVE METABOLIC PANEL - Abnormal; Notable for the following:    Glucose, Bld 143 (*)    BUN 27 (*)    Calcium 11.1 (*)    GFR calc non Af Amer 54 (*)    All other components within normal limits  CBC - Abnormal; Notable for the following:    RBC 5.44 (*)    MCV 76.5 (*)    MCH 24.5 (*)  All other components within normal limits  URINALYSIS COMPLETEWITH MICROSCOPIC ([facility] ONLY) - Abnormal; Notable for the following:    Color, Urine YELLOW (*)    APPearance CLEAR (*)    All other components within normal limits  TROPONIN I  CBG  MONITORING, ED     ____________________________________________   EKG  I, Phineas SemenGraydon Kooper Chriswell, attending physician, personally viewed and interpreted this EKG  EKG Time: 1504 Rate: 71 Rhythm: normal sinus rhythm Axis: normal Intervals: qtc 432 QRS: narrow, q waves V1 ST changes: no st elevation Impression: abnormal ekg  ____________________________________________    RADIOLOGY  CT head IMPRESSION: Stable mild chronic periventricular white matter disease. No acute intracranial findings.  ____________________________________________   PROCEDURES  Procedure(s) performed: None  Critical Care performed: No  ____________________________________________   INITIAL IMPRESSION / ASSESSMENT AND PLAN / ED COURSE  Pertinent labs & imaging results that were available during my care of the patient were reviewed by me and considered in my medical decision making (see chart for details).  Patient presented to the emergency department today because of concerns for episode of confusion. On exam patient appears well. Was unable to state the year but otherwise is oriented. Head CT that has already resulted was normal. Blood work shows a mildly increased calcium however I doubt it would be high enough to cause symptoms of confusion. Will give some fluids. Awaiting urine.  ----------------------------------------- 8:43 PM on 02/02/2016 -----------------------------------------  Urine without any concerning findings. I discussed with the son who feels like the patient is now back to her baseline. He has been with her for some time now. I also discussed with patient and son the finding of the slightly elevated calcium. Again I do not think this it would explain the confusion. I did discuss that they will need to have this rechecked by their primary care in the next couple of days.  ____________________________________________   FINAL CLINICAL IMPRESSION(S) / ED DIAGNOSES  Final  diagnoses:  Altered mental status, unspecified altered mental status type  Hypercalcemia     Note: This dictation was prepared with Dragon dictation. Any transcriptional errors that result from this process are unintentional    Phineas SemenGraydon Chanze Teagle, MD 02/02/16 2044

## 2016-02-02 NOTE — ED Notes (Signed)
Pt presents with daughter in law, who states that pt has been confused today. Pt was normal yesterday when they went to church together. Pt began c/o pain to stomach and legs, and that she experienced blurring of vision for a short period of time. Pt alert & oriented to self. Knows date but not how old she is.

## 2016-02-02 NOTE — Discharge Instructions (Signed)
As we discussed it is important that she follow up with her primary care doctor to recheck her calcium level. It was mildly elevated today. Please seek medical attention for any high fevers, chest pain, shortness of breath, change in behavior, persistent vomiting, bloody stool or any other new or concerning symptoms.   Confusion Confusion is the inability to think with your usual speed or clarity. Confusion may come on quickly or slowly over time. How quickly the confusion comes on depends on the cause. Confusion can be due to any number of causes. CAUSES   Concussion, head injury, or head trauma.  Seizures.  Stroke.  Fever.  Brain tumor.  Age related decreased brain function (dementia).  Heightened emotional states like rage or terror.  Mental illness in which the person loses the ability to determine what is real and what is not (hallucinations).  Infections such as a urinary tract infection (UTI).  Toxic effects from alcohol, drugs, or prescription medicines.  Dehydration and an imbalance of salts in the body (electrolytes).  Lack of sleep.  Low blood sugar (diabetes).  Low levels of oxygen from conditions such as chronic lung disorders.  Drug interactions or other medicine side effects.  Nutritional deficiencies, especially niacin, thiamine, vitamin C, or vitamin B.  Sudden drop in body temperature (hypothermia).  Change in routine, such as when traveling or hospitalized. SIGNS AND SYMPTOMS  People often describe their thinking as cloudy or unclear when they are confused. Confusion can also include feeling disoriented. That means you are unaware of where or who you are. You may also not know what the date or time is. If confused, you may also have difficulty paying attention, remembering, and making decisions. Some people also act aggressively when they are confused.  DIAGNOSIS  The medical evaluation of confusion may include:  Blood and urine  tests.  X-rays.  Brain and nervous system tests.  Analyzing your brain waves (electroencephalogram or EEG).  Magnetic resonance imaging (MRI) of your head.  Computed tomography (CT) scan of your head.  Mental status tests in which your health care provider may ask many questions. Some of these questions may seem silly or strange, but they are a very important test to help diagnose and treat confusion. TREATMENT  An admission to the hospital may not be needed, but a person with confusion should not be left alone. Stay with a family member or friend until the confusion clears. Avoid alcohol, pain relievers, or sedative drugs until you have fully recovered. Do not drive until directed by your health care provider. HOME CARE INSTRUCTIONS  What family and friends can do:  To find out if someone is confused, ask the person to state his or her name, age, and the date. If the person is unsure or answers incorrectly, he or she is confused.  Always introduce yourself, no matter how well the person knows you.  Often remind the person of his or her location.  Place a calendar and clock near the confused person.  Help the person with his or her medicines. You may want to use a pill box, an alarm as a reminder, or give the person each dose as prescribed.  Talk about current events and plans for the day.  Try to keep the environment calm, quiet, and peaceful.  Make sure the person keeps follow-up visits with his or her health care provider. PREVENTION  Ways to prevent confusion:  Avoid alcohol.  Eat a balanced diet.  Get enough sleep.  Take  medicine only as directed by your health care provider.  Do not become isolated. Spend time with other people and make plans for your days.  Keep careful watch on your blood sugar levels if you are diabetic. SEEK IMMEDIATE MEDICAL CARE IF:   You develop severe headaches, repeated vomiting, seizures, blackouts, or slurred speech.  There is  increasing confusion, weakness, numbness, restlessness, or personality changes.  You develop a loss of balance, have marked dizziness, feel uncoordinated, or fall.  You have delusions, hallucinations, or develop severe anxiety.  Your family members think you need to be rechecked.   This information is not intended to replace advice given to you by your health care provider. Make sure you discuss any questions you have with your health care provider.   Document Released: 10/07/2004 Document Revised: 09/20/2014 Document Reviewed: 10/05/2013 Elsevier Interactive Patient Education [employer]

## 2016-02-02 NOTE — ED Notes (Signed)
Patient transported to CT 

## 2016-02-17 DIAGNOSIS — M25561 Pain in right knee: Secondary | ICD-10-CM | POA: Diagnosis not present

## 2016-02-17 DIAGNOSIS — M1711 Unilateral primary osteoarthritis, right knee: Secondary | ICD-10-CM | POA: Diagnosis not present

## 2016-03-26 DIAGNOSIS — I10 Essential (primary) hypertension: Secondary | ICD-10-CM | POA: Diagnosis not present

## 2016-03-26 DIAGNOSIS — M79672 Pain in left foot: Secondary | ICD-10-CM | POA: Diagnosis not present

## 2016-03-26 DIAGNOSIS — Y92099 Unspecified place in other non-institutional residence as the place of occurrence of the external cause: Secondary | ICD-10-CM | POA: Diagnosis not present

## 2016-03-26 DIAGNOSIS — E119 Type 2 diabetes mellitus without complications: Secondary | ICD-10-CM | POA: Diagnosis not present

## 2016-03-26 DIAGNOSIS — W19XXXA Unspecified fall, initial encounter: Secondary | ICD-10-CM | POA: Diagnosis not present

## 2016-03-26 DIAGNOSIS — F0150 Vascular dementia without behavioral disturbance: Secondary | ICD-10-CM | POA: Diagnosis not present

## 2016-04-02 DIAGNOSIS — F0150 Vascular dementia without behavioral disturbance: Secondary | ICD-10-CM | POA: Diagnosis not present

## 2016-04-02 DIAGNOSIS — Z0000 Encounter for general adult medical examination without abnormal findings: Secondary | ICD-10-CM | POA: Diagnosis not present

## 2016-04-02 DIAGNOSIS — I10 Essential (primary) hypertension: Secondary | ICD-10-CM | POA: Diagnosis not present

## 2016-04-02 DIAGNOSIS — D6489 Other specified anemias: Secondary | ICD-10-CM | POA: Diagnosis not present

## 2016-04-02 DIAGNOSIS — D638 Anemia in other chronic diseases classified elsewhere: Secondary | ICD-10-CM | POA: Diagnosis not present

## 2016-04-02 DIAGNOSIS — E1140 Type 2 diabetes mellitus with diabetic neuropathy, unspecified: Secondary | ICD-10-CM | POA: Diagnosis not present

## 2016-04-22 DIAGNOSIS — H401130 Primary open-angle glaucoma, bilateral, stage unspecified: Secondary | ICD-10-CM | POA: Diagnosis not present

## 2016-05-24 DIAGNOSIS — H401130 Primary open-angle glaucoma, bilateral, stage unspecified: Secondary | ICD-10-CM | POA: Diagnosis not present

## 2016-06-15 DIAGNOSIS — H903 Sensorineural hearing loss, bilateral: Secondary | ICD-10-CM | POA: Diagnosis not present

## 2016-08-13 DIAGNOSIS — D6489 Other specified anemias: Secondary | ICD-10-CM | POA: Diagnosis not present

## 2016-08-13 DIAGNOSIS — F0150 Vascular dementia without behavioral disturbance: Secondary | ICD-10-CM | POA: Diagnosis not present

## 2016-08-13 DIAGNOSIS — I10 Essential (primary) hypertension: Secondary | ICD-10-CM | POA: Diagnosis not present

## 2016-08-13 DIAGNOSIS — E1140 Type 2 diabetes mellitus with diabetic neuropathy, unspecified: Secondary | ICD-10-CM | POA: Diagnosis not present

## 2016-08-13 DIAGNOSIS — D638 Anemia in other chronic diseases classified elsewhere: Secondary | ICD-10-CM | POA: Diagnosis not present

## 2016-08-23 DIAGNOSIS — F0150 Vascular dementia without behavioral disturbance: Secondary | ICD-10-CM | POA: Diagnosis not present

## 2016-08-23 DIAGNOSIS — I10 Essential (primary) hypertension: Secondary | ICD-10-CM | POA: Diagnosis not present

## 2016-08-23 DIAGNOSIS — H401130 Primary open-angle glaucoma, bilateral, stage unspecified: Secondary | ICD-10-CM | POA: Diagnosis not present

## 2016-08-23 DIAGNOSIS — D638 Anemia in other chronic diseases classified elsewhere: Secondary | ICD-10-CM | POA: Diagnosis not present

## 2016-08-23 DIAGNOSIS — H401132 Primary open-angle glaucoma, bilateral, moderate stage: Secondary | ICD-10-CM | POA: Diagnosis not present

## 2016-08-23 DIAGNOSIS — D6489 Other specified anemias: Secondary | ICD-10-CM | POA: Diagnosis not present

## 2016-08-23 DIAGNOSIS — E1140 Type 2 diabetes mellitus with diabetic neuropathy, unspecified: Secondary | ICD-10-CM | POA: Diagnosis not present

## 2016-09-08 DIAGNOSIS — F0150 Vascular dementia without behavioral disturbance: Secondary | ICD-10-CM | POA: Diagnosis not present

## 2016-09-08 DIAGNOSIS — E119 Type 2 diabetes mellitus without complications: Secondary | ICD-10-CM | POA: Diagnosis not present

## 2016-09-08 DIAGNOSIS — Z0000 Encounter for general adult medical examination without abnormal findings: Secondary | ICD-10-CM | POA: Diagnosis not present

## 2016-09-08 DIAGNOSIS — E1140 Type 2 diabetes mellitus with diabetic neuropathy, unspecified: Secondary | ICD-10-CM | POA: Diagnosis not present

## 2016-09-08 DIAGNOSIS — D638 Anemia in other chronic diseases classified elsewhere: Secondary | ICD-10-CM | POA: Diagnosis not present

## 2016-09-08 DIAGNOSIS — I10 Essential (primary) hypertension: Secondary | ICD-10-CM | POA: Diagnosis not present

## 2016-09-08 DIAGNOSIS — R29898 Other symptoms and signs involving the musculoskeletal system: Secondary | ICD-10-CM | POA: Diagnosis not present

## 2016-09-24 DIAGNOSIS — H401130 Primary open-angle glaucoma, bilateral, stage unspecified: Secondary | ICD-10-CM | POA: Diagnosis not present

## 2016-12-14 DIAGNOSIS — H903 Sensorineural hearing loss, bilateral: Secondary | ICD-10-CM | POA: Diagnosis not present

## 2016-12-31 DIAGNOSIS — R29898 Other symptoms and signs involving the musculoskeletal system: Secondary | ICD-10-CM | POA: Diagnosis not present

## 2016-12-31 DIAGNOSIS — Z0000 Encounter for general adult medical examination without abnormal findings: Secondary | ICD-10-CM | POA: Diagnosis not present

## 2016-12-31 DIAGNOSIS — D638 Anemia in other chronic diseases classified elsewhere: Secondary | ICD-10-CM | POA: Diagnosis not present

## 2016-12-31 DIAGNOSIS — E119 Type 2 diabetes mellitus without complications: Secondary | ICD-10-CM | POA: Diagnosis not present

## 2016-12-31 DIAGNOSIS — F0150 Vascular dementia without behavioral disturbance: Secondary | ICD-10-CM | POA: Diagnosis not present

## 2016-12-31 DIAGNOSIS — I10 Essential (primary) hypertension: Secondary | ICD-10-CM | POA: Diagnosis not present

## 2016-12-31 DIAGNOSIS — E1140 Type 2 diabetes mellitus with diabetic neuropathy, unspecified: Secondary | ICD-10-CM | POA: Diagnosis not present

## 2017-01-07 DIAGNOSIS — E1140 Type 2 diabetes mellitus with diabetic neuropathy, unspecified: Secondary | ICD-10-CM | POA: Diagnosis not present

## 2017-01-07 DIAGNOSIS — F0150 Vascular dementia without behavioral disturbance: Secondary | ICD-10-CM | POA: Diagnosis not present

## 2017-01-07 DIAGNOSIS — Z0000 Encounter for general adult medical examination without abnormal findings: Secondary | ICD-10-CM | POA: Diagnosis not present

## 2017-01-07 DIAGNOSIS — R2681 Unsteadiness on feet: Secondary | ICD-10-CM | POA: Diagnosis not present

## 2017-01-07 DIAGNOSIS — D638 Anemia in other chronic diseases classified elsewhere: Secondary | ICD-10-CM | POA: Diagnosis not present

## 2017-01-07 DIAGNOSIS — I10 Essential (primary) hypertension: Secondary | ICD-10-CM | POA: Diagnosis not present

## 2017-03-23 DIAGNOSIS — H401130 Primary open-angle glaucoma, bilateral, stage unspecified: Secondary | ICD-10-CM | POA: Diagnosis not present

## 2017-03-28 DIAGNOSIS — H401130 Primary open-angle glaucoma, bilateral, stage unspecified: Secondary | ICD-10-CM | POA: Diagnosis not present

## 2017-05-02 DIAGNOSIS — H401130 Primary open-angle glaucoma, bilateral, stage unspecified: Secondary | ICD-10-CM | POA: Diagnosis not present

## 2017-05-03 DIAGNOSIS — E1140 Type 2 diabetes mellitus with diabetic neuropathy, unspecified: Secondary | ICD-10-CM | POA: Diagnosis not present

## 2017-05-03 DIAGNOSIS — R2681 Unsteadiness on feet: Secondary | ICD-10-CM | POA: Diagnosis not present

## 2017-05-03 DIAGNOSIS — I10 Essential (primary) hypertension: Secondary | ICD-10-CM | POA: Diagnosis not present

## 2017-05-03 DIAGNOSIS — F0150 Vascular dementia without behavioral disturbance: Secondary | ICD-10-CM | POA: Diagnosis not present

## 2017-05-03 DIAGNOSIS — D638 Anemia in other chronic diseases classified elsewhere: Secondary | ICD-10-CM | POA: Diagnosis not present

## 2017-05-10 DIAGNOSIS — R29898 Other symptoms and signs involving the musculoskeletal system: Secondary | ICD-10-CM | POA: Diagnosis not present

## 2017-05-10 DIAGNOSIS — I10 Essential (primary) hypertension: Secondary | ICD-10-CM | POA: Diagnosis not present

## 2017-05-10 DIAGNOSIS — G2581 Restless legs syndrome: Secondary | ICD-10-CM | POA: Diagnosis not present

## 2017-05-10 DIAGNOSIS — F0150 Vascular dementia without behavioral disturbance: Secondary | ICD-10-CM | POA: Diagnosis not present

## 2017-05-10 DIAGNOSIS — D649 Anemia, unspecified: Secondary | ICD-10-CM | POA: Diagnosis not present

## 2017-05-10 DIAGNOSIS — R2681 Unsteadiness on feet: Secondary | ICD-10-CM | POA: Diagnosis not present

## 2017-05-10 DIAGNOSIS — E119 Type 2 diabetes mellitus without complications: Secondary | ICD-10-CM | POA: Diagnosis not present

## 2017-06-15 DIAGNOSIS — H903 Sensorineural hearing loss, bilateral: Secondary | ICD-10-CM | POA: Diagnosis not present

## 2017-06-15 DIAGNOSIS — H6123 Impacted cerumen, bilateral: Secondary | ICD-10-CM | POA: Diagnosis not present

## 2017-09-02 DIAGNOSIS — F0150 Vascular dementia without behavioral disturbance: Secondary | ICD-10-CM | POA: Diagnosis not present

## 2017-09-02 DIAGNOSIS — G2581 Restless legs syndrome: Secondary | ICD-10-CM | POA: Diagnosis not present

## 2017-09-02 DIAGNOSIS — I10 Essential (primary) hypertension: Secondary | ICD-10-CM | POA: Diagnosis not present

## 2017-09-02 DIAGNOSIS — D649 Anemia, unspecified: Secondary | ICD-10-CM | POA: Diagnosis not present

## 2017-09-02 DIAGNOSIS — E119 Type 2 diabetes mellitus without complications: Secondary | ICD-10-CM | POA: Diagnosis not present

## 2017-09-02 DIAGNOSIS — R2681 Unsteadiness on feet: Secondary | ICD-10-CM | POA: Diagnosis not present

## 2017-09-02 DIAGNOSIS — R29898 Other symptoms and signs involving the musculoskeletal system: Secondary | ICD-10-CM | POA: Diagnosis not present

## 2017-09-07 DIAGNOSIS — G2581 Restless legs syndrome: Secondary | ICD-10-CM | POA: Diagnosis not present

## 2017-09-07 DIAGNOSIS — R2681 Unsteadiness on feet: Secondary | ICD-10-CM | POA: Diagnosis not present

## 2017-09-07 DIAGNOSIS — E119 Type 2 diabetes mellitus without complications: Secondary | ICD-10-CM | POA: Diagnosis not present

## 2017-09-07 DIAGNOSIS — F0150 Vascular dementia without behavioral disturbance: Secondary | ICD-10-CM | POA: Diagnosis not present

## 2017-09-07 DIAGNOSIS — R29898 Other symptoms and signs involving the musculoskeletal system: Secondary | ICD-10-CM | POA: Diagnosis not present

## 2017-09-07 DIAGNOSIS — I10 Essential (primary) hypertension: Secondary | ICD-10-CM | POA: Diagnosis not present

## 2017-09-07 DIAGNOSIS — D649 Anemia, unspecified: Secondary | ICD-10-CM | POA: Diagnosis not present

## 2017-09-14 DIAGNOSIS — N3000 Acute cystitis without hematuria: Secondary | ICD-10-CM | POA: Diagnosis not present

## 2017-09-14 DIAGNOSIS — R410 Disorientation, unspecified: Secondary | ICD-10-CM | POA: Diagnosis not present

## 2017-09-14 DIAGNOSIS — Z0000 Encounter for general adult medical examination without abnormal findings: Secondary | ICD-10-CM | POA: Diagnosis not present

## 2017-09-14 DIAGNOSIS — E119 Type 2 diabetes mellitus without complications: Secondary | ICD-10-CM | POA: Diagnosis not present

## 2017-09-14 DIAGNOSIS — I10 Essential (primary) hypertension: Secondary | ICD-10-CM | POA: Diagnosis not present

## 2017-09-30 DIAGNOSIS — N3000 Acute cystitis without hematuria: Secondary | ICD-10-CM | POA: Diagnosis not present

## 2017-10-03 DIAGNOSIS — Z1211 Encounter for screening for malignant neoplasm of colon: Secondary | ICD-10-CM | POA: Diagnosis not present

## 2017-10-06 DIAGNOSIS — H401130 Primary open-angle glaucoma, bilateral, stage unspecified: Secondary | ICD-10-CM | POA: Diagnosis not present

## 2017-10-06 DIAGNOSIS — H401133 Primary open-angle glaucoma, bilateral, severe stage: Secondary | ICD-10-CM | POA: Diagnosis not present

## 2017-12-09 DIAGNOSIS — H401130 Primary open-angle glaucoma, bilateral, stage unspecified: Secondary | ICD-10-CM | POA: Diagnosis not present

## 2018-01-10 DIAGNOSIS — H903 Sensorineural hearing loss, bilateral: Secondary | ICD-10-CM | POA: Diagnosis not present

## 2018-02-10 ENCOUNTER — Other Ambulatory Visit: Payer: Self-pay | Admitting: Internal Medicine

## 2018-02-10 ENCOUNTER — Ambulatory Visit
Admission: RE | Admit: 2018-02-10 | Discharge: 2018-02-10 | Disposition: A | Discharge status: Home / Self Care | Payer: Medicare HMO | Source: Ambulatory Visit | Attending: Internal Medicine | Admitting: Internal Medicine

## 2018-02-10 DIAGNOSIS — R1031 Right lower quadrant pain: Secondary | ICD-10-CM | POA: Diagnosis not present

## 2018-02-10 DIAGNOSIS — K219 Gastro-esophageal reflux disease without esophagitis: Secondary | ICD-10-CM | POA: Diagnosis not present

## 2018-02-10 DIAGNOSIS — M79605 Pain in left leg: Secondary | ICD-10-CM | POA: Diagnosis not present

## 2018-02-10 DIAGNOSIS — R112 Nausea with vomiting, unspecified: Secondary | ICD-10-CM | POA: Diagnosis not present

## 2018-02-10 DIAGNOSIS — I10 Essential (primary) hypertension: Secondary | ICD-10-CM | POA: Diagnosis not present

## 2018-02-10 DIAGNOSIS — I82412 Acute embolism and thrombosis of left femoral vein: Secondary | ICD-10-CM | POA: Diagnosis not present

## 2018-02-10 DIAGNOSIS — I82891 Chronic embolism and thrombosis of other specified veins: Secondary | ICD-10-CM | POA: Diagnosis not present

## 2018-02-10 DIAGNOSIS — F0150 Vascular dementia without behavioral disturbance: Secondary | ICD-10-CM | POA: Diagnosis not present

## 2018-02-10 DIAGNOSIS — D638 Anemia in other chronic diseases classified elsewhere: Secondary | ICD-10-CM | POA: Diagnosis not present

## 2018-02-10 DIAGNOSIS — E1140 Type 2 diabetes mellitus with diabetic neuropathy, unspecified: Secondary | ICD-10-CM | POA: Diagnosis not present

## 2018-02-13 ENCOUNTER — Other Ambulatory Visit: Payer: Self-pay | Admitting: Internal Medicine

## 2018-02-16 ENCOUNTER — Other Ambulatory Visit: Payer: Self-pay | Admitting: Internal Medicine

## 2018-02-16 DIAGNOSIS — M79605 Pain in left leg: Secondary | ICD-10-CM

## 2018-03-03 DIAGNOSIS — D638 Anemia in other chronic diseases classified elsewhere: Secondary | ICD-10-CM | POA: Diagnosis not present

## 2018-03-03 DIAGNOSIS — R29898 Other symptoms and signs involving the musculoskeletal system: Secondary | ICD-10-CM | POA: Diagnosis not present

## 2018-03-03 DIAGNOSIS — I10 Essential (primary) hypertension: Secondary | ICD-10-CM | POA: Diagnosis not present

## 2018-03-03 DIAGNOSIS — E1140 Type 2 diabetes mellitus with diabetic neuropathy, unspecified: Secondary | ICD-10-CM | POA: Diagnosis not present

## 2018-03-03 DIAGNOSIS — F0150 Vascular dementia without behavioral disturbance: Secondary | ICD-10-CM | POA: Diagnosis not present

## 2018-03-03 DIAGNOSIS — I824Y2 Acute embolism and thrombosis of unspecified deep veins of left proximal lower extremity: Secondary | ICD-10-CM | POA: Diagnosis not present

## 2018-04-11 DIAGNOSIS — H401133 Primary open-angle glaucoma, bilateral, severe stage: Secondary | ICD-10-CM | POA: Diagnosis not present

## 2018-04-11 DIAGNOSIS — H401130 Primary open-angle glaucoma, bilateral, stage unspecified: Secondary | ICD-10-CM | POA: Diagnosis not present

## 2018-04-18 ENCOUNTER — Other Ambulatory Visit: Payer: Self-pay

## 2018-04-18 ENCOUNTER — Inpatient Hospital Stay: Payer: Medicare HMO

## 2018-04-18 ENCOUNTER — Inpatient Hospital Stay
Admission: EM | Admit: 2018-04-18 | Discharge: 2018-05-06 | DRG: 870 | Disposition: A | Discharge status: Skilled Nursing Facility | Payer: Medicare HMO | Attending: Internal Medicine | Admitting: Internal Medicine

## 2018-04-18 ENCOUNTER — Emergency Department: Payer: Medicare HMO

## 2018-04-18 ENCOUNTER — Encounter: Payer: Self-pay | Admitting: Radiology

## 2018-04-18 DIAGNOSIS — T508X5A Adverse effect of diagnostic agents, initial encounter: Secondary | ICD-10-CM | POA: Diagnosis not present

## 2018-04-18 DIAGNOSIS — D696 Thrombocytopenia, unspecified: Secondary | ICD-10-CM | POA: Diagnosis present

## 2018-04-18 DIAGNOSIS — R6521 Severe sepsis with septic shock: Secondary | ICD-10-CM | POA: Diagnosis not present

## 2018-04-18 DIAGNOSIS — Z9889 Other specified postprocedural states: Secondary | ICD-10-CM | POA: Diagnosis not present

## 2018-04-18 DIAGNOSIS — I110 Hypertensive heart disease with heart failure: Secondary | ICD-10-CM | POA: Diagnosis present

## 2018-04-18 DIAGNOSIS — G931 Anoxic brain damage, not elsewhere classified: Secondary | ICD-10-CM | POA: Diagnosis not present

## 2018-04-18 DIAGNOSIS — D638 Anemia in other chronic diseases classified elsewhere: Secondary | ICD-10-CM | POA: Diagnosis present

## 2018-04-18 DIAGNOSIS — Z66 Do not resuscitate: Secondary | ICD-10-CM | POA: Diagnosis not present

## 2018-04-18 DIAGNOSIS — F0150 Vascular dementia without behavioral disturbance: Secondary | ICD-10-CM | POA: Diagnosis present

## 2018-04-18 DIAGNOSIS — Z794 Long term (current) use of insulin: Secondary | ICD-10-CM | POA: Diagnosis not present

## 2018-04-18 DIAGNOSIS — G40901 Epilepsy, unspecified, not intractable, with status epilepticus: Secondary | ICD-10-CM

## 2018-04-18 DIAGNOSIS — Z7401 Bed confinement status: Secondary | ICD-10-CM | POA: Diagnosis not present

## 2018-04-18 DIAGNOSIS — N141 Nephropathy induced by other drugs, medicaments and biological substances: Secondary | ICD-10-CM | POA: Diagnosis present

## 2018-04-18 DIAGNOSIS — Z452 Encounter for adjustment and management of vascular access device: Secondary | ICD-10-CM | POA: Diagnosis not present

## 2018-04-18 DIAGNOSIS — R41841 Cognitive communication deficit: Secondary | ICD-10-CM | POA: Diagnosis not present

## 2018-04-18 DIAGNOSIS — G039 Meningitis, unspecified: Secondary | ICD-10-CM | POA: Diagnosis not present

## 2018-04-18 DIAGNOSIS — N179 Acute kidney failure, unspecified: Secondary | ICD-10-CM

## 2018-04-18 DIAGNOSIS — Z86718 Personal history of other venous thrombosis and embolism: Secondary | ICD-10-CM

## 2018-04-18 DIAGNOSIS — D62 Acute posthemorrhagic anemia: Secondary | ICD-10-CM | POA: Diagnosis not present

## 2018-04-18 DIAGNOSIS — R630 Anorexia: Secondary | ICD-10-CM

## 2018-04-18 DIAGNOSIS — R945 Abnormal results of liver function studies: Secondary | ICD-10-CM | POA: Diagnosis present

## 2018-04-18 DIAGNOSIS — Z6829 Body mass index (BMI) 29.0-29.9, adult: Secondary | ICD-10-CM

## 2018-04-18 DIAGNOSIS — E1165 Type 2 diabetes mellitus with hyperglycemia: Secondary | ICD-10-CM | POA: Diagnosis present

## 2018-04-18 DIAGNOSIS — J9690 Respiratory failure, unspecified, unspecified whether with hypoxia or hypercapnia: Secondary | ICD-10-CM

## 2018-04-18 DIAGNOSIS — R4182 Altered mental status, unspecified: Secondary | ICD-10-CM | POA: Diagnosis not present

## 2018-04-18 DIAGNOSIS — J9811 Atelectasis: Secondary | ICD-10-CM | POA: Diagnosis not present

## 2018-04-18 DIAGNOSIS — A419 Sepsis, unspecified organism: Secondary | ICD-10-CM | POA: Diagnosis not present

## 2018-04-18 DIAGNOSIS — R9401 Abnormal electroencephalogram [EEG]: Secondary | ICD-10-CM | POA: Diagnosis present

## 2018-04-18 DIAGNOSIS — Z7901 Long term (current) use of anticoagulants: Secondary | ICD-10-CM

## 2018-04-18 DIAGNOSIS — J9600 Acute respiratory failure, unspecified whether with hypoxia or hypercapnia: Secondary | ICD-10-CM

## 2018-04-18 DIAGNOSIS — Z515 Encounter for palliative care: Secondary | ICD-10-CM

## 2018-04-18 DIAGNOSIS — E569 Vitamin deficiency, unspecified: Secondary | ICD-10-CM | POA: Diagnosis not present

## 2018-04-18 DIAGNOSIS — E876 Hypokalemia: Secondary | ICD-10-CM | POA: Diagnosis present

## 2018-04-18 DIAGNOSIS — E119 Type 2 diabetes mellitus without complications: Secondary | ICD-10-CM | POA: Diagnosis not present

## 2018-04-18 DIAGNOSIS — E1129 Type 2 diabetes mellitus with other diabetic kidney complication: Secondary | ICD-10-CM | POA: Diagnosis not present

## 2018-04-18 DIAGNOSIS — J9601 Acute respiratory failure with hypoxia: Secondary | ICD-10-CM | POA: Diagnosis not present

## 2018-04-18 DIAGNOSIS — M6281 Muscle weakness (generalized): Secondary | ICD-10-CM | POA: Diagnosis not present

## 2018-04-18 DIAGNOSIS — N170 Acute kidney failure with tubular necrosis: Secondary | ICD-10-CM | POA: Diagnosis not present

## 2018-04-18 DIAGNOSIS — G060 Intracranial abscess and granuloma: Secondary | ICD-10-CM | POA: Diagnosis not present

## 2018-04-18 DIAGNOSIS — G9341 Metabolic encephalopathy: Secondary | ICD-10-CM | POA: Diagnosis present

## 2018-04-18 DIAGNOSIS — Z7189 Other specified counseling: Secondary | ICD-10-CM | POA: Diagnosis not present

## 2018-04-18 DIAGNOSIS — R509 Fever, unspecified: Secondary | ICD-10-CM | POA: Diagnosis not present

## 2018-04-18 DIAGNOSIS — T368X5A Adverse effect of other systemic antibiotics, initial encounter: Secondary | ICD-10-CM | POA: Diagnosis present

## 2018-04-18 DIAGNOSIS — M79604 Pain in right leg: Secondary | ICD-10-CM | POA: Diagnosis not present

## 2018-04-18 DIAGNOSIS — F17220 Nicotine dependence, chewing tobacco, uncomplicated: Secondary | ICD-10-CM | POA: Diagnosis not present

## 2018-04-18 DIAGNOSIS — Z79899 Other long term (current) drug therapy: Secondary | ICD-10-CM | POA: Diagnosis not present

## 2018-04-18 DIAGNOSIS — R410 Disorientation, unspecified: Secondary | ICD-10-CM | POA: Diagnosis not present

## 2018-04-18 DIAGNOSIS — R4020 Unspecified coma: Secondary | ICD-10-CM | POA: Diagnosis not present

## 2018-04-18 DIAGNOSIS — M79605 Pain in left leg: Secondary | ICD-10-CM | POA: Diagnosis not present

## 2018-04-18 DIAGNOSIS — G2581 Restless legs syndrome: Secondary | ICD-10-CM | POA: Diagnosis present

## 2018-04-18 DIAGNOSIS — I5032 Chronic diastolic (congestive) heart failure: Secondary | ICD-10-CM | POA: Diagnosis present

## 2018-04-18 DIAGNOSIS — I5030 Unspecified diastolic (congestive) heart failure: Secondary | ICD-10-CM | POA: Diagnosis not present

## 2018-04-18 DIAGNOSIS — G009 Bacterial meningitis, unspecified: Secondary | ICD-10-CM | POA: Diagnosis not present

## 2018-04-18 DIAGNOSIS — Z8249 Family history of ischemic heart disease and other diseases of the circulatory system: Secondary | ICD-10-CM

## 2018-04-18 DIAGNOSIS — Z8673 Personal history of transient ischemic attack (TIA), and cerebral infarction without residual deficits: Secondary | ICD-10-CM | POA: Diagnosis not present

## 2018-04-18 DIAGNOSIS — E872 Acidosis: Secondary | ICD-10-CM | POA: Diagnosis present

## 2018-04-18 DIAGNOSIS — Z960 Presence of urogenital implants: Secondary | ICD-10-CM | POA: Diagnosis not present

## 2018-04-18 DIAGNOSIS — R195 Other fecal abnormalities: Secondary | ICD-10-CM | POA: Diagnosis not present

## 2018-04-18 DIAGNOSIS — I10 Essential (primary) hypertension: Secondary | ICD-10-CM | POA: Diagnosis not present

## 2018-04-18 DIAGNOSIS — G9340 Encephalopathy, unspecified: Secondary | ICD-10-CM | POA: Diagnosis not present

## 2018-04-18 DIAGNOSIS — R1312 Dysphagia, oropharyngeal phase: Secondary | ICD-10-CM | POA: Diagnosis not present

## 2018-04-18 DIAGNOSIS — G92 Toxic encephalopathy: Secondary | ICD-10-CM | POA: Diagnosis not present

## 2018-04-18 DIAGNOSIS — R52 Pain, unspecified: Secondary | ICD-10-CM | POA: Diagnosis not present

## 2018-04-18 DIAGNOSIS — R6510 Systemic inflammatory response syndrome (SIRS) of non-infectious origin without acute organ dysfunction: Secondary | ICD-10-CM | POA: Diagnosis not present

## 2018-04-18 DIAGNOSIS — I6523 Occlusion and stenosis of bilateral carotid arteries: Secondary | ICD-10-CM | POA: Diagnosis not present

## 2018-04-18 DIAGNOSIS — F17290 Nicotine dependence, other tobacco product, uncomplicated: Secondary | ICD-10-CM | POA: Diagnosis present

## 2018-04-18 DIAGNOSIS — D631 Anemia in chronic kidney disease: Secondary | ICD-10-CM | POA: Diagnosis not present

## 2018-04-18 LAB — COMPREHENSIVE METABOLIC PANEL
ALT: 22 U/L (ref 0–44)
AST: 37 U/L (ref 15–41)
Albumin: 4 g/dL (ref 3.5–5.0)
Alkaline Phosphatase: 46 U/L (ref 38–126)
Anion gap: 16 — ABNORMAL HIGH (ref 5–15)
BILIRUBIN TOTAL: 1.1 mg/dL (ref 0.3–1.2)
BUN: 15 mg/dL (ref 8–23)
CO2: 25 mmol/L (ref 22–32)
Calcium: 9.7 mg/dL (ref 8.9–10.3)
Chloride: 98 mmol/L (ref 98–111)
Creatinine, Ser: 1.05 mg/dL — ABNORMAL HIGH (ref 0.44–1.00)
GFR calc Af Amer: 57 mL/min — ABNORMAL LOW (ref >60)
GFR calc non Af Amer: 49 mL/min — ABNORMAL LOW (ref >60)
GLUCOSE: 253 mg/dL — AB (ref 70–99)
POTASSIUM: 3.3 mmol/L — AB (ref 3.5–5.1)
Sodium: 139 mmol/L (ref 135–145)
TOTAL PROTEIN: 7.3 g/dL (ref 6.5–8.1)

## 2018-04-18 LAB — BASIC METABOLIC PANEL
ANION GAP: 10 (ref 5–15)
BUN: 17 mg/dL (ref 8–23)
CO2: 25 mmol/L (ref 22–32)
Calcium: 8.3 mg/dL — ABNORMAL LOW (ref 8.9–10.3)
Chloride: 104 mmol/L (ref 98–111)
Creatinine, Ser: 1.3 mg/dL — ABNORMAL HIGH (ref 0.44–1.00)
GFR calc Af Amer: 44 mL/min — ABNORMAL LOW (ref >60)
GFR calc non Af Amer: 38 mL/min — ABNORMAL LOW (ref >60)
GLUCOSE: 153 mg/dL — AB (ref 70–99)
POTASSIUM: 3.3 mmol/L — AB (ref 3.5–5.1)
Sodium: 139 mmol/L (ref 135–145)

## 2018-04-18 LAB — BLOOD GAS, ARTERIAL
Acid-Base Excess: 1.6 mmol/L (ref 0.0–2.0)
Acid-Base Excess: 0.2 mmol/L (ref 0.0–2.0)
BICARBONATE: 22.4 mmol/L (ref 20.0–28.0)
BICARBONATE: 23.3 mmol/L (ref 20.0–28.0)
DRAWN BY: 28459
FIO2: 0.4
FIO2: 28
MECHANICAL RATE: 10
MECHVT: 500 mL
O2 SAT: 99.1 %
O2 Saturation: 98.2 %
PATIENT TEMPERATURE: 39
PEEP/CPAP: 5 cmH2O
PO2 ART: 120 mmHg — AB (ref 83.0–108.0)
Patient temperature: 37
pCO2 arterial: 25 mmHg — ABNORMAL LOW (ref 32.0–48.0)
pCO2 arterial: 32 mmHg (ref 32.0–48.0)
pH, Arterial: 7.56 — ABNORMAL HIGH (ref 7.350–7.450)
pH, Arterial: 7.44 (ref 7.350–7.450)
pO2, Arterial: 114 mmHg — ABNORMAL HIGH (ref 83.0–108.0)

## 2018-04-18 LAB — URINALYSIS, ROUTINE W REFLEX MICROSCOPIC
Bacteria, UA: NONE SEEN
Bilirubin Urine: NEGATIVE
Glucose, UA: >=500 mg/dL — AB
Ketones, ur: 20 mg/dL — AB
Leukocytes, UA: NEGATIVE
Nitrite: NEGATIVE
PROTEIN: NEGATIVE mg/dL
SQUAMOUS EPITHELIAL / LPF: NONE SEEN (ref 0–5)
Specific Gravity, Urine: 1.006 (ref 1.005–1.030)
pH: 8 (ref 5.0–8.0)

## 2018-04-18 LAB — PROTIME-INR
INR: 1.05
PROTHROMBIN TIME: 13.6 s (ref 11.4–15.2)

## 2018-04-18 LAB — GLUCOSE, CAPILLARY
GLUCOSE-CAPILLARY: 120 mg/dL — AB (ref 70–99)
Glucose-Capillary: 235 mg/dL — ABNORMAL HIGH (ref 70–99)
Glucose-Capillary: 221 mg/dL — ABNORMAL HIGH (ref 70–99)
Glucose-Capillary: 220 mg/dL — ABNORMAL HIGH (ref 70–99)
Glucose-Capillary: 175 mg/dL — ABNORMAL HIGH (ref 70–99)

## 2018-04-18 LAB — TROPONIN I
TROPONIN I: 0.06 ng/mL — AB (ref <0.03)
Troponin I: 0.03 ng/mL (ref <0.03)
Troponin I: 0.06 ng/mL (ref <0.03)

## 2018-04-18 LAB — CBC WITH DIFFERENTIAL/PLATELET
Basophils Absolute: 0 10*3/uL (ref 0–0.1)
Basophils Relative: 0 %
EOS ABS: 0 10*3/uL (ref 0–0.7)
Eosinophils Relative: 0 %
HEMATOCRIT: 42.2 % (ref 35.0–47.0)
HEMOGLOBIN: 13.5 g/dL (ref 12.0–16.0)
LYMPHS ABS: 0.8 10*3/uL — AB (ref 1.0–3.6)
Lymphocytes Relative: 5 %
MCH: 24.8 pg — AB (ref 26.0–34.0)
MCHC: 31.9 g/dL — AB (ref 32.0–36.0)
MCV: 77.8 fL — AB (ref 80.0–100.0)
MONOS PCT: 2 %
Monocytes Absolute: 0.3 10*3/uL (ref 0.2–0.9)
NEUTROS ABS: 15.1 10*3/uL — AB (ref 1.4–6.5)
NEUTROS PCT: 93 %
Platelets: 200 10*3/uL (ref 150–440)
RBC: 5.43 MIL/uL — ABNORMAL HIGH (ref 3.80–5.20)
RDW: 14.5 % (ref 11.5–14.5)
WBC: 16.3 10*3/uL — ABNORMAL HIGH (ref 3.6–11.0)

## 2018-04-18 LAB — LACTIC ACID, PLASMA
Lactic Acid, Venous: 3.4 mmol/L (ref 0.5–1.9)
Lactic Acid, Venous: 3.2 mmol/L (ref 0.5–1.9)
Lactic Acid, Venous: 3.4 mmol/L (ref 0.5–1.9)

## 2018-04-18 LAB — AMMONIA: AMMONIA: 12 umol/L (ref 9–35)

## 2018-04-18 LAB — ACETAMINOPHEN LEVEL

## 2018-04-18 LAB — MAGNESIUM
MAGNESIUM: 2.6 mg/dL — AB (ref 1.7–2.4)
Magnesium: 0.9 mg/dL — CL (ref 1.7–2.4)

## 2018-04-18 LAB — HEMOGLOBIN A1C
HEMOGLOBIN A1C: 7.3 % — AB (ref 4.8–5.6)
MEAN PLASMA GLUCOSE: 162.81 mg/dL

## 2018-04-18 LAB — MRSA PCR SCREENING: MRSA by PCR: NEGATIVE

## 2018-04-18 LAB — SALICYLATE LEVEL: Salicylate Lvl: <7 mg/dL (ref 2.8–30.0)

## 2018-04-18 LAB — PHOSPHORUS: Phosphorus: 2.7 mg/dL (ref 2.5–4.6)

## 2018-04-18 LAB — PROCALCITONIN: Procalcitonin: <0.1 ng/mL

## 2018-04-18 LAB — LIPASE, BLOOD: Lipase: 17 U/L (ref 11–51)

## 2018-04-18 LAB — TSH: TSH: 1.456 u[IU]/mL (ref 0.350–4.500)

## 2018-04-18 LAB — CK: CK TOTAL: 161 U/L (ref 38–234)

## 2018-04-18 LAB — T4, FREE: FREE T4: 1.02 ng/dL (ref 0.82–1.77)

## 2018-04-18 LAB — APTT: aPTT: 27 seconds (ref 24–36)

## 2018-04-18 MED ORDER — ALBUTEROL SULFATE (2.5 MG/3ML) 0.083% IN NEBU
2.5000 mg | INHALATION_SOLUTION | RESPIRATORY_TRACT | Status: DC
  Administered 2018-04-18 – 2018-04-21 (×16): 2.5 mg via RESPIRATORY_TRACT
  Filled 2018-04-18 (×16): qty 3

## 2018-04-18 MED ORDER — HYDRALAZINE HCL 20 MG/ML IJ SOLN
10.0000 mg | INTRAMUSCULAR | Status: DC | PRN
  Administered 2018-04-30: 10 mg via INTRAVENOUS
  Filled 2018-04-18 (×2): qty 1

## 2018-04-18 MED ORDER — ROCURONIUM BROMIDE 50 MG/5ML IV SOLN: 80.0000 mg | Freq: Once | INTRAVENOUS | Status: DC

## 2018-04-18 MED ORDER — SODIUM CHLORIDE 0.9 % IV BOLUS (SEPSIS)
1000.0000 mL | Freq: Once | INTRAVENOUS | Status: AC
  Administered 2018-04-18: 1000 mL via INTRAVENOUS

## 2018-04-18 MED ORDER — MIDAZOLAM HCL 2 MG/2ML IJ SOLN
2.0000 mg | INTRAMUSCULAR | Status: DC | PRN
  Filled 2018-04-18 (×3): qty 2

## 2018-04-18 MED ORDER — IBUPROFEN 100 MG/5ML PO SUSP
600.0000 mg | Freq: Four times a day (QID) | ORAL | Status: DC | PRN
  Administered 2018-04-18 – 2018-04-19 (×2): 600 mg
  Filled 2018-04-18 (×8): qty 30

## 2018-04-18 MED ORDER — PROPOFOL 1000 MG/100ML IV EMUL
INTRAVENOUS | Status: AC
  Administered 2018-04-18: 12:00:00 via INTRAVENOUS
  Filled 2018-04-18: qty 100

## 2018-04-18 MED ORDER — MAGNESIUM SULFATE 2 GM/50ML IV SOLN
2.0000 g | Freq: Once | INTRAVENOUS | Status: DC
  Filled 2018-04-18: qty 50

## 2018-04-18 MED ORDER — MAGNESIUM SULFATE 4 GM/100ML IV SOLN
4.0000 g | Freq: Once | INTRAVENOUS | Status: AC
  Administered 2018-04-18: 4 g via INTRAVENOUS
  Filled 2018-04-18: qty 100

## 2018-04-18 MED ORDER — FENTANYL 2500MCG IN NS 250ML (10MCG/ML) PREMIX INFUSION
0.0000 ug/h | INTRAVENOUS | Status: DC
  Administered 2018-04-18: 100 ug/h via INTRAVENOUS
  Administered 2018-04-19: 75 ug/h via INTRAVENOUS
  Administered 2018-04-21: 100 ug/h via INTRAVENOUS
  Administered 2018-04-22: 150 ug/h via INTRAVENOUS
  Filled 2018-04-18 (×4): qty 250

## 2018-04-18 MED ORDER — INSULIN ASPART 100 UNIT/ML ~~LOC~~ SOLN: 0.0000 [IU] | Freq: Every day | SUBCUTANEOUS | Status: DC

## 2018-04-18 MED ORDER — SODIUM CHLORIDE 0.9 % IV SOLN
2.0000 g | INTRAVENOUS | Status: DC
  Filled 2018-04-18 (×5): qty 2000

## 2018-04-18 MED ORDER — DEXTROSE 5 % IV SOLN
680.0000 mg | Freq: Once | INTRAVENOUS | Status: AC
  Administered 2018-04-18: 680 mg via INTRAVENOUS
  Filled 2018-04-18: qty 13.6

## 2018-04-18 MED ORDER — LIDOCAINE HCL (PF) 1 % IJ SOLN
5.0000 mL | Freq: Once | INTRAMUSCULAR | Status: DC
  Filled 2018-04-18: qty 5

## 2018-04-18 MED ORDER — LORAZEPAM 2 MG/ML IJ SOLN
INTRAMUSCULAR | Status: AC
  Administered 2018-04-18: 2 mg
  Filled 2018-04-18: qty 1

## 2018-04-18 MED ORDER — MIDAZOLAM HCL 2 MG/2ML IJ SOLN
2.0000 mg | INTRAMUSCULAR | Status: DC | PRN
  Administered 2018-04-21 – 2018-04-22 (×5): 2 mg via INTRAVENOUS
  Filled 2018-04-18 (×4): qty 2

## 2018-04-18 MED ORDER — INSULIN ASPART 100 UNIT/ML ~~LOC~~ SOLN: 0.0000 [IU] | Freq: Three times a day (TID) | SUBCUTANEOUS | Status: DC

## 2018-04-18 MED ORDER — AMPICILLIN SODIUM 2 G IJ SOLR
2.0000 g | Freq: Once | INTRAMUSCULAR | Status: DC
  Filled 2018-04-18: qty 2000

## 2018-04-18 MED ORDER — LEVETIRACETAM IN NACL 1000 MG/100ML IV SOLN
1000.0000 mg | Freq: Once | INTRAVENOUS | Status: AC
  Administered 2018-04-18: 1000 mg via INTRAVENOUS
  Filled 2018-04-18: qty 100

## 2018-04-18 MED ORDER — ACETAMINOPHEN 325 MG PO TABS: 650.0000 mg | ORAL_TABLET | ORAL | Status: DC | PRN

## 2018-04-18 MED ORDER — CHLORHEXIDINE GLUCONATE 0.12% ORAL RINSE (MEDLINE KIT)
15.0000 mL | Freq: Two times a day (BID) | OROMUCOSAL | Status: DC
  Administered 2018-04-18 – 2018-04-22 (×9): 15 mL via OROMUCOSAL

## 2018-04-18 MED ORDER — FAMOTIDINE IN NACL 20-0.9 MG/50ML-% IV SOLN
20.0000 mg | Freq: Two times a day (BID) | INTRAVENOUS | Status: DC
  Administered 2018-04-18 – 2018-04-19 (×3): 20 mg via INTRAVENOUS
  Filled 2018-04-18 (×3): qty 50

## 2018-04-18 MED ORDER — VANCOMYCIN HCL IN DEXTROSE 1-5 GM/200ML-% IV SOLN
1000.0000 mg | INTRAVENOUS | Status: DC
Start: 2018-04-18 — End: 2018-04-20
  Administered 2018-04-18 – 2018-04-20 (×3): 1000 mg via INTRAVENOUS
  Filled 2018-04-18 (×3): qty 200

## 2018-04-18 MED ORDER — SODIUM CHLORIDE 0.9 % IV SOLN
2.0000 g | Freq: Once | INTRAVENOUS | Status: AC
  Administered 2018-04-18: 2 g via INTRAVENOUS
  Filled 2018-04-18: qty 2000

## 2018-04-18 MED ORDER — DEXAMETHASONE SODIUM PHOSPHATE 10 MG/ML IJ SOLN
10.0000 mg | Freq: Four times a day (QID) | INTRAMUSCULAR | Status: DC
  Administered 2018-04-18 – 2018-04-21 (×11): 10 mg via INTRAVENOUS
  Filled 2018-04-18 (×12): qty 1

## 2018-04-18 MED ORDER — VANCOMYCIN HCL IN DEXTROSE 1-5 GM/200ML-% IV SOLN
1000.0000 mg | Freq: Once | INTRAVENOUS | Status: AC
  Administered 2018-04-18: 1000 mg via INTRAVENOUS
  Filled 2018-04-18: qty 200

## 2018-04-18 MED ORDER — ACETAMINOPHEN 650 MG RE SUPP
650.0000 mg | Freq: Four times a day (QID) | RECTAL | Status: DC | PRN
  Administered 2018-04-19 (×2): 650 mg via RECTAL
  Filled 2018-04-18 (×2): qty 1

## 2018-04-18 MED ORDER — ONDANSETRON HCL 4 MG/2ML IJ SOLN: 4.0000 mg | Freq: Four times a day (QID) | INTRAMUSCULAR | Status: DC | PRN

## 2018-04-18 MED ORDER — INSULIN REGULAR HUMAN 100 UNIT/ML IJ SOLN
INTRAMUSCULAR | Status: DC
  Administered 2018-04-18: 2.3 [IU]/h via INTRAVENOUS
  Administered 2018-04-19: 7.7 [IU]/h via INTRAVENOUS
  Filled 2018-04-18 (×3): qty 1

## 2018-04-18 MED ORDER — SODIUM CHLORIDE 0.9 % IV SOLN
2.0000 g | Freq: Four times a day (QID) | INTRAVENOUS | Status: DC
  Administered 2018-04-18 – 2018-04-28 (×39): 2 g via INTRAVENOUS
  Filled 2018-04-18 (×2): qty 2
  Filled 2018-04-18: qty 2000
  Filled 2018-04-18 (×3): qty 2
  Filled 2018-04-18: qty 2000
  Filled 2018-04-18 (×7): qty 2
  Filled 2018-04-18 (×2): qty 2000
  Filled 2018-04-18 (×8): qty 2
  Filled 2018-04-18: qty 2000
  Filled 2018-04-18 (×10): qty 2
  Filled 2018-04-18: qty 2000
  Filled 2018-04-18 (×5): qty 2
  Filled 2018-04-18 (×2): qty 2000
  Filled 2018-04-18 (×4): qty 2

## 2018-04-18 MED ORDER — IOPAMIDOL (ISOVUE-370) INJECTION 76%
75.0000 mL | Freq: Once | INTRAVENOUS | Status: AC | PRN
  Administered 2018-04-18: 75 mL via INTRAVENOUS

## 2018-04-18 MED ORDER — PROPOFOL 10 MG/ML IV BOLUS: 150.0000 mg | Freq: Once | INTRAVENOUS | Status: DC

## 2018-04-18 MED ORDER — HYDRALAZINE HCL 20 MG/ML IJ SOLN
INTRAMUSCULAR | Status: AC
  Filled 2018-04-18: qty 1

## 2018-04-18 MED ORDER — ACETAMINOPHEN 10 MG/ML IV SOLN
1000.0000 mg | Freq: Once | INTRAVENOUS | Status: AC
  Administered 2018-04-18: 1000 mg via INTRAVENOUS
  Filled 2018-04-18: qty 100

## 2018-04-18 MED ORDER — SODIUM CHLORIDE 0.9 % IV SOLN
2.0000 g | Freq: Once | INTRAVENOUS | Status: AC
  Administered 2018-04-18: 2 g via INTRAVENOUS
  Filled 2018-04-18: qty 2

## 2018-04-18 MED ORDER — DEXTROSE 5 % IV SOLN
700.0000 mg | Freq: Two times a day (BID) | INTRAVENOUS | Status: DC
  Administered 2018-04-18 – 2018-04-22 (×8): 700 mg via INTRAVENOUS
  Filled 2018-04-18 (×9): qty 14

## 2018-04-18 MED ORDER — VANCOMYCIN HCL IN DEXTROSE 1-5 GM/200ML-% IV SOLN
1000.0000 mg | INTRAVENOUS | Status: DC
  Filled 2018-04-18 (×2): qty 200

## 2018-04-18 MED ORDER — SODIUM CHLORIDE 0.9 % IV SOLN
250.0000 mL | INTRAVENOUS | Status: DC | PRN
  Administered 2018-05-02: 500 mL via INTRAVENOUS
  Administered 2018-05-02: 250 mL via INTRAVENOUS
  Administered 2018-05-03: 13:00:00 via INTRAVENOUS
  Administered 2018-05-06: 250 mL via INTRAVENOUS

## 2018-04-18 MED ORDER — SODIUM CHLORIDE 0.9 % IV SOLN
2.0000 g | Freq: Two times a day (BID) | INTRAVENOUS | Status: DC
  Administered 2018-04-18 – 2018-05-01 (×26): 2 g via INTRAVENOUS
  Filled 2018-04-18 (×2): qty 2
  Filled 2018-04-18: qty 20
  Filled 2018-04-18 (×8): qty 2
  Filled 2018-04-18 (×2): qty 20
  Filled 2018-04-18 (×6): qty 2
  Filled 2018-04-18: qty 20
  Filled 2018-04-18 (×5): qty 2
  Filled 2018-04-18 (×2): qty 20
  Filled 2018-04-18 (×4): qty 2

## 2018-04-18 MED ORDER — SODIUM CHLORIDE 0.9 % IV BOLUS (SEPSIS)
2000.0000 mL | Freq: Once | INTRAVENOUS | Status: DC
  Administered 2018-04-18: 1000 mL via INTRAVENOUS

## 2018-04-18 MED ORDER — POTASSIUM CHLORIDE 2 MEQ/ML IV SOLN
INTRAVENOUS | Status: DC
  Administered 2018-04-18 – 2018-04-22 (×4): via INTRAVENOUS
  Filled 2018-04-18 (×9): qty 1000

## 2018-04-18 MED ORDER — ORAL CARE MOUTH RINSE
15.0000 mL | OROMUCOSAL | Status: DC
  Administered 2018-04-18 – 2018-04-22 (×40): 15 mL via OROMUCOSAL

## 2018-04-18 MED ORDER — SODIUM CHLORIDE 0.9 % IV SOLN
Freq: Once | INTRAVENOUS | Status: AC
  Administered 2018-04-18: 20:00:00 via INTRAVENOUS
  Filled 2018-04-18: qty 15

## 2018-04-18 MED ORDER — INSULIN ASPART 100 UNIT/ML ~~LOC~~ SOLN: 2.0000 [IU] | SUBCUTANEOUS | Status: DC

## 2018-04-18 MED ORDER — DEXAMETHASONE SODIUM PHOSPHATE 10 MG/ML IJ SOLN
10.0000 mg | Freq: Once | INTRAMUSCULAR | Status: AC
  Administered 2018-04-18: 10 mg via INTRAVENOUS
  Filled 2018-04-18: qty 1

## 2018-04-18 MED ORDER — SODIUM CHLORIDE 0.45 % IV SOLN: INTRAVENOUS | Status: DC

## 2018-04-18 MED ORDER — HEPARIN (PORCINE) IN NACL 100-0.45 UNIT/ML-% IJ SOLN
1000.0000 [IU]/h | INTRAMUSCULAR | Status: DC
  Administered 2018-04-18: 1000 [IU]/h via INTRAVENOUS

## 2018-04-18 NOTE — H&P (Signed)
[facility] [place] at [facility]   PATIENT NAME: Natalie Knight    MR#:  [id number]  DATE OF BIRTH:  [birth year]  DATE OF ADMISSION:  04/18/2018  PRIMARY CARE PHYSICIAN: Barbette Reichmann, MD   REQUESTING/REFERRING PHYSICIAN:   CHIEF COMPLAINT:   Chief Complaint  Patient presents with  . Altered Mental Status    HISTORY OF PRESENT ILLNESS: Natalie Knight  is a 82 y.o. female with a known history per below which also includes anemia chronic disease, dementia, gout, DVT on Eliquis presenting from her apartment, found unresponsive in the bed, patient nonverbal/not following commands/no eye-opening, family member started CPR, EMS brought to the emergency room, code stroke initiated, patient noted to be comatose, patient electively intubated, patient noted to have fever of 102.8, tachycardic, hypertensive, potassium 3.3, white count 16,000, CT angios was a negative study, CT head negative, chest x-ray negative, urinalysis noted for acute ketones/dehydration, glucose 253, patient evaluated in the emergency room, multiple daughters at the bedside, patient remains comatose, patient with positive meningismus/nuchal rigidity, decerebrate bilateral posturing noted with painful stimulation, pupils equally round reactive to light, patient is now been admitted for acute sepsis due to suspected acute bacterial meningitis, acute comatose state.  PAST MEDICAL HISTORY:   Past Medical History:  Diagnosis Date  . Diabetes mellitus without complication (HCC)   . Hypertension     PAST SURGICAL HISTORY:  None  SOCIAL HISTORY:  Social History   Tobacco Use  . Smoking status: Never Smoker  . Smokeless tobacco: Current User    Types: Snuff  Substance Use Topics  . Alcohol use: No    FAMILY HISTORY:  HTN  DRUG ALLERGIES: No Known Allergies  REVIEW OF SYSTEMS: Unable to be obtained given comatose state, family present  CONSTITUTIONAL: No fever, fatigue or weakness.  EYES: No blurred  or double vision.  EARS, NOSE, AND THROAT: No tinnitus or ear pain.  RESPIRATORY: No cough, shortness of breath, wheezing or hemoptysis.  CARDIOVASCULAR: No chest pain, orthopnea, edema.  GASTROINTESTINAL: No nausea, vomiting, diarrhea or abdominal pain.  GENITOURINARY: No dysuria, hematuria.  ENDOCRINE: No polyuria, nocturia,  HEMATOLOGY: No anemia, easy bruising or bleeding SKIN: No rash or lesion. MUSCULOSKELETAL: No joint pain or arthritis.   NEUROLOGIC: No tingling, numbness, weakness.  PSYCHIATRY: No anxiety or depression.   MEDICATIONS AT HOME:  Prior to Admission medications   Medication Sig Start Date End Date Taking? Authorizing Provider  bisacodyl (DULCOLAX) 5 MG EC tablet Take 1 tablet by mouth daily as needed.   Yes [provider]  cetirizine (ZYRTEC) 10 MG tablet Take 1 tablet by mouth daily.   Yes [provider]  docusate sodium (COLACE) 100 MG capsule Take 100 mg by mouth 2 (two) times daily as needed for mild constipation.   Yes [provider]  ELIQUIS 2.5 MG TABS tablet Take 1 tablet by mouth 2 (two) times daily. 03/31/18  Yes [provider]  escitalopram (LEXAPRO) 20 MG tablet Take 1 tablet by mouth daily. 03/20/18  Yes [provider]  gabapentin (NEURONTIN) 100 MG capsule Take 1 capsule by mouth 3 (three) times daily. 03/31/18  Yes [provider]  losartan (COZAAR) 100 MG tablet Take 1 tablet by mouth daily. 04/06/18  Yes [provider]  magnesium oxide (MAG-OX) 400 MG tablet Take 400 mg by mouth daily.   Yes [provider]  metFORMIN (GLUCOPHAGE) 500 MG tablet Take 500-1,000 mg by mouth 2 (two) times daily. 1000MG -AM,  500MG -PM 04/06/18  Yes [provider]  Multiple Vitamin (MULTI-VITAMINS) TABS Take 1 tablet by mouth daily.   Yes [provider]  omega-3 acid ethyl esters (LOVAZA) 1 g capsule Take 1 g by mouth 2 (two) times daily.   Yes [provider]  omeprazole  (PRILOSEC) 20 MG capsule Take 1 capsule by mouth daily. 04/06/18  Yes [provider]  rOPINIRole (REQUIP) 1 MG tablet Take 1 tablet by mouth 2 (two) times daily. 02/16/18  Yes [provider]  insulin aspart (NOVOLOG FLEXPEN) 100 UNIT/ML FlexPen WAITING FOR PRIOR AUTHORIZATION HAS NOT STARTED YET    [provider]      PHYSICAL EXAMINATION:   VITAL SIGNS: Blood pressure (!) 195/98, pulse 95, temperature (!) 102.8 F (39.3 C), temperature source Rectal, resp. rate (!) 23, SpO2 100 %.  GENERAL:  82 y.o.-year-old patient lying in the bed with no acute distress.  Frail, obese  eYES: Pupils equal, round, reactive to light and accommodation. No scleral icterus. Extraocular muscles intact.  HEENT: Head atraumatic, normocephalic. Oropharynx and nasopharynx clear.  NECK:  Supple, no jugular venous distention. No thyroid enlargement, no tenderness.  LUNGS: Normal breath sounds bilaterally, no wheezing, rales,rhonchi or crepitation. No use of accessory muscles of respiration.  CARDIOVASCULAR: S1, S2 normal. No murmurs, rubs, or gallops.  ABDOMEN: Soft, nontender, nondistended. Bowel sounds present. No organomegaly or mass.  EXTREMITIES: No pedal edema, cyanosis, or clubbing.  NEUROLOGIC: Pupils equally round reactive to light, no eye opening to painful stimulation, bilateral decerebrate posturing, nuchal rigidity noted  PSYCHIATRIC: The patient is comatose   SKIN: No obvious rash, lesion, or ulcer.   LABORATORY PANEL:   CBC Recent Labs  Lab 04/18/18 1208  WBC 16.3*  HGB 13.5  HCT 42.2  PLT 200  MCV 77.8*  MCH 24.8*  MCHC 31.9*  RDW 14.5  LYMPHSABS 0.8*  MONOABS 0.3  EOSABS 0.0  BASOSABS 0.0   ------------------------------------------------------------------------------------------------------------------  Chemistries  Recent Labs  Lab 04/18/18 1208  NA 139  K 3.3*  CL 98  CO2 25  GLUCOSE 253*  BUN 15  CREATININE 1.05*  CALCIUM 9.7  AST 37  ALT  22  ALKPHOS 46  BILITOT 1.1   ------------------------------------------------------------------------------------------------------------------ CrCl cannot be calculated (Unknown ideal weight.). ------------------------------------------------------------------------------------------------------------------ Recent Labs    04/18/18 1208  TSH 1.456     Coagulation profile Recent Labs  Lab 04/18/18 1419  INR 1.05   ------------------------------------------------------------------------------------------------------------------- No results for input(s): DDIMER in the last 72 hours. -------------------------------------------------------------------------------------------------------------------  Cardiac Enzymes Recent Labs  Lab 04/18/18 1208  TROPONINI 0.03*   ------------------------------------------------------------------------------------------------------------------ Invalid input(s): POCBNP  ---------------------------------------------------------------------------------------------------------------  Urinalysis    Component Value Date/Time   COLORURINE STRAW (A) 04/18/2018 1208   APPEARANCEUR CLEAR (A) 04/18/2018 1208   APPEARANCEUR Clear 09/28/2014 2014   LABSPEC 1.006 04/18/2018 1208   LABSPEC 1.035 09/28/2014 2014   PHURINE 8.0 04/18/2018 1208   GLUCOSEU >=500 (A) 04/18/2018 1208   GLUCOSEU Negative 09/28/2014 2014   HGBUR SMALL (A) 04/18/2018 1208   BILIRUBINUR NEGATIVE 04/18/2018 1208   BILIRUBINUR 1+ 09/28/2014 2014   KETONESUR 20 (A) 04/18/2018 1208   PROTEINUR NEGATIVE 04/18/2018 1208   NITRITE NEGATIVE 04/18/2018 1208   LEUKOCYTESUR NEGATIVE 04/18/2018 1208   LEUKOCYTESUR Trace 09/28/2014 2014     RADIOLOGY: Ct Angio Head W Or Wo Contrast  Result Date: 04/18/2018 CLINICAL DATA:  Found unresponsive this morning. EXAM: CT ANGIOGRAPHY HEAD AND NECK TECHNIQUE: Multidetector CT imaging of the head and neck was performed using the standard  protocol  during bolus administration of intravenous contrast. Multiplanar CT image reconstructions and MIPs were obtained to evaluate the vascular anatomy. Carotid stenosis measurements (when applicable) are obtained utilizing NASCET criteria, using the distal internal carotid diameter as the denominator. CONTRAST:  75mL ISOVUE-370 IOPAMIDOL (ISOVUE-370) INJECTION 76% COMPARISON:  Head CT earlier same day. FINDINGS: CTA NECK FINDINGS Aortic arch: Mild atherosclerotic calcification. No aneurysm or dissection. Branching pattern of the brachiocephalic vessels is normal without stenosis. Right carotid system: Common carotid artery widely patent to the bifurcation region. Calcified plaque at the carotid bifurcation and ICA bulb. Minimal diameter is 3.5 mm. Compared to a more distal cervical ICA diameter of 5 mm, this indicates a 30% stenosis. Left carotid system: Common carotid artery widely patent to the bifurcation. Mild atherosclerotic calcification at the carotid bifurcation but no stenosis. Cervical ICA is widely patent. Vertebral arteries: Both vertebral artery origins are widely patent. Both vertebral arteries are widely patent through the cervical region to the foramen magnum. Skeleton: Ordinary cervical spondylosis. Other neck: No soft tissue lesion. Upper chest: Negative.  Patient is intubated. Review of the MIP images confirms the above findings CTA HEAD FINDINGS Anterior circulation: Both internal carotid arteries are widely patent through the skull base. There is atherosclerotic calcification in the carotid siphon regions but no stenosis. The anterior and middle cerebral vessels are patent without proximal stenosis, aneurysm or vascular malformation. No missing branch vessels are identified. Left A1 segment is congenitally small. Posterior circulation: Both vertebral arteries are patent at the foramen magnum level. There is atherosclerotic calcification in both V4 segments with stenosis estimated at about  30% on each side. No advanced stenosis. The basilar is widely patent. Posterior circulation branch vessels appear normal. Venous sinuses: Patent and normal. Anatomic variants: None significant. Delayed phase: No abnormal enhancement. Review of the MIP images confirms the above findings IMPRESSION: No acute vascular finding.  No large or medium vessel occlusion. Atherosclerotic calcification at the carotid bifurcation and ICA bulb on the right. Maximal stenosis is measured at only 30% however. Minimal atherosclerosis at the left carotid bifurcation without stenosis. Mild atherosclerosis at the V4 segments of both vertebral arteries but without stenosis greater than 30%. Electronically Signed   By: Paulina Fusi M.D.   On: 04/18/2018 13:21   Ct Angio Neck W Or Wo Contrast  Result Date: 04/18/2018 CLINICAL DATA:  Found unresponsive this morning. EXAM: CT ANGIOGRAPHY HEAD AND NECK TECHNIQUE: Multidetector CT imaging of the head and neck was performed using the standard protocol during bolus administration of intravenous contrast. Multiplanar CT image reconstructions and MIPs were obtained to evaluate the vascular anatomy. Carotid stenosis measurements (when applicable) are obtained utilizing NASCET criteria, using the distal internal carotid diameter as the denominator. CONTRAST:  75mL ISOVUE-370 IOPAMIDOL (ISOVUE-370) INJECTION 76% COMPARISON:  Head CT earlier same day. FINDINGS: CTA NECK FINDINGS Aortic arch: Mild atherosclerotic calcification. No aneurysm or dissection. Branching pattern of the brachiocephalic vessels is normal without stenosis. Right carotid system: Common carotid artery widely patent to the bifurcation region. Calcified plaque at the carotid bifurcation and ICA bulb. Minimal diameter is 3.5 mm. Compared to a more distal cervical ICA diameter of 5 mm, this indicates a 30% stenosis. Left carotid system: Common carotid artery widely patent to the bifurcation. Mild atherosclerotic calcification at  the carotid bifurcation but no stenosis. Cervical ICA is widely patent. Vertebral arteries: Both vertebral artery origins are widely patent. Both vertebral arteries are widely patent through the cervical region to the foramen magnum. Skeleton: Ordinary cervical spondylosis. Other  neck: No soft tissue lesion. Upper chest: Negative.  Patient is intubated. Review of the MIP images confirms the above findings CTA HEAD FINDINGS Anterior circulation: Both internal carotid arteries are widely patent through the skull base. There is atherosclerotic calcification in the carotid siphon regions but no stenosis. The anterior and middle cerebral vessels are patent without proximal stenosis, aneurysm or vascular malformation. No missing branch vessels are identified. Left A1 segment is congenitally small. Posterior circulation: Both vertebral arteries are patent at the foramen magnum level. There is atherosclerotic calcification in both V4 segments with stenosis estimated at about 30% on each side. No advanced stenosis. The basilar is widely patent. Posterior circulation branch vessels appear normal. Venous sinuses: Patent and normal. Anatomic variants: None significant. Delayed phase: No abnormal enhancement. Review of the MIP images confirms the above findings IMPRESSION: No acute vascular finding.  No large or medium vessel occlusion. Atherosclerotic calcification at the carotid bifurcation and ICA bulb on the right. Maximal stenosis is measured at only 30% however. Minimal atherosclerosis at the left carotid bifurcation without stenosis. Mild atherosclerosis at the V4 segments of both vertebral arteries but without stenosis greater than 30%. Electronically Signed   By: Paulina FusiMark  Shogry M.D.   On: 04/18/2018 13:21   Dg Chest Port 1 View  Result Date: 04/18/2018 CLINICAL DATA:  Intubated patient, sepsis, orogastric tube placed. EXAM: PORTABLE CHEST 1 VIEW COMPARISON:  Chest x-ray of June 06, 2014 FINDINGS: The lungs are  borderline hypoinflated but clear. The endotracheal tube tip projects 3 cm above the carina. There is no pleural effusion or pneumothorax. The heart and pulmonary vascularity are normal. The esophagogastric tubes tip in proximal port project in the distal stomach. IMPRESSION: Intubated patient.  No acute pneumonia nor CHF. Reasonable positioning of the endotracheal tube and esophagogastric tube. Electronically Signed   By: David  SwazilandJordan M.D.   On: 04/18/2018 13:56   Ct Head Code Stroke Wo Contrast`  Result Date: 04/18/2018 CLINICAL DATA:  Code stroke. Found unresponsive at home today. Last seen normal last night. EXAM: CT HEAD WITHOUT CONTRAST TECHNIQUE: Contiguous axial images were obtained from the base of the skull through the vertex without intravenous contrast. COMPARISON:  02/01/2018 FINDINGS: Brain: There are extensive chronic small-vessel ischemic changes throughout the cerebral hemispheric white matter. No identifiable acute infarction, mass lesion, hemorrhage, hydrocephalus or extra-axial collection. Vascular: There is atherosclerotic calcification of the major vessels at the base of the brain. Skull: Negative Sinuses/Orbits: Clear/negative Other: None ASPECTS (Alberta Stroke Program Early CT Score) - Ganglionic level infarction (caudate, lentiform nuclei, internal capsule, insula, M1-M3 cortex): 7 - Supraganglionic infarction (M4-M6 cortex): 3 Total score (0-10 with 10 being normal): 10 IMPRESSION: 1. No acute finding by CT. Extensive chronic small-vessel ischemic changes of the cerebral hemispheric white matter. 2. ASPECTS is 10. 3. These results were called by telephone at the time of interpretation on 04/18/2018 at 12:48 pm to Dr. Sharman CheekPHILLIP STAFFORD , who verbally acknowledged these results. Electronically Signed   By: Paulina FusiMark  Shogry M.D.   On: 04/18/2018 12:50    EKG: Orders placed or performed during the hospital encounter of 04/18/18  . ED EKG 12-Lead  . ED EKG 12-Lead    IMPRESSION AND  PLAN: *Acute sepsis Suspected due to acute bacterial meningitis LP unable to be done given use of Eliquis for DVT Admit to ICU on our meningitis/ICU protocol, isolation precautions, empiric IV vancomycin/Rocephin, follow-up on cultures, neurochecks per routine, IV fluids for rehydration  *Acute suspected bacterial meningitis antibiotics per above,  IV fluids for rehydration , Neurology consult if expert opinion, and all other plans as stated above  *Acute comatose state Most likely secondary to above, cannot exclude possible acute stroke versus seizure Avoid psychotropic meds, neurochecks per routine, aspiration/fall precautions, ventilator with weaning as tolerated, check ammonia level, check RPR, consider MRI of the brain for further evaluation when feasible, check EEG  *Chronic diabetes mellitus type 2 Hold metformin, sliding scale insulin Accu-Cheks per routine, check hemoglobin A1c determine level control  *Chronic benign essential hypertension We will allow for permissive hypertension in the event of possible stroke, IV hydralazine as needed systolic blood pressure greater than 180, vitals per routine, make changes as per necessary  *Acute hypokalemia Repleted with IV potassium, check magnesium level, BMP in the morning  *History of DVT Hold Eliquis for now, SCDs   All the records are reviewed and case discussed with ED provider. Management plans discussed with the patient, family and they are in agreement.  CODE STATUS:full    Code Status Orders  (From admission, onward)        Start     Ordered   04/18/18 1541  Full code  Continuous     04/18/18 1541    Code Status History    This patient has a current code status but no historical code status.       TOTAL TIME TAKING CARE OF THIS PATIENT: 45 minutes.    Evelena Asa Salary M.D on 04/18/2018   Between 7am to 6pm - Pager - [phone]  After 6pm go to [email or web address] - password [employer]  [facility] [place]  [facility]  Office  [phone]  CC: Primary care physician; Barbette Reichmann, MD   Note: This dictation was prepared with Dragon dictation along with smaller phrase technology. Any transcriptional errors that result from this process are unintentional.

## 2018-04-18 NOTE — ED Notes (Signed)
Code  Stroke  Called  To 333 

## 2018-04-18 NOTE — ED Notes (Signed)
Date and time results received: 04/18/18 1600 (use smartphrase ".now" to insert current time)  Test: Lactic Acid Critical Value: 3.4  Name of Provider Notified: Scotty CourtStafford  Orders Received? Or Actions Taken?: IV fluids infusing

## 2018-04-18 NOTE — Progress Notes (Signed)
Pharmacy Antibiotic Note  Natalie Knight is a 82 y.o. female admitted on 04/18/2018 with meningitis.  Pharmacy has been consulted for vancomycin, ceftriaxone and ampicillin dosing. She has a known history which includes anemia chronic disease, dementia, gout, DVT on Eliquis presenting from her apartment, found unresponsive in the bed, patient nonverbal/not following commands/no eye-opening, family member started CPR, EMS brought to the emergency room, patient noted to be comatose then intubated.   Plan: Vancomycin 1000mg  IV every 18 hours beginning 6 hours after 1st dose in the ED.  Goal trough 15-20 mcg/mL. K: 0.043, Vd 48L, T1/2: 16h, calculated concentrations at steady state: 38.1/18.4 mcg/mL.  Vt scheduled prior to the 5th scheduled dose. Ampicillin dose is 2 grams every 4 hours. Ceftriaxone dose is 2 grams every 12 hours.    Temp (24hrs), Avg:102.8 F (39.3 C), Min:102.8 F (39.3 C), Max:102.8 F (39.3 C)  Recent Labs  Lab 04/18/18 1208  WBC 16.3*  CREATININE 1.05*    No Known Allergies  Antimicrobials this admission: Ampicillin 8/6 >>  Vancomycin 8/6 >>  Ceftriaxone 8/6>> Cefepime 8/6 x1 Acyclovir 8/6  Microbiology results: 8/6 BCx: pending 8/6 UCx: pending   Thank you for allowing pharmacy to be a part of this patient's care.  Lowella Bandyodney D Advaith Lamarque, PharmD 04/18/2018 2:43 PM

## 2018-04-18 NOTE — ED Provider Notes (Addendum)
[facility] Emergency Department Provider Note  ____________________________________________  Time seen: Approximately 2:40 PM  I have reviewed the triage vital signs and the nursing notes.   HISTORY  Chief Complaint Altered Mental Status  Level 5 Caveat: Portions of the History and Physical including HPI and review of systems are unable to be completely obtained due to altered mental status   HPI Natalie Knight is a 82 y.o. female with a history of hypertension and diabetes  who was found unresponsive in her apartment this morning.  Last night she was seen by friends and was in her usual state of health.  This morning, when found unresponsive, friends perform chest compressions until EMS arrived who noted the patient to be responsive to pain not in cardiac arrest but overall comatose.  She is breathing spontaneously and I transported to hospital on nasal cannula.  No history of seizures or recent trauma.     Past Medical History:  Diagnosis Date  . Diabetes mellitus without complication (HCC)   . Hypertension      There are no active problems to display for this patient.    No past surgical history on file.   Prior to Admission medications   Medication Sig Start Date End Date Taking? Authorizing Provider  bisacodyl (DULCOLAX) 5 MG EC tablet Take 1 tablet by mouth daily as needed.   Yes [provider]  cetirizine (ZYRTEC) 10 MG tablet Take 1 tablet by mouth daily.   Yes [provider]  docusate sodium (COLACE) 100 MG capsule Take 100 mg by mouth 2 (two) times daily as needed for mild constipation.   Yes [provider]  ELIQUIS 2.5 MG TABS tablet Take 1 tablet by mouth 2 (two) times daily. 03/31/18  Yes [provider]  escitalopram (LEXAPRO) 20 MG tablet Take 1 tablet by mouth daily. 03/20/18  Yes [provider]  gabapentin (NEURONTIN) 100 MG capsule Take 1 capsule by mouth 3 (three) times daily. 03/31/18   Yes [provider]  losartan (COZAAR) 100 MG tablet Take 1 tablet by mouth daily. 04/06/18  Yes [provider]  magnesium oxide (MAG-OX) 400 MG tablet Take 400 mg by mouth daily.   Yes [provider]  metFORMIN (GLUCOPHAGE) 500 MG tablet Take 500-1,000 mg by mouth 2 (two) times daily. 1000MG -AM,  500MG -PM 04/06/18  Yes [provider]  Multiple Vitamin (MULTI-VITAMINS) TABS Take 1 tablet by mouth daily.   Yes [provider]  omega-3 acid ethyl esters (LOVAZA) 1 g capsule Take 1 g by mouth 2 (two) times daily.   Yes [provider]  omeprazole (PRILOSEC) 20 MG capsule Take 1 capsule by mouth daily. 04/06/18  Yes [provider]  rOPINIRole (REQUIP) 1 MG tablet Take 1 tablet by mouth 2 (two) times daily. 02/16/18  Yes [provider]  insulin aspart (NOVOLOG FLEXPEN) 100 UNIT/ML FlexPen WAITING FOR PRIOR AUTHORIZATION HAS NOT STARTED YET    [provider]     Allergies Patient has no known allergies.   No family history on file.  Social History Social History   Tobacco Use  . Smoking status: Never Smoker  . Smokeless tobacco: Current User    Types: Snuff  Substance Use Topics  . Alcohol use: No  . Drug use: Not on file    Review of Systems Review of systems unable to be obtained due to altered mental status   ____________________________________________   PHYSICAL EXAM:  VITAL SIGNS: ED Triage Vitals [  04/18/18 1158]  Enc Vitals Group     BP (!) 152/128     Pulse Rate (!) 103     Resp 18     Temp (!) 102.8 F (39.3 C)     Temp Source Rectal     SpO2 96 %     Weight      Height      Head Circumference      Peak Flow      Pain Score      Pain Loc      Pain Edu?      Excl. in GC?     Vital signs reviewed, nursing assessments reviewed.   Constitutional:   Comatose Eyes:   Conjunctivae are normal.  Gaze fixed to the left.  Pupils equal at 2 to 3 mm, nonreactive. ENT      Head:    Normocephalic and atraumatic.      Nose:   No congestion, no epistaxis     Mouth/Throat:   Dry mucous membranes, no pharyngeal erythema. No peritonsillar mass.       Neck: No apparent trauma. Hematological/Lymphatic/Immunilogical:   No cervical lymphadenopathy. Cardiovascular:   Tachycardia heart rate 110. Symmetric bilateral radial and DP pulses.  No murmurs. Cap refill less than 2 seconds. Respiratory:   Normal respiratory effort without tachypnea/retractions. Breath sounds are clear and equal bilaterally. No wheezes/rales/rhonchi. Gastrointestinal:   Soft and nontender. Non distended. There is no CVA tenderness.  No rebound, rigidity, or guarding. Genitourinary:   Normal externally Musculoskeletal:   Normal range of motion in all extremities. No joint effusions.  No lower extremity tenderness.  No edema.  No signs of trauma Neurologic:   Comatose.  Moves all extremities in response to pain. There is posturing at rest GCS = E1V1M3 = 5 Skin:    Skin is warm, dry and intact. No rash noted.  No petechiae, purpura, or bullae.  ____________________________________________    LABS (pertinent positives/negatives) (all labs ordered are listed, but only abnormal results are displayed) Labs Reviewed  COMPREHENSIVE METABOLIC PANEL - Abnormal; Notable for the following components:      Result Value   Potassium 3.3 (*)    Glucose, Bld 253 (*)    Creatinine, Ser 1.05 (*)    GFR calc non Af Amer 49 (*)    GFR calc Af Amer 57 (*)    Anion gap 16 (*)    All other components within normal limits  TROPONIN I - Abnormal; Notable for the following components:   Troponin I 0.03 (*)    All other components within normal limits  CBC WITH DIFFERENTIAL/PLATELET - Abnormal; Notable for the following components:   WBC 16.3 (*)    RBC 5.43 (*)    MCV 77.8 (*)    MCH 24.8 (*)    MCHC 31.9 (*)    Neutro Abs 15.1 (*)    Lymphs Abs 0.8 (*)    All other components within normal limits  URINALYSIS, ROUTINE W  REFLEX MICROSCOPIC - Abnormal; Notable for the following components:   Color, Urine STRAW (*)    APPearance CLEAR (*)    Glucose, UA >=500 (*)    Hgb urine dipstick SMALL (*)    Ketones, ur 20 (*)    All other components within normal limits  ACETAMINOPHEN LEVEL - Abnormal; Notable for the following components:   Acetaminophen (Tylenol), Serum <10 (*)    All other components within normal limits  GLUCOSE, CAPILLARY - Abnormal; Notable for  the following components:   Glucose-Capillary 235 (*)    All other components within normal limits  CULTURE, BLOOD (ROUTINE X 2)  CULTURE, BLOOD (ROUTINE X 2)  URINE CULTURE  LIPASE, BLOOD  PROCALCITONIN  CK  TSH  T4, FREE  SALICYLATE LEVEL  LACTIC ACID, PLASMA  PROTIME-INR  LACTIC ACID, PLASMA   ____________________________________________   EKG    ____________________________________________    RADIOLOGY  Ct Angio Head W Or Wo Contrast  Result Date: 04/18/2018 CLINICAL DATA:  Found unresponsive this morning. EXAM: CT ANGIOGRAPHY HEAD AND NECK TECHNIQUE: Multidetector CT imaging of the head and neck was performed using the standard protocol during bolus administration of intravenous contrast. Multiplanar CT image reconstructions and MIPs were obtained to evaluate the vascular anatomy. Carotid stenosis measurements (when applicable) are obtained utilizing NASCET criteria, using the distal internal carotid diameter as the denominator. CONTRAST:  75mL ISOVUE-370 IOPAMIDOL (ISOVUE-370) INJECTION 76% COMPARISON:  Head CT earlier same day. FINDINGS: CTA NECK FINDINGS Aortic arch: Mild atherosclerotic calcification. No aneurysm or dissection. Branching pattern of the brachiocephalic vessels is normal without stenosis. Right carotid system: Common carotid artery widely patent to the bifurcation region. Calcified plaque at the carotid bifurcation and ICA bulb. Minimal diameter is 3.5 mm. Compared to a more distal cervical ICA diameter of 5 mm, this  indicates a 30% stenosis. Left carotid system: Common carotid artery widely patent to the bifurcation. Mild atherosclerotic calcification at the carotid bifurcation but no stenosis. Cervical ICA is widely patent. Vertebral arteries: Both vertebral artery origins are widely patent. Both vertebral arteries are widely patent through the cervical region to the foramen magnum. Skeleton: Ordinary cervical spondylosis. Other neck: No soft tissue lesion. Upper chest: Negative.  Patient is intubated. Review of the MIP images confirms the above findings CTA HEAD FINDINGS Anterior circulation: Both internal carotid arteries are widely patent through the skull base. There is atherosclerotic calcification in the carotid siphon regions but no stenosis. The anterior and middle cerebral vessels are patent without proximal stenosis, aneurysm or vascular malformation. No missing branch vessels are identified. Left A1 segment is congenitally small. Posterior circulation: Both vertebral arteries are patent at the foramen magnum level. There is atherosclerotic calcification in both V4 segments with stenosis estimated at about 30% on each side. No advanced stenosis. The basilar is widely patent. Posterior circulation branch vessels appear normal. Venous sinuses: Patent and normal. Anatomic variants: None significant. Delayed phase: No abnormal enhancement. Review of the MIP images confirms the above findings IMPRESSION: No acute vascular finding.  No large or medium vessel occlusion. Atherosclerotic calcification at the carotid bifurcation and ICA bulb on the right. Maximal stenosis is measured at only 30% however. Minimal atherosclerosis at the left carotid bifurcation without stenosis. Mild atherosclerosis at the V4 segments of both vertebral arteries but without stenosis greater than 30%. Electronically Signed   By: Paulina Fusi M.D.   On: 04/18/2018 13:21   Ct Angio Neck W Or Wo Contrast  Result Date: 04/18/2018 CLINICAL DATA:   Found unresponsive this morning. EXAM: CT ANGIOGRAPHY HEAD AND NECK TECHNIQUE: Multidetector CT imaging of the head and neck was performed using the standard protocol during bolus administration of intravenous contrast. Multiplanar CT image reconstructions and MIPs were obtained to evaluate the vascular anatomy. Carotid stenosis measurements (when applicable) are obtained utilizing NASCET criteria, using the distal internal carotid diameter as the denominator. CONTRAST:  75mL ISOVUE-370 IOPAMIDOL (ISOVUE-370) INJECTION 76% COMPARISON:  Head CT earlier same day. FINDINGS: CTA NECK FINDINGS Aortic arch: Mild atherosclerotic  calcification. No aneurysm or dissection. Branching pattern of the brachiocephalic vessels is normal without stenosis. Right carotid system: Common carotid artery widely patent to the bifurcation region. Calcified plaque at the carotid bifurcation and ICA bulb. Minimal diameter is 3.5 mm. Compared to a more distal cervical ICA diameter of 5 mm, this indicates a 30% stenosis. Left carotid system: Common carotid artery widely patent to the bifurcation. Mild atherosclerotic calcification at the carotid bifurcation but no stenosis. Cervical ICA is widely patent. Vertebral arteries: Both vertebral artery origins are widely patent. Both vertebral arteries are widely patent through the cervical region to the foramen magnum. Skeleton: Ordinary cervical spondylosis. Other neck: No soft tissue lesion. Upper chest: Negative.  Patient is intubated. Review of the MIP images confirms the above findings CTA HEAD FINDINGS Anterior circulation: Both internal carotid arteries are widely patent through the skull base. There is atherosclerotic calcification in the carotid siphon regions but no stenosis. The anterior and middle cerebral vessels are patent without proximal stenosis, aneurysm or vascular malformation. No missing branch vessels are identified. Left A1 segment is congenitally small. Posterior circulation:  Both vertebral arteries are patent at the foramen magnum level. There is atherosclerotic calcification in both V4 segments with stenosis estimated at about 30% on each side. No advanced stenosis. The basilar is widely patent. Posterior circulation branch vessels appear normal. Venous sinuses: Patent and normal. Anatomic variants: None significant. Delayed phase: No abnormal enhancement. Review of the MIP images confirms the above findings IMPRESSION: No acute vascular finding.  No large or medium vessel occlusion. Atherosclerotic calcification at the carotid bifurcation and ICA bulb on the right. Maximal stenosis is measured at only 30% however. Minimal atherosclerosis at the left carotid bifurcation without stenosis. Mild atherosclerosis at the V4 segments of both vertebral arteries but without stenosis greater than 30%. Electronically Signed   By: Paulina Fusi M.D.   On: 04/18/2018 13:21   Dg Chest Port 1 View  Result Date: 04/18/2018 CLINICAL DATA:  Intubated patient, sepsis, orogastric tube placed. EXAM: PORTABLE CHEST 1 VIEW COMPARISON:  Chest x-ray of June 06, 2014 FINDINGS: The lungs are borderline hypoinflated but clear. The endotracheal tube tip projects 3 cm above the carina. There is no pleural effusion or pneumothorax. The heart and pulmonary vascularity are normal. The esophagogastric tubes tip in proximal port project in the distal stomach. IMPRESSION: Intubated patient.  No acute pneumonia nor CHF. Reasonable positioning of the endotracheal tube and esophagogastric tube. Electronically Signed   By: David  [place] M.D.   On: 04/18/2018 13:56   Ct Head Code Stroke Wo Contrast`  Result Date: 04/18/2018 CLINICAL DATA:  Code stroke. Found unresponsive at home today. Last seen normal last night. EXAM: CT HEAD WITHOUT CONTRAST TECHNIQUE: Contiguous axial images were obtained from the base of the skull through the vertex without intravenous contrast. COMPARISON:  02/01/2018 FINDINGS: Brain: There  are extensive chronic small-vessel ischemic changes throughout the cerebral hemispheric white matter. No identifiable acute infarction, mass lesion, hemorrhage, hydrocephalus or extra-axial collection. Vascular: There is atherosclerotic calcification of the major vessels at the base of the brain. Skull: Negative Sinuses/Orbits: Clear/negative Other: None ASPECTS (Alberta Stroke Program Early CT Score) - Ganglionic level infarction (caudate, lentiform nuclei, internal capsule, insula, M1-M3 cortex): 7 - Supraganglionic infarction (M4-M6 cortex): 3 Total score (0-10 with 10 being normal): 10 IMPRESSION: 1. No acute finding by CT. Extensive chronic small-vessel ischemic changes of the cerebral hemispheric white matter. 2. ASPECTS is 10. 3. These results were called by telephone at the  time of interpretation on 04/18/2018 at 12:48 pm to Dr. Sharman Cheek , who verbally acknowledged these results. Electronically Signed   By: Paulina Fusi M.D.   On: 04/18/2018 12:50    ____________________________________________   PROCEDURES .Critical Care Performed by: Sharman Cheek, MD Authorized by: Sharman Cheek, MD   Critical care provider statement:    Critical care time (minutes):  40   Critical care time was exclusive of:  Separately billable procedures and treating other patients   Critical care was necessary to treat or prevent imminent or life-threatening deterioration of the following conditions:  CNS failure or compromise, respiratory failure, sepsis and toxidrome   Critical care was time spent personally by me on the following activities:  Development of treatment plan with patient or surrogate, discussions with consultants, evaluation of patient's response to treatment, examination of patient, obtaining history from patient or surrogate, ordering and performing treatments and interventions, ordering and review of laboratory studies, ordering and review of radiographic studies, pulse oximetry,  re-evaluation of patient's condition and review of old charts Comments:        Procedure Name: Intubation Date/Time: 04/18/2018 2:53 PM Performed by: Sharman Cheek, MD Pre-anesthesia Checklist: Patient identified, Patient being monitored, Emergency Drugs available, Timeout performed and Suction available Oxygen Delivery Method: Non-rebreather mask Preoxygenation: Pre-oxygenation with 100% oxygen Induction Type: Rapid sequence Ventilation: Mask ventilation without difficulty Laryngoscope Size: Glidescope and 3 Grade View: Grade I Tube type: Subglottic suction tube Tube size: 7.5 mm Number of attempts: 1 Placement Confirmation: ETT inserted through vocal cords under direct vision,  CO2 detector and Breath sounds checked- equal and bilateral Tube secured with: ETT holder       ____________________________________________  DIFFERENTIAL DIAGNOSIS   Ischemic stroke with possible large vessel occlusion, hemorrhagic stroke, status epilepticus, meningitis, NMS, serotonin syndrome  CLINICAL IMPRESSION / ASSESSMENT AND PLAN / ED COURSE  Pertinent labs & imaging results that were available during my care of the patient were reviewed by me and considered in my medical decision making (see chart for details).      Clinical Course as of Apr 18 1630  Tue Apr 18, 2018  1213 Intubated on arrival for airway protection given low GCS, suspected LVO stroke vs intracranial hemorrhage vs status epilepticus vs meningitis.  Code sepsis. Code stroke. Stat CT head requested.   [PS]  1241 Cxr shows ETT in good position. OG in position. No pna / ptx.  Nursing unable to draw blood from PIV.  Due to need for emergent CT head, labs delayed until after CT completed to not delay diagnosis of potential surgical emergency.   [PS]  1246 No obvious infarct or hemorrhage on CT. Will f/u rad report   [PS]  1247 UA non infectious   [PS]  1316 D/w Dr. Thad Ranger, agrees with proceeding to LP if CTA/CTP  negative.    [PS]  1339 CTA negative. Will add acyclovir, steroids, ampillicin if needed. Proceed to LP.    [PS]  1429 Pt on eliquis. Will hold off doing LP at this time due to elevated risk of epidural hematoma. Added ampicillin and acyclovir. Will admit.    [PS]  1628 Lactate resulted at 3.4.  Additional 1 L bolus ordered.  Not meeting criteria for septic shock, no hypotension.   [PS]    Clinical Course User Index [PS] Sharman Cheek, MD     ____________________________________________   FINAL CLINICAL IMPRESSION(S) / ED DIAGNOSES    Final diagnoses:  Sepsis, due to unspecified organism [facility])  Meningitis  Status epilepticus [facility])     ED Discharge Orders    None      Portions of this note were generated with dragon dictation software. Dictation errors may occur despite best attempts at proofreading.    Sharman Cheek, MD 04/18/18 1454    Sharman Cheek, MD 04/18/18 [phone]

## 2018-04-18 NOTE — ED Triage Notes (Signed)
Pt arrived via EMS from home. Pt was seen last night according to friends and was well was found in her apartment unresponsive this morning. Compressions were started by friends. When EMS arrived patient responded to pain.

## 2018-04-18 NOTE — Progress Notes (Signed)
Pharmacy Antibiotic Note  Natalie Knight is a 82 y.o. female adFuller Planmitted on 04/18/2018 with meningitis.  Pharmacy has been consulted for vancomycin, ceftriaxone and ampicillin dosing. She has a known history which includes anemia chronic disease, dementia, gout, DVT on Eliquis presenting from her apartment, found unresponsive in the bed, patient nonverbal/not following commands/no eye-opening, family member started CPR, EMS brought to the emergency room, patient noted to be comatose then intubated.   Plan: Vancomycin 1g IV Q24hr for goal trough of 15-20.   Ampicillin 2g IV Q6hr.   Acyclovir 700mg  IV Q12hr.   Ceftriaxone 2g IV Q12hr.   Dexamethazone 10mg  IV Q6hr.    Height: 5\' 5"  (165.1 cm) Weight: 152 lb 5.4 oz (69.1 kg) IBW/kg (Calculated) : 57  Temp (24hrs), Avg:103.8 F (39.9 C), Min:102.8 F (39.3 C), Max:104.7 F (40.4 C)  Recent Labs  Lab 04/18/18 1208 04/18/18 1527  WBC 16.3*  --   CREATININE 1.05*  --   LATICACIDVEN  --  3.4*    No Known Allergies  Antimicrobials this admission: Cefepime 8/6 x1  Ampicillin 8/6 >>  Vancomycin 8/6 >>  Ceftriaxone 8/6>> Acyclovir 8/6 >>  Microbiology results: 8/6 BCx: pending 8/6 UCx: pending   Thank you for allowing pharmacy to be a part of this patient's care.  Cong Hightower L,  04/18/2018 5:35 PM

## 2018-04-18 NOTE — Progress Notes (Signed)
CODE SEPSIS - PHARMACY COMMUNICATION  **Broad Spectrum Antibiotics should be administered within 1 hour of Sepsis diagnosis**  Time Code Sepsis Called/Page Received: 1200  Antibiotics Ordered: vancomycin and cefepime  Time of 1st antibiotic administration: 1338  Additional action taken by pharmacy: contacted MD for antibiotic orders and RN to remind of one hour administration window  If necessary, Name of Provider/Nurse Contacted: Rifenbark, MD and Christina RN    Natalie Knight D Natalie Knight ,PharmD Clinical Pharmacist  04/18/2018  1:47 PM

## 2018-04-18 NOTE — Procedures (Signed)
Central Venous Catheter Insertion Procedure Note Natalie Knight [id number] July 03, 1936  Procedure: Insertion of Central Venous Catheter Indications: Assessment of intravascular volume, Drug and/or fluid administration and Frequent blood sampling  Procedure Details Consent: Risks of procedure as well as the alternatives and risks of each were explained to the (patient/caregiver).  Consent for procedure obtained. Time Out: Verified patient identification, verified procedure, site/side was marked, verified correct patient position, special equipment/implants available, medications/allergies/relevent history reviewed, required imaging and test results available.  Performed  Maximum sterile technique was used including antiseptics, cap, gloves, gown, hand hygiene, mask and sheet. Skin prep: Chlorhexidine; local anesthetic administered A antimicrobial bonded/coated triple lumen catheter was placed in the right internal jugular vein using the Seldinger technique.  Evaluation Blood flow good Complications: No apparent complications Patient did tolerate procedure well. Chest X-ray ordered to verify placement.  CXR: pending.  Right internal jugular central line placed utilizing ultrasound no complications noted during or following procedure.  Natalie Knight, AGNP  Pulmonary/Critical Care Pager [phone] (please enter 7 digits) PCCM Consult Pager [phone] (please enter 7 digits)

## 2018-04-18 NOTE — ED Notes (Signed)
Date and time results received: 04/18/18 1225 (use smartphrase ".now" to insert current time)  Test: Blood pressure Critical Value: 211/122  Name of Provider Notified: MD Scotty CourtStafford  Orders Received? Or Actions Taken?: No orders received

## 2018-04-18 NOTE — ED Notes (Signed)
Patient transported to CT with RN and respiratory 

## 2018-04-18 NOTE — Plan of Care (Signed)
Family oriented to ICU policy

## 2018-04-18 NOTE — ED Notes (Signed)
Date and time results received: 04/18/18 1400 (use smartphrase ".now" to insert current time)  Test: Troponin  Critical Value: 0.03  Name of Provider Notified: Scotty CourtStafford   Orders Received? Or Actions Taken?: No orders received

## 2018-04-18 NOTE — Progress Notes (Addendum)
ANTICOAGULATION CONSULT NOTE  Pharmacy Consult for heparin drip management  Indication: DVT  No Known Allergies  Patient Measurements: Height: 5\' 5"  (165.1 cm) Weight: 152 lb 5.4 oz (69.1 kg) IBW/kg (Calculated) : 57   Vital Signs: Temp: 104.7 F (40.4 C) (08/06 1700) Temp Source: Axillary (08/06 1700) BP: 180/87 (08/06 1700) Pulse Rate: 97 (08/06 1719)  Labs: Recent Labs    04/18/18 1208 04/18/18 1419  HGB 13.5  --   HCT 42.2  --   PLT 200  --   LABPROT  --  13.6  INR  --  1.05  CREATININE 1.05*  --   CKTOTAL 161  --   TROPONINI 0.03*  --     Estimated Creatinine Clearance: 42.4 mL/min (A) (by C-G formula based on SCr of 1.05 mg/dL (H)).   Medical History: Past Medical History:  Diagnosis Date  . Diabetes mellitus without complication (HCC)   . Hypertension     Medications:  Scheduled:  . albuterol  2.5 mg Nebulization Q4H  . dexamethasone  10 mg Intravenous Q6H  . insulin aspart  2-6 Units Subcutaneous Q4H  . propofol  150 mg Intravenous Once  . rocuronium  80 mg Intravenous Once   Infusions:  . sodium chloride    . acetaminophen 1,000 mg (04/18/18 1716)  . acyclovir 680 mg (04/18/18 1624)  . acyclovir    . ampicillin (OMNIPEN) IV    . cefTRIAXone (ROCEPHIN)  IV    . famotidine (PEPCID) IV    . fentaNYL infusion INTRAVENOUS 100 mcg/hr (04/18/18 1221)  . heparin    . lactated ringers with kcl    . sodium chloride 1,000 mL (04/18/18 1635)  . vancomycin      Assessment: Pharmacy consulted for heparin drip management for 82 yo female admitted with meningitis and requiring mechanical ventilation. Patient previously on apixaban for left leg DVT in 02/2018.   Goal of Therapy:  Heparin level 0.3-0.7 units/ml aPTT 66-102 seconds Monitor platelets by anticoagulation protocol: Yes   Plan:  Will initiate heparin 1000 units/hr with no bolus. Will obtain aPTT at 0100 on 8/7. Will monitor heparin levels daily until heparin levels and aPTTs correlate.    8/7 AM heparin level 1.6, aPTT >160. Hold drip x 1 hour and restart at 800 units/hr. Recheck aPTT 6 hours after restart,  Pharmacy will continue to monitor and adjust per consult.   Simpson,Michael L 04/18/2018,5:20 PM

## 2018-04-18 NOTE — Progress Notes (Addendum)
Pharmacy Electrolyte Monitoring Consult:  Pharmacy consulted to assist in monitoring and replacing electrolytes in this 82 y.o. female admitted on 04/18/2018 with Altered Mental Status   Labs:  Sodium (mmol/L)  Date Value  04/18/2018 139  10/03/2014 142   Potassium (mmol/L)  Date Value  04/18/2018 3.3 (L)  10/03/2014 3.6   Magnesium (mg/dL)  Date Value  16/10/960408/02/2018 0.9 (LL)   Phosphorus (mg/dL)  Date Value  54/09/811908/02/2018 2.7   Calcium (mg/dL)  Date Value  14/78/295608/02/2018 9.7   Calcium, Total (mg/dL)  Date Value  21/30/865701/21/2016 9.3   Albumin (g/dL)  Date Value  84/69/629508/02/2018 4.0  10/01/2014 2.6 (L)    Assessment/Plan: Patient initiated on insulin drip for hyperglycemia. Patient ordered MIVF LR/Potassium 20mEq @ 7075mL/hr. Will order additional 30mEq of IV potassium to maintain potassium ~ 4.   Patient ordered magnesium 2g IV x 1. Will transition patient to magnesium 4g IV x1 and continue with scheduled recheck at 2200. Goal magnesium ~ 2.   Pharmacy will continue to monitor and adjust per consult.   Stephaie Dardis L 04/18/2018 7:36 PM

## 2018-04-18 NOTE — Code Documentation (Signed)
Pt arrives via EMS from home, pt was found unresponsive at home this AM at around 1000, family states they began CPR, upon EMS arrival pt had a pulse, upon arrival to ED temp 102.8 and unresponsive, pt intubated, per EDP eyes deviated to the left, code stroke activated, pt to CT for non con head CT and CTA, returned to room 19, NIHSS 33, report off to McDonald's CorporationChristina RN

## 2018-04-18 NOTE — ED Notes (Signed)
Intubation performed by Dr. Scotty CourtStafford. Patients' O2 sat remained at 100% through the entire intubation.

## 2018-04-18 NOTE — Progress Notes (Signed)
Placed bite block due to pt biting ETT

## 2018-04-18 NOTE — Consult Note (Signed)
Referring Physician: Scotty Knight    Chief Complaint: Benson SettingUnresponsive  HPI: Natalie Knight is an 82 y.o. female last seen by friends when visiting yesterday but they are not available to determine patient's state at that time.  Spoke with someone on the phone at 8p and patient was not at baseline.  Found today unresponsive.  Family start CPR and when EMS arrived patient with a pulse.  On arrival to ED intubated.    Date last known well: Unable to determine Time last known well: Unable to determine tPA Given: No: On Eliquis  Past Medical History:  Diagnosis Date  . Diabetes mellitus without complication (HCC)   . Hypertension     No past surgical history on file.  Family history: Unable to obtain due to mental status  Social History:  reports that she has never smoked. Her smokeless tobacco use includes snuff. She reports that she does not drink alcohol. Her drug history is not on file.  Allergies: No Known Allergies  Medications: I have reviewed the patient's current medications. Prior to Admission:  Prior to Admission medications   Medication Sig Start Date End Date Taking? Authorizing Provider  bisacodyl (DULCOLAX) 5 MG EC tablet Take 1 tablet by mouth daily as needed.   Yes [provider]  cetirizine (ZYRTEC) 10 MG tablet Take 1 tablet by mouth daily.   Yes [provider]  docusate sodium (COLACE) 100 MG capsule Take 100 mg by mouth 2 (two) times daily as needed for mild constipation.   Yes [provider]  ELIQUIS 2.5 MG TABS tablet Take 1 tablet by mouth 2 (two) times daily. 03/31/18  Yes [provider]  escitalopram (LEXAPRO) 20 MG tablet Take 1 tablet by mouth daily. 03/20/18  Yes [provider]  gabapentin (NEURONTIN) 100 MG capsule Take 1 capsule by mouth 3 (three) times daily. 03/31/18  Yes [provider]  losartan (COZAAR) 100 MG tablet Take 1 tablet by mouth daily. 04/06/18  Yes [provider]  magnesium oxide  (MAG-OX) 400 MG tablet Take 400 mg by mouth daily.   Yes [provider]  metFORMIN (GLUCOPHAGE) 500 MG tablet Take 500-1,000 mg by mouth 2 (two) times daily. 1000MG -AM,  500MG -PM 04/06/18  Yes [provider]  Multiple Vitamin (MULTI-VITAMINS) TABS Take 1 tablet by mouth daily.   Yes [provider]  omega-3 acid ethyl esters (LOVAZA) 1 g capsule Take 1 g by mouth 2 (two) times daily.   Yes [provider]  omeprazole (PRILOSEC) 20 MG capsule Take 1 capsule by mouth daily. 04/06/18  Yes [provider]  rOPINIRole (REQUIP) 1 MG tablet Take 1 tablet by mouth 2 (two) times daily. 02/16/18  Yes [provider]  insulin aspart (NOVOLOG FLEXPEN) 100 UNIT/ML FlexPen WAITING FOR PRIOR AUTHORIZATION HAS NOT STARTED YET    [provider]    ROS: Unable to provide  Physical Examination: Blood pressure (!) 243/104, pulse 100, temperature (!) 102.8 F (39.3 C), temperature source Rectal, resp. rate 20, SpO2 100 %.  HEENT-  Normocephalic, no lesions, without obvious abnormality.  Normal external eye and conjunctiva.  Normal TM's bilaterally.  Normal auditory canals and external ears. Normal external nose, mucus membranes and septum.  Normal pharynx. Cardiovascular- S1, S2 normal, pulses palpable throughout   Lungs- chest clear, no wheezing, rales, normal symmetric air entry Abdomen- soft, non-tender; bowel sounds normal; no masses,  no organomegaly Extremities- no edema Lymph-no adenopathy palpable Musculoskeletal-no joint tenderness, deformity or swelling Skin-warm and  dry, no hyperpigmentation, vitiligo, or suspicious lesions  Neurological Examination   Mental Status: Patient does not respond to verbal stimuli.  Does not respond to deep sternal rub.  Does not follow commands.  No verbalizations noted.  Cranial Nerves: II: patient does not respond confrontation bilaterally, pupils right 2 mm, left 2 mm,and unreactive  bilaterally III,IV,VI: doll's response absent bilaterally. Cold caloric responses absent bilaterally. V,VII: corneal reflex absent bilaterally  VIII: patient does not respond to verbal stimuli IX,X: gag reflex absent, XI: trapezius strength unable to test bilaterally XII: tongue strength unable to test Motor: Extremities flaccid throughout.  No spontaneous movement noted.  No purposeful movements noted. Sensory: Does not respond to noxious stimuli in any extremity. Deep Tendon Reflexes:  Absent throughout. Plantars: Mute bilaterally Cerebellar: Unable to perform   Laboratory Studies:  Basic Metabolic Panel: No results for input(s): NA, K, CL, CO2, GLUCOSE, BUN, CREATININE, CALCIUM, MG, PHOS in the last 168 hours.  Liver Function Tests: No results for input(s): AST, ALT, ALKPHOS, BILITOT, PROT, ALBUMIN in the last 168 hours. No results for input(s): LIPASE, AMYLASE in the last 168 hours. No results for input(s): AMMONIA in the last 168 hours.  CBC: Recent Labs  Lab 04/18/18 1208  WBC 16.3*  NEUTROABS 15.1*  HGB 13.5  HCT 42.2  MCV 77.8*  PLT 200    Cardiac Enzymes: No results for input(s): CKTOTAL, CKMB, CKMBINDEX, TROPONINI in the last 168 hours.  BNP: Invalid input(s): POCBNP  CBG: Recent Labs  Lab 04/18/18 1235  GLUCAP 235*    Microbiology: No results found for this or any previous visit.  Coagulation Studies: No results for input(s): LABPROT, INR in the last 72 hours.  Urinalysis:  Recent Labs  Lab 04/18/18 1208  COLORURINE STRAW*  LABSPEC 1.006  PHURINE 8.0  GLUCOSEU >=500*  HGBUR SMALL*  BILIRUBINUR NEGATIVE  KETONESUR 20*  PROTEINUR NEGATIVE  NITRITE NEGATIVE  LEUKOCYTESUR NEGATIVE    Lipid Panel:    Component Value Date/Time   CHOL 199 12/01/2011 2010   TRIG 211 (H) 12/01/2011 2010   HDL 68 (H) 12/01/2011 2010   VLDL 42 (H) 12/01/2011 2010   LDLCALC 89 12/01/2011 2010    HgbA1C:  Lab Results  Component Value Date   HGBA1C  7.0 (H) 06/25/2014    Urine Drug Screen:  No results found for: LABOPIA, COCAINSCRNUR, LABBENZ, AMPHETMU, THCU, LABBARB  Alcohol Level: No results for input(s): ETH in the last 168 hours.   Imaging: Ct Head Code Stroke Wo Contrast`  Result Date: 04/18/2018 CLINICAL DATA:  Code stroke. Found unresponsive at home today. Last seen normal last night. EXAM: CT HEAD WITHOUT CONTRAST TECHNIQUE: Contiguous axial images were obtained from the base of the skull through the vertex without intravenous contrast. COMPARISON:  02/01/2018 FINDINGS: Brain: There are extensive chronic small-vessel ischemic changes throughout the cerebral hemispheric white matter. No identifiable acute infarction, mass lesion, hemorrhage, hydrocephalus or extra-axial collection. Vascular: There is atherosclerotic calcification of the major vessels at the base of the brain. Skull: Negative Sinuses/Orbits: Clear/negative Other: None ASPECTS ([place] Stroke Program Early CT Score) - Ganglionic level infarction (caudate, lentiform nuclei, internal capsule, insula, M1-M3 cortex): 7 - Supraganglionic infarction (M4-M6 cortex): 3 Total score (0-10 with 10 being normal): 10 IMPRESSION: 1. No acute finding by CT. Extensive chronic small-vessel ischemic changes of the cerebral hemispheric white matter. 2. ASPECTS is 10. 3. These results were called by telephone at the time of interpretation on 04/18/2018 at 12:48 pm to Dr. Sharman Cheek ,  who verbally acknowledged these results. Electronically Signed   By: Paulina Fusi M.D.   On: 04/18/2018 12:50    Assessment: 82 y.o. female found unresponsive.  Patient febrile.  White blood cell count elevated.  Head CT reviewed and shows no acute changes.  CTA shows no evidence of large vessel occlusion.  Concern is for infection.  Patient unable to have LP due to being on Eliquis and unclear when last dosing was.    Stroke Risk Factors - diabetes mellitus and hypertension  Plan: 1. HgbA1c, fasting lipid  panel 2. MRI of the brain without contrast 3. Echocardiogram 4. Carotid dopplers 5. Prophylactic therapy-rectal ASA 6. NPO 7. Telemetry monitoring 8. Frequent neuro checks 9. EEG 10. Broad spectrum antibiotics with CNS coverage.  Would include Acyclovir as well.   11. Some question of seizure activity at presentation.  Agree with Keppra and seizure precautions  Case discussed with Dr. Claudius Sis, MD Neurology [phone] 04/18/2018, 1:22 PM

## 2018-04-18 NOTE — Consult Note (Signed)
PULMONARY / CRITICAL CARE MEDICINE   Name: Natalie Knight MRN: [id number] DOB: [birth year]    ADMISSION DATE:  04/18/2018 CONSULTATION DATE:  04/18/2018  REFERRING MD:  Dr. Jerelyn Charles  CHIEF COMPLAINT:  Unresponsive, Code Stroke  HISTORY OF PRESENT ILLNESS:   Natalie Knight is a [name] with a PMH of HTN, chronic anemia, dementia, gout, DVT on Eliquis, and DM II.  She presents to [facility] ED on 04/18/18 after being found unresponsive (nonverbal / no eye opening / not following commands) in her bed by family.  Family initiated CPR, upon EMS arrival pt noted to have a pulse, comatose, and responsive to pain.  Pt was subsequently intubated in ED. Last known well time was last night 04/17/18.  Initial evaluation in ED revealed fever of 102.8, tachycardia, hypertension, WBC 16,000, CT angio's of head and neck negative, CT Head negative, CXR negative.  In ED she was noted to have positive meningismus / nuchal rigidity, and decerebrate bilateral posturing to painful stimuli.  She is admitted to [facility] ICU for AMS requiring intubation, sepsis due to suspected acute bacterial meningitis, and questionable CVA.  PCCM is consulted for further management.  PAST MEDICAL HISTORY :  She  has a past medical history of Diabetes mellitus without complication ([place]) and Hypertension.  PAST SURGICAL HISTORY: She  has no past surgical history on file.  No Known Allergies  No current facility-administered medications on file prior to encounter.    Current Outpatient Medications on File Prior to Encounter  Medication Sig  . bisacodyl (DULCOLAX) 5 MG EC tablet Take 1 tablet by mouth daily as needed.  . cetirizine (ZYRTEC) 10 MG tablet Take 1 tablet by mouth daily.  Marland Kitchen docusate sodium (COLACE) 100 MG capsule Take 100 mg by mouth 2 (two) times daily as needed for mild constipation.  Marland Kitchen ELIQUIS 2.5 MG TABS tablet Take 1 tablet by mouth 2 (two) times daily.  Marland Kitchen escitalopram (LEXAPRO) 20 MG tablet Take 1 tablet by mouth daily.  Marland Kitchen  gabapentin (NEURONTIN) 100 MG capsule Take 1 capsule by mouth 3 (three) times daily.  Marland Kitchen losartan (COZAAR) 100 MG tablet Take 1 tablet by mouth daily.  . magnesium oxide (MAG-OX) 400 MG tablet Take 400 mg by mouth daily.  . metFORMIN (GLUCOPHAGE) 500 MG tablet Take 500-1,000 mg by mouth 2 (two) times daily. 1000MG-AM,  500MG-PM  . Multiple Vitamin (MULTI-VITAMINS) TABS Take 1 tablet by mouth daily.  Marland Kitchen omega-3 acid ethyl esters (LOVAZA) 1 g capsule Take 1 g by mouth 2 (two) times daily.  Marland Kitchen omeprazole (PRILOSEC) 20 MG capsule Take 1 capsule by mouth daily.  Marland Kitchen rOPINIRole (REQUIP) 1 MG tablet Take 1 tablet by mouth 2 (two) times daily.  . insulin aspart (NOVOLOG FLEXPEN) 100 UNIT/ML FlexPen WAITING FOR PRIOR AUTHORIZATION HAS NOT STARTED YET    FAMILY HISTORY:  Her family history is not on file.  SOCIAL HISTORY: She  reports that she has never smoked. Her smokeless tobacco use includes snuff. She reports that she does not drink alcohol.  REVIEW OF SYSTEMS:   Unable to assess due to intubation & comatose  SUBJECTIVE:  Intubated and unresponsive Febrile Hypertensive  VITAL SIGNS: BP (!) 195/98   Pulse 95   Temp (!) 102.8 F (39.3 C) (Rectal)   Resp (!) 23   SpO2 100%   HEMODYNAMICS:    VENTILATOR SETTINGS: Vent Mode: AC FiO2 (%):  [40 %] 40 % Set Rate:  [14 bmp] 14 bmp Vt Set:  [500 mL]  500 mL PEEP:  [5 cmH20] 5 cmH20  INTAKE / OUTPUT: No intake/output data recorded.  PHYSICAL EXAMINATION: General:  Acutely ill appearing female, intubated, laying in bed, in no acute distress Neuro:  Unresponsive to painful stimuli,on fentanyl gtt, gag / cough reflex present HEENT:  Atraumatic, normocephalic, No JVD, ETT in place Cardiovascular:  RRR, s1s2, no M/G/G, 2+ pulses throughout Lungs:  Rhonchi throughout, even, symmetrical, vent assisted,no assessory muscle use Abdomen:  Soft, non-tender, non-distended, BS + x4 Musculoskeletal:  No deformities Skin:  No obvious rashes, lesions,  or ulcerations  LABS:  BMET Recent Labs  Lab 04/18/18 1208  NA 139  K 3.3*  CL 98  CO2 25  BUN 15  CREATININE 1.05*  GLUCOSE 253*    Electrolytes Recent Labs  Lab 04/18/18 1208  CALCIUM 9.7    CBC Recent Labs  Lab 04/18/18 1208  WBC 16.3*  HGB 13.5  HCT 42.2  PLT 200    Coag's Recent Labs  Lab 04/18/18 1419  INR 1.05    Sepsis Markers Recent Labs  Lab 04/18/18 1208  PROCALCITON <0.10    ABG No results for input(s): PHART, PCO2ART, PO2ART in the last 168 hours.  Liver Enzymes Recent Labs  Lab 04/18/18 1208  AST 37  ALT 22  ALKPHOS 46  BILITOT 1.1  ALBUMIN 4.0    Cardiac Enzymes Recent Labs  Lab 04/18/18 1208  TROPONINI 0.03*    Glucose Recent Labs  Lab 04/18/18 1235  GLUCAP 235*    Imaging Ct Angio Head W Or Wo Contrast  Result Date: 04/18/2018 CLINICAL DATA:  Found unresponsive this morning. EXAM: CT ANGIOGRAPHY HEAD AND NECK TECHNIQUE: Multidetector CT imaging of the head and neck was performed using the standard protocol during bolus administration of intravenous contrast. Multiplanar CT image reconstructions and MIPs were obtained to evaluate the vascular anatomy. Carotid stenosis measurements (when applicable) are obtained utilizing NASCET criteria, using the distal internal carotid diameter as the denominator. CONTRAST:  77m ISOVUE-370 IOPAMIDOL (ISOVUE-370) INJECTION 76% COMPARISON:  Head CT earlier same day. FINDINGS: CTA NECK FINDINGS Aortic arch: Mild atherosclerotic calcification. No aneurysm or dissection. Branching pattern of the brachiocephalic vessels is normal without stenosis. Right carotid system: Common carotid artery widely patent to the bifurcation region. Calcified plaque at the carotid bifurcation and ICA bulb. Minimal diameter is 3.5 mm. Compared to a more distal cervical ICA diameter of 5 mm, this indicates a 30% stenosis. Left carotid system: Common carotid artery widely patent to the bifurcation. Mild  atherosclerotic calcification at the carotid bifurcation but no stenosis. Cervical ICA is widely patent. Vertebral arteries: Both vertebral artery origins are widely patent. Both vertebral arteries are widely patent through the cervical region to the foramen magnum. Skeleton: Ordinary cervical spondylosis. Other neck: No soft tissue lesion. Upper chest: Negative.  Patient is intubated. Review of the MIP images confirms the above findings CTA HEAD FINDINGS Anterior circulation: Both internal carotid arteries are widely patent through the skull base. There is atherosclerotic calcification in the carotid siphon regions but no stenosis. The anterior and middle cerebral vessels are patent without proximal stenosis, aneurysm or vascular malformation. No missing branch vessels are identified. Left A1 segment is congenitally small. Posterior circulation: Both vertebral arteries are patent at the foramen magnum level. There is atherosclerotic calcification in both V4 segments with stenosis estimated at about 30% on each side. No advanced stenosis. The basilar is widely patent. Posterior circulation branch vessels appear normal. Venous sinuses: Patent and normal. Anatomic variants: None significant.  Delayed phase: No abnormal enhancement. Review of the MIP images confirms the above findings IMPRESSION: No acute vascular finding.  No large or medium vessel occlusion. Atherosclerotic calcification at the carotid bifurcation and ICA bulb on the right. Maximal stenosis is measured at only 30% however. Minimal atherosclerosis at the left carotid bifurcation without stenosis. Mild atherosclerosis at the V4 segments of both vertebral arteries but without stenosis greater than 30%. Electronically Signed   By: Nelson Chimes M.D.   On: 04/18/2018 13:21   Ct Angio Neck W Or Wo Contrast  Result Date: 04/18/2018 CLINICAL DATA:  Found unresponsive this morning. EXAM: CT ANGIOGRAPHY HEAD AND NECK TECHNIQUE: Multidetector CT imaging of the  head and neck was performed using the standard protocol during bolus administration of intravenous contrast. Multiplanar CT image reconstructions and MIPs were obtained to evaluate the vascular anatomy. Carotid stenosis measurements (when applicable) are obtained utilizing NASCET criteria, using the distal internal carotid diameter as the denominator. CONTRAST:  81m ISOVUE-370 IOPAMIDOL (ISOVUE-370) INJECTION 76% COMPARISON:  Head CT earlier same day. FINDINGS: CTA NECK FINDINGS Aortic arch: Mild atherosclerotic calcification. No aneurysm or dissection. Branching pattern of the brachiocephalic vessels is normal without stenosis. Right carotid system: Common carotid artery widely patent to the bifurcation region. Calcified plaque at the carotid bifurcation and ICA bulb. Minimal diameter is 3.5 mm. Compared to a more distal cervical ICA diameter of 5 mm, this indicates a 30% stenosis. Left carotid system: Common carotid artery widely patent to the bifurcation. Mild atherosclerotic calcification at the carotid bifurcation but no stenosis. Cervical ICA is widely patent. Vertebral arteries: Both vertebral artery origins are widely patent. Both vertebral arteries are widely patent through the cervical region to the foramen magnum. Skeleton: Ordinary cervical spondylosis. Other neck: No soft tissue lesion. Upper chest: Negative.  Patient is intubated. Review of the MIP images confirms the above findings CTA HEAD FINDINGS Anterior circulation: Both internal carotid arteries are widely patent through the skull base. There is atherosclerotic calcification in the carotid siphon regions but no stenosis. The anterior and middle cerebral vessels are patent without proximal stenosis, aneurysm or vascular malformation. No missing branch vessels are identified. Left A1 segment is congenitally small. Posterior circulation: Both vertebral arteries are patent at the foramen magnum level. There is atherosclerotic calcification in both  V4 segments with stenosis estimated at about 30% on each side. No advanced stenosis. The basilar is widely patent. Posterior circulation branch vessels appear normal. Venous sinuses: Patent and normal. Anatomic variants: None significant. Delayed phase: No abnormal enhancement. Review of the MIP images confirms the above findings IMPRESSION: No acute vascular finding.  No large or medium vessel occlusion. Atherosclerotic calcification at the carotid bifurcation and ICA bulb on the right. Maximal stenosis is measured at only 30% however. Minimal atherosclerosis at the left carotid bifurcation without stenosis. Mild atherosclerosis at the V4 segments of both vertebral arteries but without stenosis greater than 30%. Electronically Signed   By: MNelson ChimesM.D.   On: 04/18/2018 13:21   Dg Chest Port 1 View  Result Date: 04/18/2018 CLINICAL DATA:  Intubated patient, sepsis, orogastric tube placed. EXAM: PORTABLE CHEST 1 VIEW COMPARISON:  Chest x-ray of June 06, 2014 FINDINGS: The lungs are borderline hypoinflated but clear. The endotracheal tube tip projects 3 cm above the carina. There is no pleural effusion or pneumothorax. The heart and pulmonary vascularity are normal. The esophagogastric tubes tip in proximal port project in the distal stomach. IMPRESSION: Intubated patient.  No acute pneumonia nor CHF. Reasonable  positioning of the endotracheal tube and esophagogastric tube. Electronically Signed   By: David  Martinique M.D.   On: 04/18/2018 13:56   Ct Head Code Stroke Wo Contrast`  Result Date: 04/18/2018 CLINICAL DATA:  Code stroke. Found unresponsive at home today. Last seen normal last night. EXAM: CT HEAD WITHOUT CONTRAST TECHNIQUE: Contiguous axial images were obtained from the base of the skull through the vertex without intravenous contrast. COMPARISON:  02/01/2018 FINDINGS: Brain: There are extensive chronic small-vessel ischemic changes throughout the cerebral hemispheric white matter. No  identifiable acute infarction, mass lesion, hemorrhage, hydrocephalus or extra-axial collection. Vascular: There is atherosclerotic calcification of the major vessels at the base of the brain. Skull: Negative Sinuses/Orbits: Clear/negative Other: None ASPECTS ([place] Stroke Program Early CT Score) - Ganglionic level infarction (caudate, lentiform nuclei, internal capsule, insula, M1-M3 cortex): 7 - Supraganglionic infarction (M4-M6 cortex): 3 Total score (0-10 with 10 being normal): 10 IMPRESSION: 1. No acute finding by CT. Extensive chronic small-vessel ischemic changes of the cerebral hemispheric white matter. 2. ASPECTS is 10. 3. These results were called by telephone at the time of interpretation on 04/18/2018 at 12:48 pm to Dr. Carrie Mew , who verbally acknowledged these results. Electronically Signed   By: Nelson Chimes M.D.   On: 04/18/2018 12:50     STUDIES:  CT Angio Head 04/18/18>> negative CT Angio Neck 04/18/18>> negative CT Head 04/18/18>> Negative CXR 04/18/18>> Negative  CULTURES: Blood x2 04/18/18>> Urine 04/18/18>>  ANTIBIOTICS: Ampicillin 8/6>> Vancomycin 8/6>> Acyclovir 8/6>> Rocephin 8/6>> Cefepime 8/6>> x1 dose  SIGNIFICANT EVENTS: 8/6>> Presented to [facility] ED after being found unresponsive  LINES/TUBES: ETT 8/6>>  DISCUSSION: [name] admitted with AMS, sepsis due to suspected acute bacterial meningitis, and ? CVA. Intubated for airway protection.  ASSESSMENT / PLAN:  PULMONARY A: Intubated for airway protection P:   PRVC: 8 cc/kg Wean FiO2 / Peep as tolerated to maintain O2 sats >92% SBT when parameters met VAP Bundle  Pulmonary hygiene Follow intermittent CXR & ABG  CARDIOVASCULAR A:  HTN Elevated troponin, likely demand ischemia P:  ICU monitoring Allow for permissive HTN in the event of ? Stroke Goal for SBP 160-180 given starting Heparin gtt for Hx of DVT IV Hydralazine to maintain SBP>180 Trend Troponin Check ECHO  RENAL A:    AKI Hypokalemia Hypomagnesemia P:   Monitor I&O's / urinary output Follow BMP Ensure adequate renal perfusion Avoid nephrotoxic agents as able Replace electrolytes as indicated IV K repletion: LR with 20 K @ 75 ml/hr IV Magnesium repletion, recheck serum Mag @ 2200  GASTROINTESTINAL A:   No active issues P:   NPO  Pepcid for SUP SLP eval once extubated  HEMATOLOGIC A:   Hx of DVT on Eliquis Hx Anemia P:  Monitor for s/sx of bleeding Trend CBC Heparin gtt for VTE prophylaxis / Hx of DVT (given need for LP) Will hold home Eliquis Transfuse for Hgb<7   INFECTIOUS A:   Leukocytosis Sepsis in setting of suspected acute bacterial meningitis Lactic Acidosis P:   Monitor fever curve Trend WBC's & PCT Follow cultures as above Abx as above LP when able  ENDOCRINE A:   DM II Hyperglycemia  P:   CBG q4h Insulin gtt Follow ICU Hypo/Hyperglycemia protocol Hold home metformin  NEUROLOGIC A:   AMS ? Acute bacterial Meningitis ? CVA -CT Head negative & CT angio's of head and neck negative Sedation needs in setting of mechanical ventilation P:   RASS goal: 0 to -1 Fentanyl gtt  to maintain RASS goal Avoid sedating meds as able  Neurology consulted, appreciate input Unable to perform LP at this time due to home Eliquis (now holding home Eliquis) Empiric Abx coverage for meningitis as above IV Decadron Check ammonia EEG pending Consider MRI   FAMILY  - Updates: No family present for update during NP rounds.  - Inter-disciplinary family meet or Palliative Care meeting due by:  04/25/18    Darel Hong, AGACNP-BC [facility] Pager: [phone]  04/18/2018, 3:38 PM

## 2018-04-18 NOTE — Progress Notes (Signed)
Chaplain responded to a code stroke. Pt is intubated and not responsive. No family present. Chaplain maintained space and silent prayer for Pt and care team. Pt taken to CT.    04/18/18 1200  Clinical Encounter Type  Visited With Patient  Visit Type Initial;Spiritual support;Code  Referral From Nurse

## 2018-04-18 NOTE — Progress Notes (Signed)
Family Meeting Note  Advance Directive:yes  Today a meeting took place with the Patient, patient's 2 daughters.  Patient is unable to participate due MW:[id number]:Lacked capacity Comatose   The following clinical team members were present during this meeting:MD  The following were discussed:Patient's diagnosis: Coma, suspected bacterial meningitis, Patient's progosis: Unable to determine and Goals for treatment: Full Code  Additional follow-up to be provided: prn  Time spent during discussion:20 minutes  Bertrum SolMontell D Salary, MD

## 2018-04-19 ENCOUNTER — Inpatient Hospital Stay (HOSPITAL_COMMUNITY)
Admit: 2018-04-19 | Discharge: 2018-04-19 | Disposition: A | Discharge status: Home / Self Care | Payer: Medicare HMO | Attending: Pulmonary Disease | Admitting: Pulmonary Disease

## 2018-04-19 ENCOUNTER — Inpatient Hospital Stay: Payer: Medicare HMO

## 2018-04-19 ENCOUNTER — Other Ambulatory Visit: Payer: Medicare HMO

## 2018-04-19 DIAGNOSIS — G039 Meningitis, unspecified: Secondary | ICD-10-CM

## 2018-04-19 DIAGNOSIS — I5030 Unspecified diastolic (congestive) heart failure: Secondary | ICD-10-CM

## 2018-04-19 DIAGNOSIS — J9601 Acute respiratory failure with hypoxia: Secondary | ICD-10-CM

## 2018-04-19 LAB — CBC
HEMATOCRIT: 37 % (ref 35.0–47.0)
HEMOGLOBIN: 11.8 g/dL — AB (ref 12.0–16.0)
MCH: 24.6 pg — AB (ref 26.0–34.0)
MCHC: 32 g/dL (ref 32.0–36.0)
MCV: 77.1 fL — AB (ref 80.0–100.0)
PLATELETS: 191 10*3/uL (ref 150–440)
RBC: 4.81 MIL/uL (ref 3.80–5.20)
RDW: 14.2 % (ref 11.5–14.5)
WBC: 19.5 10*3/uL — AB (ref 3.6–11.0)

## 2018-04-19 LAB — GLUCOSE, CAPILLARY
GLUCOSE-CAPILLARY: 131 mg/dL — AB (ref 70–99)
GLUCOSE-CAPILLARY: 137 mg/dL — AB (ref 70–99)
GLUCOSE-CAPILLARY: 177 mg/dL — AB (ref 70–99)
GLUCOSE-CAPILLARY: 183 mg/dL — AB (ref 70–99)
GLUCOSE-CAPILLARY: 222 mg/dL — AB (ref 70–99)
GLUCOSE-CAPILLARY: 441 mg/dL — AB (ref 70–99)
GLUCOSE-CAPILLARY: 95 mg/dL (ref 70–99)
Glucose-Capillary: 111 mg/dL — ABNORMAL HIGH (ref 70–99)
Glucose-Capillary: 152 mg/dL — ABNORMAL HIGH (ref 70–99)
Glucose-Capillary: 161 mg/dL — ABNORMAL HIGH (ref 70–99)
Glucose-Capillary: 163 mg/dL — ABNORMAL HIGH (ref 70–99)
Glucose-Capillary: 184 mg/dL — ABNORMAL HIGH (ref 70–99)
Glucose-Capillary: 184 mg/dL — ABNORMAL HIGH (ref 70–99)
Glucose-Capillary: 202 mg/dL — ABNORMAL HIGH (ref 70–99)
Glucose-Capillary: 214 mg/dL — ABNORMAL HIGH (ref 70–99)
Glucose-Capillary: 288 mg/dL — ABNORMAL HIGH (ref 70–99)
Glucose-Capillary: 314 mg/dL — ABNORMAL HIGH (ref 70–99)
Glucose-Capillary: 98 mg/dL (ref 70–99)

## 2018-04-19 LAB — TROPONIN I
TROPONIN I: 0.21 ng/mL — AB (ref <0.03)
Troponin I: 0.28 ng/mL (ref <0.03)

## 2018-04-19 LAB — BASIC METABOLIC PANEL
Anion gap: 14 (ref 5–15)
BUN: 20 mg/dL (ref 8–23)
CHLORIDE: 103 mmol/L (ref 98–111)
CO2: 22 mmol/L (ref 22–32)
Calcium: 8.4 mg/dL — ABNORMAL LOW (ref 8.9–10.3)
Creatinine, Ser: 1.42 mg/dL — ABNORMAL HIGH (ref 0.44–1.00)
GFR calc Af Amer: 40 mL/min — ABNORMAL LOW (ref >60)
GFR, EST NON AFRICAN AMERICAN: 34 mL/min — AB (ref >60)
GLUCOSE: 244 mg/dL — AB (ref 70–99)
POTASSIUM: 3.5 mmol/L (ref 3.5–5.1)
Sodium: 139 mmol/L (ref 135–145)

## 2018-04-19 LAB — URINE CULTURE: CULTURE: NO GROWTH

## 2018-04-19 LAB — ECHOCARDIOGRAM COMPLETE
Height: 65 in
WEIGHTICAEL: 2606.72 [oz_av]

## 2018-04-19 LAB — PROCALCITONIN: Procalcitonin: 0.47 ng/mL

## 2018-04-19 LAB — HEPARIN LEVEL (UNFRACTIONATED): Heparin Unfractionated: 1.6 IU/mL — ABNORMAL HIGH (ref 0.30–0.70)

## 2018-04-19 LAB — APTT

## 2018-04-19 LAB — RPR: RPR Ser Ql: NONREACTIVE

## 2018-04-19 LAB — MAGNESIUM: Magnesium: 2.5 mg/dL — ABNORMAL HIGH (ref 1.7–2.4)

## 2018-04-19 MED ORDER — INSULIN ASPART 100 UNIT/ML ~~LOC~~ SOLN: 2.0000 [IU] | SUBCUTANEOUS | Status: DC

## 2018-04-19 MED ORDER — PRO-STAT SUGAR FREE PO LIQD
30.0000 mL | Freq: Every day | ORAL | Status: DC
  Administered 2018-04-19 – 2018-04-22 (×4): 30 mL

## 2018-04-19 MED ORDER — INSULIN ASPART 100 UNIT/ML ~~LOC~~ SOLN
5.0000 [IU] | SUBCUTANEOUS | Status: DC
  Administered 2018-04-19: 5 [IU] via SUBCUTANEOUS

## 2018-04-19 MED ORDER — DEXTROSE 10 % IV SOLN: INTRAVENOUS | Status: DC | PRN

## 2018-04-19 MED ORDER — VITAL 1.5 CAL PO LIQD
1000.0000 mL | ORAL | Status: DC
  Administered 2018-04-19 – 2018-04-22 (×4): 1000 mL

## 2018-04-19 MED ORDER — SENNOSIDES-DOCUSATE SODIUM 8.6-50 MG PO TABS
1.0000 | ORAL_TABLET | Freq: Two times a day (BID) | ORAL | Status: DC
  Administered 2018-04-19 – 2018-05-06 (×22): 1
  Filled 2018-04-19 (×22): qty 1

## 2018-04-19 MED ORDER — HEPARIN (PORCINE) IN NACL 100-0.45 UNIT/ML-% IJ SOLN: 1000.0000 [IU]/h | INTRAMUSCULAR | Status: DC

## 2018-04-19 MED ORDER — INSULIN ASPART 100 UNIT/ML ~~LOC~~ SOLN
0.0000 [IU] | SUBCUTANEOUS | Status: DC
  Administered 2018-04-19: 20 [IU] via SUBCUTANEOUS

## 2018-04-19 MED ORDER — INSULIN DETEMIR 100 UNIT/ML ~~LOC~~ SOLN
7.0000 [IU] | Freq: Two times a day (BID) | SUBCUTANEOUS | Status: DC
  Administered 2018-04-19: 7 [IU] via SUBCUTANEOUS
  Filled 2018-04-19 (×2): qty 0.07

## 2018-04-19 MED ORDER — INSULIN DETEMIR 100 UNIT/ML ~~LOC~~ SOLN
5.0000 [IU] | Freq: Two times a day (BID) | SUBCUTANEOUS | Status: DC
  Filled 2018-04-19 (×2): qty 0.05

## 2018-04-19 MED ORDER — HEPARIN (PORCINE) IN NACL 100-0.45 UNIT/ML-% IJ SOLN
800.0000 [IU]/h | INTRAMUSCULAR | Status: DC
  Administered 2018-04-19: 800 [IU]/h via INTRAVENOUS
  Filled 2018-04-19: qty 250

## 2018-04-19 MED ORDER — NOREPINEPHRINE 16 MG/250ML-% IV SOLN
0.0000 ug/min | INTRAVENOUS | Status: DC
Start: 2018-04-19 — End: 2018-04-19

## 2018-04-19 MED ORDER — INSULIN ASPART 100 UNIT/ML ~~LOC~~ SOLN
SUBCUTANEOUS | Status: AC
  Administered 2018-04-19: 20 [IU] via SUBCUTANEOUS
  Filled 2018-04-19: qty 1

## 2018-04-19 MED ORDER — POTASSIUM CHLORIDE 10 MEQ/50ML IV SOLN
10.0000 meq | INTRAVENOUS | Status: AC
  Administered 2018-04-19 (×4): 10 meq via INTRAVENOUS
  Filled 2018-04-19 (×4): qty 50

## 2018-04-19 MED ORDER — INSULIN ASPART 100 UNIT/ML ~~LOC~~ SOLN
1.0000 [IU] | SUBCUTANEOUS | Status: DC
  Administered 2018-04-19: 1 [IU] via SUBCUTANEOUS

## 2018-04-19 MED ORDER — DEXTROSE 5 % IV SOLN
0.0000 ug/min | INTRAVENOUS | Status: DC
  Administered 2018-04-19: 2 ug/min via INTRAVENOUS
  Administered 2018-04-20: 12 ug/min via INTRAVENOUS
  Filled 2018-04-19: qty 16

## 2018-04-19 MED ORDER — HEPARIN (PORCINE) IN NACL 100-0.45 UNIT/ML-% IJ SOLN: 700.0000 [IU]/h | INTRAMUSCULAR | Status: DC

## 2018-04-19 MED ORDER — VITAL HIGH PROTEIN PO LIQD: 1000.0000 mL | ORAL | Status: DC

## 2018-04-19 MED ORDER — INSULIN ASPART 100 UNIT/ML ~~LOC~~ SOLN
0.0000 [IU] | SUBCUTANEOUS | Status: DC
  Administered 2018-04-19: 11 [IU] via SUBCUTANEOUS
  Filled 2018-04-19: qty 1

## 2018-04-19 MED ORDER — HEPARIN (PORCINE) IN NACL 100-0.45 UNIT/ML-% IJ SOLN
650.0000 [IU]/h | INTRAMUSCULAR | Status: DC
  Administered 2018-04-21: 600 [IU]/h via INTRAVENOUS
  Administered 2018-04-22: 850 [IU]/h via INTRAVENOUS
  Administered 2018-04-24: 650 [IU]/h via INTRAVENOUS
  Filled 2018-04-19 (×3): qty 250

## 2018-04-19 MED ORDER — POTASSIUM CHLORIDE 10 MEQ/100ML IV SOLN
10.0000 meq | INTRAVENOUS | Status: AC
  Administered 2018-04-19 (×2): 10 meq via INTRAVENOUS
  Filled 2018-04-19 (×2): qty 100

## 2018-04-19 MED ORDER — INSULIN DETEMIR 100 UNIT/ML ~~LOC~~ SOLN
10.0000 [IU] | Freq: Two times a day (BID) | SUBCUTANEOUS | Status: DC
  Filled 2018-04-19: qty 0.1

## 2018-04-19 NOTE — Progress Notes (Signed)
[place] for heparin drip management  Indication: DVT  No Known Allergies  Patient Measurements: Height: '5\' 5"'  (165.1 cm) Weight: 162 lb 14.7 oz (73.9 kg) IBW/kg (Calculated) : 57   Vital Signs: Temp: 98.5 F (36.9 C) (08/07 1600) Temp Source: Axillary (08/07 1600) BP: 129/60 (08/07 1800) Pulse Rate: 82 (08/07 1800)  Labs: Recent Labs    04/18/18 1208 04/18/18 1419 04/18/18 1755 04/18/18 2044 04/18/18 2314 04/19/18 0222 04/19/18 0349 04/19/18 0406 04/19/18 0600 04/19/18 1556  HGB 13.5  --   --   --   --   --   --  11.8*  --   --   HCT 42.2  --   --   --   --   --   --  37.0  --   --   PLT 200  --   --   --   --   --   --  191  --   --   APTT  --   --  27  --   --   --   --   --  >160* >160*  LABPROT  --  13.6  --   --   --   --   --   --   --   --   INR  --  1.05  --   --   --   --   --   --   --   --   HEPARINUNFRC  --   --   --   --   --  1.60*  --   --   --   --   CREATININE 1.05*  --   --   --  1.30*  --   --  1.42*  --   --   CKTOTAL 161  --   --   --   --   --   --   --   --   --   TROPONINI 0.03*  --  0.06* 0.06*  --   --  0.28*  --  0.21*  --     Estimated Creatinine Clearance: 32.4 mL/min (A) (by C-G formula based on SCr of 1.42 mg/dL (H)).   Medical History: Past Medical History:  Diagnosis Date  . Diabetes mellitus without complication ([place])   . Hypertension     Medications:  Scheduled:  . albuterol  2.5 mg Nebulization Q4H  . chlorhexidine gluconate (MEDLINE KIT)  15 mL Mouth Rinse BID  . dexamethasone  10 mg Intravenous Q6H  . feeding supplement (PRO-STAT SUGAR FREE 64)  30 mL Per Tube Daily  . insulin aspart  1 Units Subcutaneous Q4H  . insulin aspart  2-6 Units Subcutaneous Q4H  . insulin detemir  5 Units Subcutaneous Q12H  . mouth rinse  15 mL Mouth Rinse 10 times per day   Infusions:  . sodium chloride    . acyclovir Stopped (04/19/18 1045)  . ampicillin (OMNIPEN) IV Stopped (04/19/18 1740)  .  cefTRIAXone (ROCEPHIN)  IV Stopped (04/19/18 1750)  . dextrose    . famotidine (PEPCID) IV Stopped (04/19/18 1035)  . feeding supplement (VITAL 1.5 CAL) 50 mL/hr at 04/19/18 1800  . fentaNYL infusion INTRAVENOUS Stopped (04/19/18 0945)  . heparin 650 Units/hr (04/19/18 1815)  . lactated ringers with kcl 75 mL/hr at 04/19/18 1800  . norepinephrine (LEVOPHED) 25m / 2552minfusion 12.053 mcg/min (04/19/18 1800)  . vancomycin Stopped (04/18/18 2126)  Assessment: Pharmacy consulted for heparin drip management for 82 yo female admitted with meningitis and requiring mechanical ventilation. Patient previously on apixaban for left leg DVT in 02/2018. Heparin currently infusing at 813m/hr. Patient had heparin level held heparin held x 1 hour and rated reduced this am.   Goal of Therapy:  Heparin level 0.3-0.7 units/ml aPTT 66-102 seconds Monitor platelets by anticoagulation protocol: Yes   Plan:  Hold heparin x 1 hour and decrease rate to heparin 650 units/hr. Heparin scheduled to resume at 1815. Will obtain aPTT at 0300 on 8/7. Will monitor heparin levels daily until heparin levels and aPTTs correlate.   Pharmacy will continue to monitor and adjust per consult.   Simpson,Michael L 04/19/2018,7:12 PM

## 2018-04-19 NOTE — Procedures (Signed)
ELECTROENCEPHALOGRAM REPORT   Patient: Natalie Knight       Room #: IC16C EEG No. ID: [id number] Age: 82 y.o.        Sex: female Referring Physician: Kasa Report Date:  04/19/2018        Interpreting Physician: Thana FarrEYNOLDS, Rosie Golson  History: Natalie Natalie Knight is an 82 y.o. female found down.  Now intubated and sedated  Medications:  Zovirax, Omnipen, Rocephin, Decadron, Pepcid, Vancomycin, Fentanyl, Heparin, Insulin, Levophed  Conditions of Recording:  This is a 21 channel routine scalp EEG performed with bipolar and monopolar montages arranged in accordance to the international 10/20 system of electrode placement. One channel was dedicated to EKG recording.  The patient is in the intubated and sedated state.  Description:  The background activity is discontinuous.  It consists of markedly attenuated slow, delta activity in periods lasing up to 3 seconds alternating with generalized, higher voltage periods of intermixed delta and theta activity lasting up to 2 seconds.  This activity is persistent throughout the recording.  The patient is stimulated during the recording with no activation of the background rhythm noted. Hyperventilation and intermittent photic stimulation were not performed.  IMPRESSION: This is an abnormal EEG due to a discontinuous slow background.  No epileptiform activity is noted.  This finding is consistent with the patient's current medications or can be seen in instances of diffuse brain injury.  Clinical correlation recommended.     Thana FarrLeslie Giancarlos Berendt, MD Neurology [phone] 04/19/2018, 2:32 PM

## 2018-04-19 NOTE — Progress Notes (Signed)
Subjective: Patient remains intubated.  No sedation.  Remains unresponsive.    Objective: Current vital signs: BP (!) 147/64 (BP Location: Left Arm)   Pulse 97   Temp (!) 101 F (38.3 C) (Bladder)   Resp 15   Ht '5\' 5"'  (1.651 m)   Wt 73.9 kg (162 lb 14.7 oz)   SpO2 98%   BMI 27.11 kg/m  Vital signs in last 24 hours: Temp:  [101 F (38.3 C)-104.7 F (40.4 C)] 101 F (38.3 C) (08/07 1200) Pulse Rate:  [78-98] 97 (08/07 1200) Resp:  [9-26] 15 (08/07 1200) BP: (89-195)/(49-112) 147/64 (08/07 1200) SpO2:  [93 %-100 %] 98 % (08/07 1200) FiO2 (%):  [28 %] 28 % (08/07 1200) Weight:  [69.1 kg (152 lb 5.4 oz)-73.9 kg (162 lb 14.7 oz)] 73.9 kg (162 lb 14.7 oz) (08/07 0408)  Intake/Output from previous day: 08/06 0701 - 08/07 0700 In: 3378.3 [I.V.:1064.4; IV Piggyback:2313.9] Out: 1200 [Urine:1200] Intake/Output this shift: Total I/O In: 778.3 [I.V.:492.5; NG/GT:75; IV Piggyback:210.8] Out: 81 [Urine:58] Nutritional status:  Diet Order           Diet NPO time specified  Diet effective now          Neurologic Exam: Mental Status: Patient does not respond to verbal stimuli.  Does not respond to deep sternal rub.  Does not follow commands.  No verbalizations noted.  Cranial Nerves: II: patient does not respond confrontation bilaterally, pupils right 3 mm, left 3 mm,and reactive bilaterally III,IV,VI: doll's response present bilaterally. V,VII: corneal reflex present bilaterally  VIII: patient does not respond to verbal stimuli IX,X: gag reflex reduced, XI: trapezius strength unable to test bilaterally XII: tongue strength unable to test Motor: Extremities flaccid throughout.  No spontaneous movement noted.  No purposeful movements noted. Sensory: Does not respond to noxious stimuli in any extremity. Deep Tendon Reflexes:  Absent throughout. Plantars: upgoing on the right Cerebellar: Unable to perform    Lab Results: Basic Metabolic Panel: Recent Labs  Lab  04/18/18 1208 04/18/18 1527 04/18/18 2314 04/19/18 0406 04/19/18 0600  NA 139  --  139 139  --   K 3.3*  --  3.3* 3.5  --   CL 98  --  104 103  --   CO2 25  --  25 22  --   GLUCOSE 253*  --  153* 244*  --   BUN 15  --  17 20  --   CREATININE 1.05*  --  1.30* 1.42*  --   CALCIUM 9.7  --  8.3* 8.4*  --   MG  --  0.9* 2.6*  --  2.5*  PHOS  --  2.7  --   --   --     Liver Function Tests: Recent Labs  Lab 04/18/18 1208  AST 37  ALT 22  ALKPHOS 46  BILITOT 1.1  PROT 7.3  ALBUMIN 4.0   Recent Labs  Lab 04/18/18 1208  LIPASE 17   Recent Labs  Lab 04/18/18 1755  AMMONIA 12    CBC: Recent Labs  Lab 04/18/18 1208 04/19/18 0406  WBC 16.3* 19.5*  NEUTROABS 15.1*  --   HGB 13.5 11.8*  HCT 42.2 37.0  MCV 77.8* 77.1*  PLT 200 191    Cardiac Enzymes: Recent Labs  Lab 04/18/18 1208 04/18/18 1755 04/18/18 2044 04/19/18 0349 04/19/18 0600  CKTOTAL 161  --   --   --   --   TROPONINI 0.03* 0.06* 0.06* 0.28* 0.21*  Lipid Panel: No results for input(s): CHOL, TRIG, HDL, CHOLHDL, VLDL, LDLCALC in the last 168 hours.  CBG: Recent Labs  Lab 04/19/18 0911 04/19/18 1006 04/19/18 1103 04/19/18 1204 04/19/18 1347  GLUCAP 131* 183* 214* 184* 137*    Microbiology: Results for orders placed or performed during the hospital encounter of 04/18/18  Urine culture     Status: None   Collection Time: 04/18/18 12:08 PM  Result Value Ref Range Status   Specimen Description   Final    URINE, RANDOM Performed at [facility], [address], [place], [place] [address]    Special Requests   Final    NONE Performed at [facility], [address], [place], [place] [address]    Culture   Final    NO GROWTH Performed at [facility], [place] [address] [place], [place] [address]    Report Status 04/19/2018 FINAL  Final  Blood Culture (routine x 2)     Status: None (Preliminary result)   Collection Time: 04/18/18  1:12 PM  Result Value Ref  Range Status   Specimen Description BLOOD RIGHT HAND  Final   Special Requests   Final    BOTTLES DRAWN AEROBIC AND ANAEROBIC Blood Culture results may not be optimal due to an inadequate volume of blood received in culture bottles   Culture   Final    NO GROWTH < 24 HOURS Performed at [facility], [address], [place], [place] [address]    Report Status PENDING  Incomplete  Blood Culture (routine x 2)     Status: None (Preliminary result)   Collection Time: 04/18/18  1:20 PM  Result Value Ref Range Status   Specimen Description BLOOD RIGHT AC  Final   Special Requests   Final    BOTTLES DRAWN AEROBIC AND ANAEROBIC Blood Culture adequate volume   Culture   Final    NO GROWTH < 24 HOURS Performed at [facility], [address], [place], [place] [address]    Report Status PENDING  Incomplete  MRSA PCR Screening     Status: None   Collection Time: 04/18/18  5:19 PM  Result Value Ref Range Status   MRSA by PCR NEGATIVE NEGATIVE Final    Comment:        The GeneXpert MRSA Assay (FDA approved for NASAL specimens only), is one component of a comprehensive MRSA colonization surveillance program. It is not intended to diagnose MRSA infection nor to guide or monitor treatment for MRSA infections. Performed at [facility], [address] [place], [place] [address]     Coagulation Studies: Recent Labs    04/18/18 1419  LABPROT 13.6  INR 1.05    Imaging: Ct Angio Head W Or Wo Contrast  Result Date: 04/18/2018 CLINICAL DATA:  Found unresponsive this morning. EXAM: CT ANGIOGRAPHY HEAD AND NECK TECHNIQUE: Multidetector CT imaging of the head and neck was performed using the standard protocol during bolus administration of intravenous contrast. Multiplanar CT image reconstructions and MIPs were obtained to evaluate the vascular anatomy. Carotid stenosis measurements (when applicable) are obtained utilizing NASCET criteria, using the distal internal  carotid diameter as the denominator. CONTRAST:  46m ISOVUE-370 IOPAMIDOL (ISOVUE-370) INJECTION 76% COMPARISON:  Head CT earlier same day. FINDINGS: CTA NECK FINDINGS Aortic arch: Mild atherosclerotic calcification. No aneurysm or dissection. Branching pattern of the brachiocephalic vessels is normal without stenosis. Right carotid system: Common carotid artery widely patent to the bifurcation region. Calcified plaque  at the carotid bifurcation and ICA bulb. Minimal diameter is 3.5 mm. Compared to a more distal cervical ICA diameter of 5 mm, this indicates a 30% stenosis. Left carotid system: Common carotid artery widely patent to the bifurcation. Mild atherosclerotic calcification at the carotid bifurcation but no stenosis. Cervical ICA is widely patent. Vertebral arteries: Both vertebral artery origins are widely patent. Both vertebral arteries are widely patent through the cervical region to the foramen magnum. Skeleton: Ordinary cervical spondylosis. Other neck: No soft tissue lesion. Upper chest: Negative.  Patient is intubated. Review of the MIP images confirms the above findings CTA HEAD FINDINGS Anterior circulation: Both internal carotid arteries are widely patent through the skull base. There is atherosclerotic calcification in the carotid siphon regions but no stenosis. The anterior and middle cerebral vessels are patent without proximal stenosis, aneurysm or vascular malformation. No missing branch vessels are identified. Left A1 segment is congenitally small. Posterior circulation: Both vertebral arteries are patent at the foramen magnum level. There is atherosclerotic calcification in both V4 segments with stenosis estimated at about 30% on each side. No advanced stenosis. The basilar is widely patent. Posterior circulation branch vessels appear normal. Venous sinuses: Patent and normal. Anatomic variants: None significant. Delayed phase: No abnormal enhancement. Review of the MIP images confirms the  above findings IMPRESSION: No acute vascular finding.  No large or medium vessel occlusion. Atherosclerotic calcification at the carotid bifurcation and ICA bulb on the right. Maximal stenosis is measured at only 30% however. Minimal atherosclerosis at the left carotid bifurcation without stenosis. Mild atherosclerosis at the V4 segments of both vertebral arteries but without stenosis greater than 30%. Electronically Signed   By: Nelson Chimes M.D.   On: 04/18/2018 13:21   Ct Angio Neck W Or Wo Contrast  Result Date: 04/18/2018 CLINICAL DATA:  Found unresponsive this morning. EXAM: CT ANGIOGRAPHY HEAD AND NECK TECHNIQUE: Multidetector CT imaging of the head and neck was performed using the standard protocol during bolus administration of intravenous contrast. Multiplanar CT image reconstructions and MIPs were obtained to evaluate the vascular anatomy. Carotid stenosis measurements (when applicable) are obtained utilizing NASCET criteria, using the distal internal carotid diameter as the denominator. CONTRAST:  35m ISOVUE-370 IOPAMIDOL (ISOVUE-370) INJECTION 76% COMPARISON:  Head CT earlier same day. FINDINGS: CTA NECK FINDINGS Aortic arch: Mild atherosclerotic calcification. No aneurysm or dissection. Branching pattern of the brachiocephalic vessels is normal without stenosis. Right carotid system: Common carotid artery widely patent to the bifurcation region. Calcified plaque at the carotid bifurcation and ICA bulb. Minimal diameter is 3.5 mm. Compared to a more distal cervical ICA diameter of 5 mm, this indicates a 30% stenosis. Left carotid system: Common carotid artery widely patent to the bifurcation. Mild atherosclerotic calcification at the carotid bifurcation but no stenosis. Cervical ICA is widely patent. Vertebral arteries: Both vertebral artery origins are widely patent. Both vertebral arteries are widely patent through the cervical region to the foramen magnum. Skeleton: Ordinary cervical spondylosis.  Other neck: No soft tissue lesion. Upper chest: Negative.  Patient is intubated. Review of the MIP images confirms the above findings CTA HEAD FINDINGS Anterior circulation: Both internal carotid arteries are widely patent through the skull base. There is atherosclerotic calcification in the carotid siphon regions but no stenosis. The anterior and middle cerebral vessels are patent without proximal stenosis, aneurysm or vascular malformation. No missing branch vessels are identified. Left A1 segment is congenitally small. Posterior circulation: Both vertebral arteries are patent at the foramen magnum level. There is  atherosclerotic calcification in both V4 segments with stenosis estimated at about 30% on each side. No advanced stenosis. The basilar is widely patent. Posterior circulation branch vessels appear normal. Venous sinuses: Patent and normal. Anatomic variants: None significant. Delayed phase: No abnormal enhancement. Review of the MIP images confirms the above findings IMPRESSION: No acute vascular finding.  No large or medium vessel occlusion. Atherosclerotic calcification at the carotid bifurcation and ICA bulb on the right. Maximal stenosis is measured at only 30% however. Minimal atherosclerosis at the left carotid bifurcation without stenosis. Mild atherosclerosis at the V4 segments of both vertebral arteries but without stenosis greater than 30%. Electronically Signed   By: Nelson Chimes M.D.   On: 04/18/2018 13:21   Dg Chest Port 1 View  Result Date: 04/19/2018 CLINICAL DATA:  Respiratory failure EXAM: PORTABLE CHEST 1 VIEW COMPARISON:  04/18/2018 FINDINGS: Endotracheal tube tip is 2.8 cm above the inferior margin of the carina, unchanged. Enteric tube tip is beyond the field of view. Right IJ central venous catheter tip is at the lower SVC. Lungs are clear.  Normal cardiomediastinal contours. IMPRESSION: Unchanged support apparatus. Electronically Signed   By: Ulyses Jarred M.D.   On: 04/19/2018  05:24   Dg Chest Port 1 View  Result Date: 04/18/2018 CLINICAL DATA:  82 year old female status post central line placement. EXAM: PORTABLE CHEST 1 VIEW COMPARISON:  Chest x-ray 04/18/2018. FINDINGS: An endotracheal tube is in place with tip 2.9 cm above the carina. A nasogastric tube is seen extending into the stomach, however, the tip of the nasogastric tube extends below the lower margin of the image. There is a right-sided internal jugular central venous catheter with tip terminating in the superior cavoatrial junction. Lung volumes are low. No consolidative airspace disease. No pleural effusions. No pneumothorax. No pulmonary nodule or mass noted. Pulmonary vasculature and the cardiomediastinal silhouette are within normal limits. IMPRESSION: 1. Support apparatus, as above. 2. No pneumothorax following right IJ central line placement. Low lung volumes without radiographic evidence of acute cardiopulmonary disease. Electronically Signed   By: Vinnie Langton M.D.   On: 04/18/2018 23:48   Dg Chest Port 1 View  Result Date: 04/18/2018 CLINICAL DATA:  Intubated patient, sepsis, orogastric tube placed. EXAM: PORTABLE CHEST 1 VIEW COMPARISON:  Chest x-ray of June 06, 2014 FINDINGS: The lungs are borderline hypoinflated but clear. The endotracheal tube tip projects 3 cm above the carina. There is no pleural effusion or pneumothorax. The heart and pulmonary vascularity are normal. The esophagogastric tubes tip in proximal port project in the distal stomach. IMPRESSION: Intubated patient.  No acute pneumonia nor CHF. Reasonable positioning of the endotracheal tube and esophagogastric tube. Electronically Signed   By: David  Martinique M.D.   On: 04/18/2018 13:56   Ct Head Code Stroke Wo Contrast`  Result Date: 04/18/2018 CLINICAL DATA:  Code stroke. Found unresponsive at home today. Last seen normal last night. EXAM: CT HEAD WITHOUT CONTRAST TECHNIQUE: Contiguous axial images were obtained from the base of  the skull through the vertex without intravenous contrast. COMPARISON:  02/01/2018 FINDINGS: Brain: There are extensive chronic small-vessel ischemic changes throughout the cerebral hemispheric white matter. No identifiable acute infarction, mass lesion, hemorrhage, hydrocephalus or extra-axial collection. Vascular: There is atherosclerotic calcification of the major vessels at the base of the brain. Skull: Negative Sinuses/Orbits: Clear/negative Other: None ASPECTS ([place] Stroke Program Early CT Score) - Ganglionic level infarction (caudate, lentiform nuclei, internal capsule, insula, M1-M3 cortex): 7 - Supraganglionic infarction (M4-M6 cortex): 3 Total score (  0-10 with 10 being normal): 10 IMPRESSION: 1. No acute finding by CT. Extensive chronic small-vessel ischemic changes of the cerebral hemispheric white matter. 2. ASPECTS is 10. 3. These results were called by telephone at the time of interpretation on 04/18/2018 at 12:48 pm to Dr. Carrie Mew , who verbally acknowledged these results. Electronically Signed   By: Nelson Chimes M.D.   On: 04/18/2018 12:50    Medications:  I have reviewed the patient's current medications. Scheduled: . albuterol  2.5 mg Nebulization Q4H  . chlorhexidine gluconate (MEDLINE KIT)  15 mL Mouth Rinse BID  . dexamethasone  10 mg Intravenous Q6H  . feeding supplement (PRO-STAT SUGAR FREE 64)  30 mL Per Tube Daily  . mouth rinse  15 mL Mouth Rinse 10 times per day   Continuous: . sodium chloride    . acyclovir Stopped (04/19/18 1045)  . ampicillin (OMNIPEN) IV Stopped (04/19/18 1246)  . cefTRIAXone (ROCEPHIN)  IV Stopped (04/19/18 0525)  . famotidine (PEPCID) IV Stopped (04/19/18 1035)  . feeding supplement (VITAL 1.5 CAL) 1,000 mL (04/19/18 1219)  . fentaNYL infusion INTRAVENOUS Stopped (04/19/18 0945)  . heparin 800 Units/hr (04/19/18 1200)  . insulin (NOVOLIN-R) infusion 3.7 Units/hr (04/19/18 1348)  . lactated ringers with kcl 75 mL/hr at 04/19/18 1200  .  norepinephrine (LEVOPHED) 23m / 254minfusion 12.053 mcg/min (04/19/18 1200)  . vancomycin Stopped (04/18/18 2126)    Assessment/Plan: Patient remains intubated.  Unresponsive.  On no sedation.  Remains febrile.   Recommendations: 1. Continue broad spectrum antibiotics, decadron and acyclovir for CNS coverage 2. EEG 3. MRI of the brain 4. If above unremarkable and patient continues to be febrile would consider LP.     LOS: 1 day   LeAlexis GoodellMD Neurology [phone]/03/2018  1:52 PM

## 2018-04-19 NOTE — Progress Notes (Signed)
SLP Cancellation Note  Patient Details Name: Natalie Knight Appelt MRN: [id number] DOB: [birth year]   Cancelled treatment:       Reason Eval/Treat Not Completed: Patient not medically ready(chart reviewed; consulted NSG. ). Currently, pt remains orally intubated, unresponsive. Order for BSE is discontinued at this time w/ NSG/MD to reconsult once pt is extubated and appropriate for such.     Jerilynn SomKatherine Maysen Bonsignore, MS, CCC-SLP Anthoney Sheppard 04/19/2018, 3:41 PM

## 2018-04-19 NOTE — Progress Notes (Signed)
[facility] - [place] [facility]   PATIENT NAME: Natalie Knight    MR#:  [id number]  DATE OF BIRTH:  1936/03/30  SUBJECTIVE:  CHIEF COMPLAINT:   Chief Complaint  Patient presents with  . Altered Mental Status   Patient is intubated, on ventilation, on Levophed drip and insulin drip. REVIEW OF SYSTEMS:  Review of Systems  Unable to perform ROS: Intubated    DRUG ALLERGIES:  No Known Allergies VITALS:  Blood pressure 134/65, pulse 84, temperature (!) 101 F (38.3 C), temperature source Bladder, resp. rate 11, height 5\' 5"  (1.651 m), weight 162 lb 14.7 oz (73.9 kg), SpO2 98 %. PHYSICAL EXAMINATION:  Physical Exam  HENT:  Head: Normocephalic.  Eyes: No scleral icterus.  Bilateral pupils are pinpoint, not reactive to light.  Neck: Neck supple. No JVD present. No tracheal deviation present.  Cardiovascular: Normal rate, regular rhythm and normal heart sounds. Exam reveals no gallop.  No murmur heard. Pulmonary/Chest: Breath sounds normal. No respiratory distress. She has no wheezes. She has no rales.  Abdominal: Soft. Bowel sounds are normal. She exhibits no distension. There is no tenderness. There is no rebound.  Musculoskeletal: She exhibits no edema or tenderness.  Neurological:  Unable to exam.  Skin: No rash noted. No erythema.   LABORATORY PANEL:  Female CBC Recent Labs  Lab 04/19/18 0406  WBC 19.5*  HGB 11.8*  HCT 37.0  PLT 191   ------------------------------------------------------------------------------------------------------------------ Chemistries  Recent Labs  Lab 04/18/18 1208  04/19/18 0406 04/19/18 0600  NA 139   < > 139  --   K 3.3*   < > 3.5  --   CL 98   < > 103  --   CO2 25   < > 22  --   GLUCOSE 253*   < > 244*  --   BUN 15   < > 20  --   CREATININE 1.05*   < > 1.42*  --   CALCIUM 9.7   < > 8.4*  --   MG  --    < >  --  2.5*  AST 37  --   --   --   ALT 22  --   --   --   ALKPHOS 46  --   --   --   BILITOT 1.1   --   --   --    < > = values in this interval not displayed.   RADIOLOGY:  Dg Chest Port 1 View  Result Date: 04/19/2018 CLINICAL DATA:  Respiratory failure EXAM: PORTABLE CHEST 1 VIEW COMPARISON:  04/18/2018 FINDINGS: Endotracheal tube tip is 2.8 cm above the inferior margin of the carina, unchanged. Enteric tube tip is beyond the field of view. Right IJ central venous catheter tip is at the lower SVC. Lungs are clear.  Normal cardiomediastinal contours. IMPRESSION: Unchanged support apparatus. Electronically Signed   By: Deatra RobinsonKevin  Herman M.D.   On: 04/19/2018 05:24   Dg Chest Port 1 View  Result Date: 04/18/2018 CLINICAL DATA:  82 year old female status post central line placement. EXAM: PORTABLE CHEST 1 VIEW COMPARISON:  Chest x-ray 04/18/2018. FINDINGS: An endotracheal tube is in place with tip 2.9 cm above the carina. A nasogastric tube is seen extending into the stomach, however, the tip of the nasogastric tube extends below the lower margin of the image. There is a right-sided internal jugular central venous catheter with tip terminating in the superior cavoatrial junction. Lung volumes are low.  No consolidative airspace disease. No pleural effusions. No pneumothorax. No pulmonary nodule or mass noted. Pulmonary vasculature and the cardiomediastinal silhouette are within normal limits. IMPRESSION: 1. Support apparatus, as above. 2. No pneumothorax following right IJ central line placement. Low lung volumes without radiographic evidence of acute cardiopulmonary disease. Electronically Signed   By: Trudie Reed M.D.   On: 04/18/2018 23:48   ASSESSMENT AND PLAN:   Acute respiratory failure with hypoxia. Continue full and with support.  Continue bronchodilator therapy.  *Acute sepsis Suspected due to acute bacterial meningitis Continue ampicillin, Rocephin, Vanco and acyclovir, dexamethasone IV,  MRI of the brain and LP when able.  Follow-up CBC and blood cultures, neurochecks per routine, IV  fluids for rehydration.  *Acute comatose state Most likely secondary to above, cannot exclude possible acute stroke versus seizure Avoid psychotropic meds, neurochecks per routine, aspiration/fall precautions, ventilator with weaning as tolerated. negative RPR, consider MRI of the brain for further evaluation when feasible, abnormal EEG per Dr. Thad Ranger.   Hypotension.  Continue Levophed drip.  Elevated troponin, possible due to demanding ischemia secondary to above.  Lactic acidosis.  Due to above.  Follow-up level.  *Chronic diabetes mellitus type 2 On insulin drip.  *Chronic benign essential hypertension Hold hypertension medication due to hypotension.  *Acute hypokalemia Repleted with IV potassium, improved.  *History of DVT Hold Eliquis for now, SCDs  All the records are reviewed and case discussed with Care Management/Social Worker. Management plans discussed with the patient's daughter, and they are in agreement.  CODE STATUS: DNR  TOTAL TIME TAKING CARE OF THIS PATIENT: 36 minutes.   More than 50% of the time was spent in counseling/coordination of care: YES  POSSIBLE D/C IN ? DAYS, DEPENDING ON CLINICAL CONDITION.   Shaune Pollack M.D on 04/19/2018 at 3:04 PM  Between 7am to 6pm - Pager - [phone]  After 6pm go to [email or web address] - Therapist, nutritional Hospitalists

## 2018-04-19 NOTE — Progress Notes (Signed)
*  PRELIMINARY RESULTS* Echocardiogram 2D Echocardiogram has been performed.  Cristela BlueHege, Heinrich Fertig 04/19/2018, 9:10 AM

## 2018-04-19 NOTE — Progress Notes (Signed)
CRITICAL CARE NOTE  CC  follow up altered mental status/unresponsive secondary to suspected bacterial meningitis vs. CVA  SUBJECTIVE Patient remains critically ill Prognosis is guarded  82 year old female who was admitted on 8/6 after being found unresponsive by friends at her home on the morning of 8/6. Patient's friends began CPR, on EMS arrival patient had pulse. She was intubated in the ED for airway protection. Patient noted to have fever with nuchal rigidity. CT head negative, CT angio head/neck negative.   On arrival to ICU patient noted to have temperature of 104.7. IV tylenol given along with cooling blanket. This morning, patient continues to be febrile.   SIGNIFICANT EVENTS Patient with hypotension last night, levophed started. Right IJ central line placed.   BP 136/63   Pulse 83   Temp (!) 104.7 F (40.4 C) (Bladder)   Resp 11   Ht _0  (1.651 m)   Wt 162 lb 14.7 oz (73.9 kg)   SpO2 98%   BMI 27.11 kg/m    REVIEW OF SYSTEMS  PATIENT IS UNABLE TO PROVIDE COMPLETE REVIEW OF SYSTEM S DUE TO SEVERE CRITICAL ILLNESS AND ENCEPHALOPATHY   PHYSICAL EXAMINATION:  GENERAL:critically ill appearing, +resp distress HEAD: Normocephalic, atraumatic.  EYES: Pupils equal, round, reactive to light.  No scleral icterus.  MOUTH: Moist mucosal membrane. NECK: Supple. No thyromegaly. No nodules. No JVD.  PULMONARY: +diffuse rhonchi bilaterally, no accessory muscle use CARDIOVASCULAR: S1 and S2. Regular rate and rhythm. No murmurs, rubs, or gallops. 2+ dorsalis pedis and radial pulses bilaterally GASTROINTESTINAL: Soft, nontender, -distended. No masses. Positive bowel sounds. No hepatosplenomegaly.  MUSCULOSKELETAL: No swelling, clubbing, or edema.  NEUROLOGIC: obtunded, GCS<8, bilateral feet with plantar flexion and external rotation. Patient not withdrawing from pain.  SKIN:intact,warm,dry  ASSESSMENT AND PLAN SYNOPSIS  82 year old female admitted on 8/6 for suspected  bacterial meningitis. Unable to rule out CVA at this time. Last known normal was 8/5 evening. CXR this AM without active cardiopulmonary disease.    RESPIRATORY -Intubated for airway protection -continue Full MV support -continue Bronchodilator Therapy -Wean Fio2 and PEEP as tolerated  -will perform SAT/SBt when respiratory parameters are met and underlying condition resolved.  -repeat ABG with pH 7.44, CO2 32, O2 114, bicarb 23.3   Renal Failure- -AKI with lactic acidosis   -creatinine 1.30--> 1.42  -lactate: 3.4-->3.2-->3.4 -CK normal  -monitor urinary output closely (1200 over past 24 hours) -potassium 3.5-->replace -magnesium level ordered  Endocrinology -diabetes mellitus, non -insulin dependent -insulin gtt--> continue -close CBG monitoring with decadron use -hold metformin in setting of AKI -TSH and free T4: normal   NEUROLOGY -suspected bacterial meningitis vs. ?CVA  -continue abx and decadron for bacterial meningitis coverage -consider MRI when able -possible EEG -LP once able secondary to eliquis use, will hold heparin once scheduled -ammonia normal, RPR non-reactive -intubated and sedated: fentanyl  -minimal sedation to achieve a RASS goal: -1 Plan for SAT/SBT today   CARDIAC -ICU monitoring -use vasopressors to keep MAP>65: levophed -heparin drip for DVT prophylaxis (history of Left leg DVT in June) -troponin 0.03-->0.06-->0.28; likely secondary to demand ischemia -echo ordered -allow permissive hypertension in setting of possible CVA  -hydralazine PRN for SBP >180  ID -septic shock secondary to possible bacterial meningtitis -continue IV abx as prescribed: ampicillin, ceftriaxone, vancomycin  -antiviral: acyclovir as well  -follow up cultures: blood cultures with no growth x 24 hour -procalcitonin:0.10--> 0.47 -leukocytosis: 16.3-->19.5  DVT: heparin GI PRX: pepcid  TRANSFUSIONS AS NEEDED MONITOR FSBS ASSESS  the need for LABS as  needed   Critical Care Time devoted to patient care services described in this note is 38 minutes.   Overall, patient is critically ill, prognosis is guarded.  Patient with Multiorgan failure and at high risk for cardiac arrest and death.     Corrin Parker, M.D.  [facility]  Medical Director [place] Director [facility] Cardio-Pulmonary Department

## 2018-04-19 NOTE — Progress Notes (Signed)
Pharmacy Electrolyte Monitoring Consult:  Pharmacy consulted to assist in monitoring and replacing electrolytes in this 82 y.o. female admitted on 04/18/2018 with Altered Mental Status   Labs:  Sodium (mmol/L)  Date Value  04/18/2018 139  10/03/2014 142   Potassium (mmol/L)  Date Value  04/18/2018 3.3 (L)  10/03/2014 3.6   Magnesium (mg/dL)  Date Value  16/10/960408/02/2018 2.6 (H)   Phosphorus (mg/dL)  Date Value  54/09/811908/02/2018 2.7   Calcium (mg/dL)  Date Value  14/78/295608/02/2018 8.3 (L)   Calcium, Total (mg/dL)  Date Value  21/30/865701/21/2016 9.3   Albumin (g/dL)  Date Value  84/69/629508/02/2018 4.0  10/01/2014 2.6 (L)    Assessment/Plan: Patient initiated on insulin drip for hyperglycemia. Patient ordered MIVF LR/Potassium 20mEq @ 5675mL/hr. Will order additional 30mEq of IV potassium to maintain potassium ~ 4.   Patient ordered magnesium 2g IV x 1. Will transition patient to magnesium 4g IV x1 and continue with scheduled recheck at 2200. Goal magnesium ~ 2.   8/6 PM K+ 3.3. 10 mEq KCl IV x 2 doses ordered. BMP in AM.  Pharmacy will continue to monitor and adjust per consult.   Kaytelyn Glore S 04/19/2018 12:18 AM

## 2018-04-19 NOTE — Progress Notes (Signed)
Initial Nutrition Assessment  DOCUMENTATION CODES:   Not applicable  INTERVENTION:  Initiate Vital 1.5 at 50 mL/hr (1200 mL goal daily volume) + Pro-Stat 30 mL once daily via OGT. Provides 1900 kcal, 96 grams of protein, 912 mL H2O daily.  Provide minimal free water flush of 30 mL Q4hrs as patient is on LR at 75 mL/hr.  NUTRITION DIAGNOSIS:   Inadequate oral intake related to inability to eat as evidenced by NPO status.  GOAL:   Provide needs based on ASPEN/SCCM guidelines  MONITOR:   Vent status, Labs, Weight trends, TF tolerance, I & O's  REASON FOR ASSESSMENT:   Ventilator    ASSESSMENT:   82 year old female with PMHx of DM, HTN who is admitted after being found unresponsive by friends, was intubated on 8/6 for airway protection, with suspected bacterial meningitis vs CVA.   Patient intubated but not currently on any continuous sedation. On PRVC mode with FiO2 28%. No family members at bedside at time of RD assessment. No weight history in chart to trend. Patient is on a cooling blanket. Will use weight of 69.1 kg on 8/6 to estimate needs.  Access: OGT placed 8/6; terminates in distal stomach per chest x-ray 8/6; 67 cm at corner of mouth  MAP: 63-87 mmHg  Patient is currently intubated on ventilator support Ve: 7.2 L/min Temp (24hrs), Avg:102.7 F (39.3 C), Min:101.1 F (38.4 C), Max:104.7 F (40.4 C)  Propofol: N/A  Medications reviewed and include: Decadron 10 mg Q6hrs IV, acyclovir, ampicillin, ceftriaxone, famotidine, fentanyl gtt now off, heparin gtt, regular insulin gtt at 2.1 mL/hr, LR with KCl 20 mEq at 75 mL/hr, norepinephrine gtt at 10 mcg/min, vancomycin.  Labs reviewed: CBG 131-222, Magnesium 2.5, Creatinine 1.42.  I/O: 1200 mL UOP yesterday  Patient does not meet criteria for malnutrition at this time.  Discussed with RN and on rounds. Plan is to start tube feeds today.  NUTRITION - FOCUSED PHYSICAL EXAM:    Most Recent Value  Orbital Region   No depletion  Upper Arm Region  No depletion  Thoracic and Lumbar Region  No depletion  Buccal Region  Unable to assess  Temple Region  Mild depletion  Clavicle Bone Region  No depletion  Clavicle and Acromion Bone Region  No depletion  Scapular Bone Region  Unable to assess  Dorsal Hand  No depletion  Patellar Region  Mild depletion  Anterior Thigh Region  Mild depletion  Posterior Calf Region  Mild depletion  Edema (RD Assessment)  Mild  Hair  Reviewed  Eyes  Unable to assess  Mouth  Unable to assess  Skin  Reviewed  Nails  Reviewed     Diet Order:   Diet Order           Diet NPO time specified  Diet effective now          EDUCATION NEEDS:   No education needs have been identified at this time  Skin:  Skin Assessment: Reviewed RN Assessment  Last BM:  Unknown/PTA  Height:   Ht Readings from Last 1 Encounters:  04/18/18 5\' 5"  (1.651 m)    Weight:   Wt Readings from Last 1 Encounters:  04/19/18 162 lb 14.7 oz (73.9 kg)    Ideal Body Weight:  56.8 kg  BMI:  Body mass index is 27.11 kg/m.  Estimated Nutritional Needs:   Kcal:  1884 (PSU 2003b w/ MSJ 1173, Ve 7.2, Tmax 40.4)  Protein:  90-110 grams (1.3-1.6 grams/kg)  Fluid:  1.7 L/day (25 mL/kg)  Helane RimaLeanne Melaine Mcphee, MS, RD, LDN Office: [phone] Pager: [phone] After Hours/Weekend Pager: [phone]

## 2018-04-19 NOTE — Progress Notes (Signed)
Pharmacy Electrolyte Monitoring Consult:  Pharmacy consulted to assist in monitoring and replacing electrolytes in this 82 y.o. female admitted on 04/18/2018 with Altered Menta17l Status  Patient received magnesium 4g IV x 1 and potassium 30mEq IV X 1 on 8/6 and potassium 10mEq IV Q1hr x 2 doses at 0100 on 8/7.   MIVF: LR/6520mEq of Potassium @ 6475mL/hr.   Labs:  Sodium (mmol/L)  Date Value  04/19/2018 139  10/03/2014 142   Potassium (mmol/L)  Date Value  04/19/2018 3.5  10/03/2014 3.6   Magnesium (mg/dL)  Date Value  16/10/960408/03/2018 2.5 (H)   Phosphorus (mg/dL)  Date Value  54/09/811908/02/2018 2.7   Calcium (mg/dL)  Date Value  14/78/295608/03/2018 8.4 (L)   Calcium, Total (mg/dL)  Date Value  21/30/865701/21/2016 9.3   Albumin (g/dL)  Date Value  84/69/629508/02/2018 4.0  10/01/2014 2.6 (L)    Assessment/Plan: Potassium 10mEq Q1hr x 4. No further replacement warranted. Will recheck all electrolytes with am labs.   Patient transitioned off insulin drip around 1800.   Pharmacy will continue to monitor and adjust per consult.   Telia Amundson L 04/19/2018 7:09 PM

## 2018-04-19 NOTE — Progress Notes (Signed)
eeg completed ° °

## 2018-04-19 NOTE — Progress Notes (Signed)
Had discussion with pt's daughter, Natalie Knight, at bedside regarding pt being critically ill with multiorgan failure.  Discussed goals of care and if they had previously had discussions before regarding code status.  Per pt's daughter, her mother had expressed that she would not want to be resuscitated.  Per Natalie Knight, she would like to change her mother's Code status to DNR, and to continue with current aggressive care measures for now.  Pt made DNR.   Harlon DittyJeremiah Oracio Galen, AGACNP-BC [facility] Pager: [phone]

## 2018-04-19 NOTE — Progress Notes (Signed)
Pharmacy Antibiotic Note  Natalie Knight is a 82 y.o. female admitted on 04/18/2018 with meningitis.  Pharmacy has been consulted for vancomycin, ceftriaxone and ampicillin dosing. She has a known history which includes anemia chronic disease, dementia, gout, DVT on Eliquis presenting from her apartment, found unresponsive in the bed, patient nonverbal/not following commands/no eye-opening, family member started CPR, EMS brought to the emergency room, patient noted to be comatose then intubated.   Plan: Due to apixaban, patient will be unable to get LP until afternoon on 8/8.  Vancomycin 1g IV Q24hr for goal trough of 15-20. Will obtain trough prior to scheduled dose on 8/9.    Ampicillin 2g IV Q6hr.   Acyclovir 700mg  IV Q12hr.   Ceftriaxone 2g IV Q12hr.   Dexamethazone 10mg  IV Q6hr x 4 days.    Height: 5\' 5"  (165.1 cm) Weight: 162 lb 14.7 oz (73.9 kg) IBW/kg (Calculated) : 57  Temp (24hrs), Avg:101.8 F (38.8 C), Min:98.5 F (36.9 C), Max:104.7 F (40.4 C)  Recent Labs  Lab 04/18/18 1208 04/18/18 1527 04/18/18 1755 04/18/18 2041 04/18/18 2314 04/19/18 0406  WBC 16.3*  --   --   --   --  19.5*  CREATININE 1.05*  --   --   --  1.30* 1.42*  LATICACIDVEN  --  3.4* 3.2* 3.4*  --   --     No Known Allergies  Antimicrobials this admission: Cefepime 8/6 x1  Ampicillin 8/6 >>  Vancomycin 8/6 >>  Ceftriaxone 8/6>> Acyclovir 8/6 >>  Microbiology results: 8/6 BCx: no growth < 24 hours  8/6 UCx: no growth  8/6 MRSA PCR: negative   Thank you for allowing pharmacy to be a part of this patient's care.  Cristi Gwynn L,  04/19/2018 7:15 PM

## 2018-04-19 NOTE — Progress Notes (Signed)
CRITICAL CARE NOTE  CC  follow up altered mental status/unresponsive secondary to suspected bacterial meningitis vs. CVA  SUBJECTIVE Patient remains critically ill Prognosis is guarded  82 year old female who was admitted on 8/6 after being found unresponsive by friends at her home on the morning of 8/6. Patient's friends began CPR, on EMS arrival patient had pulse. She was intubated in the ED for airway protection. Patient noted to have fever with nuchal rigidity. CT head negative, CT angio head/neck negative.   On arrival to ICU patient noted to have temperature of 104.7. IV tylenol given along with cooling blanket. This morning, patient continues to be febrile.   SIGNIFICANT EVENTS Patient with hypotension last night, levophed started. Right IJ central line placed.   BP 119/63   Pulse 86   Temp (!) 101.6 F (38.7 C) (Bladder)   Resp 10   Ht '5\' 5"'  (1.651 m)   Wt 73.9 kg (162 lb 14.7 oz)   SpO2 99%   BMI 27.11 kg/m    REVIEW OF SYSTEMS  PATIENT IS UNABLE TO PROVIDE COMPLETE REVIEW OF SYSTEM S DUE TO SEVERE CRITICAL ILLNESS AND ENCEPHALOPATHY   PHYSICAL EXAMINATION:  GENERAL:critically ill appearing, +resp distress HEAD: Normocephalic, atraumatic.  EYES: Pupils equal, round, reactive to light.  No scleral icterus.  MOUTH: Moist mucosal membrane. NECK: Supple. No thyromegaly. No nodules. No JVD.  PULMONARY: +diffuse rhonchi bilaterally, no accessory muscle use CARDIOVASCULAR: S1 and S2. Regular rate and rhythm. No murmurs, rubs, or gallops. 2+ dorsalis pedis and radial pulses bilaterally GASTROINTESTINAL: Soft, nontender, -distended. No masses. Positive bowel sounds. No hepatosplenomegaly.  MUSCULOSKELETAL: No swelling, clubbing, or edema.  NEUROLOGIC: obtunded, GCS<8, bilateral feet with plantar flexion and external rotation. Patient not withdrawing from pain.  SKIN:intact,warm,dry  ASSESSMENT AND PLAN SYNOPSIS  82 year old female admitted on 8/6 for suspected  bacterial meningitis. Unable to rule out CVA at this time. Last known normal was 8/5 evening. CXR this AM without active cardiopulmonary disease.    RESPIRATORY -Intubated for airway protection -continue Full MV support -continue Bronchodilator Therapy -Wean Fio2 and PEEP as tolerated  -will perform SAT/SBt when respiratory parameters are met and underlying condition resolved.  -repeat ABG with pH 7.44, CO2 32, O2 114, bicarb 23.3   Renal Failure- -AKI with lactic acidosis   -creatinine 1.30--> 1.42  -lactate: 3.4-->3.2-->3.4 -CK normal  -monitor urinary output closely (1200 over past 24 hours) -potassium 3.5-->replace -magnesium level ordered  Endocrinology -diabetes mellitus, non -insulin dependent -insulin gtt--> continue -close CBG monitoring with decadron use -hold metformin in setting of AKI -TSH and free T4: normal   NEUROLOGY -suspected bacterial meningitis vs. ?CVA  -continue abx and decadron for bacterial meningitis coverage -consider MRI when able -possible EEG -LP once able secondary to eliquis use, will hold heparin once scheduled -ammonia normal, RPR non-reactive -intubated and sedated: fentanyl  -minimal sedation to achieve a RASS goal: -1   CARDIAC -ICU monitoring -use vasopressors to keep MAP>65: levophed -heparin drip for DVT prophylaxis (history of Left leg DVT in June) -troponin 0.03-->0.06-->0.28; likely secondary to demand ischemia -echo ordered -allow permissive hypertension in setting of possible CVA  -hydralazine PRN for SBP >180  ID -septic shock secondary to possible bacterial meningtitis -continue IV abx as prescribed: ampicillin, ceftriaxone, vancomycin  -antiviral: acyclovir as well  -follow up cultures: blood cultures with no growth x 24 hour -procalcitonin:0.10--> 0.47 -leukocytosis: 16.3-->19.5  DVT: heparin GI PRX: pepcid  TRANSFUSIONS AS NEEDED MONITOR FSBS ASSESS the need for LABS  as needed   Critical Care Time  devoted to patient care services described in this note is 38 minutes.   Overall, patient is critically ill, prognosis is guarded.  Patient with Multiorgan failure and at high risk for cardiac arrest and death.

## 2018-04-20 DIAGNOSIS — G40901 Epilepsy, unspecified, not intractable, with status epilepticus: Secondary | ICD-10-CM

## 2018-04-20 LAB — GLUCOSE, CAPILLARY
GLUCOSE-CAPILLARY: 349 mg/dL — AB (ref 70–99)
GLUCOSE-CAPILLARY: 315 mg/dL — AB (ref 70–99)
GLUCOSE-CAPILLARY: 252 mg/dL — AB (ref 70–99)
GLUCOSE-CAPILLARY: 181 mg/dL — AB (ref 70–99)
GLUCOSE-CAPILLARY: 128 mg/dL — AB (ref 70–99)
GLUCOSE-CAPILLARY: 145 mg/dL — AB (ref 70–99)
GLUCOSE-CAPILLARY: 145 mg/dL — AB (ref 70–99)
GLUCOSE-CAPILLARY: 169 mg/dL — AB (ref 70–99)
GLUCOSE-CAPILLARY: 195 mg/dL — AB (ref 70–99)
GLUCOSE-CAPILLARY: 175 mg/dL — AB (ref 70–99)
Glucose-Capillary: 386 mg/dL — ABNORMAL HIGH (ref 70–99)
Glucose-Capillary: 317 mg/dL — ABNORMAL HIGH (ref 70–99)
Glucose-Capillary: 238 mg/dL — ABNORMAL HIGH (ref 70–99)
Glucose-Capillary: 177 mg/dL — ABNORMAL HIGH (ref 70–99)
Glucose-Capillary: 146 mg/dL — ABNORMAL HIGH (ref 70–99)
Glucose-Capillary: 152 mg/dL — ABNORMAL HIGH (ref 70–99)
Glucose-Capillary: 154 mg/dL — ABNORMAL HIGH (ref 70–99)
Glucose-Capillary: 153 mg/dL — ABNORMAL HIGH (ref 70–99)
Glucose-Capillary: 137 mg/dL — ABNORMAL HIGH (ref 70–99)
Glucose-Capillary: 147 mg/dL — ABNORMAL HIGH (ref 70–99)

## 2018-04-20 LAB — CBC
HCT: 34.8 % — ABNORMAL LOW (ref 35.0–47.0)
Hemoglobin: 11.3 g/dL — ABNORMAL LOW (ref 12.0–16.0)
MCH: 25 pg — AB (ref 26.0–34.0)
MCHC: 32.3 g/dL (ref 32.0–36.0)
MCV: 77.3 fL — ABNORMAL LOW (ref 80.0–100.0)
PLATELETS: 177 10*3/uL (ref 150–440)
RBC: 4.51 MIL/uL (ref 3.80–5.20)
RDW: 15.1 % — ABNORMAL HIGH (ref 11.5–14.5)
WBC: 28.8 10*3/uL — ABNORMAL HIGH (ref 3.6–11.0)

## 2018-04-20 LAB — MAGNESIUM: Magnesium: 2.3 mg/dL (ref 1.7–2.4)

## 2018-04-20 LAB — BASIC METABOLIC PANEL
Anion gap: 14 (ref 5–15)
BUN: 36 mg/dL — AB (ref 8–23)
CO2: 20 mmol/L — ABNORMAL LOW (ref 22–32)
CREATININE: 1.73 mg/dL — AB (ref 0.44–1.00)
Calcium: 8.3 mg/dL — ABNORMAL LOW (ref 8.9–10.3)
Chloride: 103 mmol/L (ref 98–111)
GFR calc Af Amer: 31 mL/min — ABNORMAL LOW (ref >60)
GFR, EST NON AFRICAN AMERICAN: 27 mL/min — AB (ref >60)
Glucose, Bld: 327 mg/dL — ABNORMAL HIGH (ref 70–99)
POTASSIUM: 3.7 mmol/L (ref 3.5–5.1)
Sodium: 137 mmol/L (ref 135–145)

## 2018-04-20 LAB — VANCOMYCIN, RANDOM: VANCOMYCIN RM: 19

## 2018-04-20 LAB — PHOSPHORUS: Phosphorus: 1.6 mg/dL — ABNORMAL LOW (ref 2.5–4.6)

## 2018-04-20 LAB — APTT
APTT: 116 s — AB (ref 24–36)
APTT: 56 s — AB (ref 24–36)

## 2018-04-20 LAB — HEPARIN LEVEL (UNFRACTIONATED)
HEPARIN UNFRACTIONATED: 1.11 [IU]/mL — AB (ref 0.30–0.70)
Heparin Unfractionated: 0.64 IU/mL (ref 0.30–0.70)

## 2018-04-20 LAB — PROCALCITONIN: Procalcitonin: 0.59 ng/mL

## 2018-04-20 MED ORDER — POTASSIUM PHOSPHATES 15 MMOLE/5ML IV SOLN
20.0000 mmol | Freq: Once | INTRAVENOUS | Status: AC
  Administered 2018-04-20: 20 mmol via INTRAVENOUS
  Filled 2018-04-20: qty 6.67

## 2018-04-20 MED ORDER — SODIUM CHLORIDE 0.9 % IV SOLN
INTRAVENOUS | Status: DC
  Administered 2018-04-20: 2.9 [IU]/h via INTRAVENOUS
  Administered 2018-04-20: 6.5 [IU]/h via INTRAVENOUS
  Administered 2018-04-21: 3.7 [IU]/h via INTRAVENOUS
  Filled 2018-04-20 (×3): qty 1

## 2018-04-20 MED ORDER — SODIUM CHLORIDE 0.9 % IV BOLUS
1000.0000 mL | Freq: Once | INTRAVENOUS | Status: AC
  Administered 2018-04-20: 1000 mL via INTRAVENOUS

## 2018-04-20 MED ORDER — FAMOTIDINE IN NACL 20-0.9 MG/50ML-% IV SOLN
20.0000 mg | INTRAVENOUS | Status: DC
  Administered 2018-04-20 – 2018-04-23 (×4): 20 mg via INTRAVENOUS
  Filled 2018-04-20 (×4): qty 50

## 2018-04-20 NOTE — Progress Notes (Signed)
[place] for heparin drip management  Indication: DVT  No Known Allergies  Patient Measurements: Height: '5\' 5"'  (165.1 cm) Weight: 167 lb 8.8 oz (76 kg) IBW/kg (Calculated) : 57   Vital Signs: Temp: 98.4 F (36.9 C) (08/08 0400) Temp Source: Oral (08/08 0400) BP: 151/70 (08/08 0600) Pulse Rate: 82 (08/08 0600)  Labs: Recent Labs    04/18/18 1208 04/18/18 1419  04/18/18 2044 04/18/18 2314 04/19/18 0222 04/19/18 0349 04/19/18 0406 04/19/18 0600 04/19/18 1556 04/20/18 0642  HGB 13.5  --   --   --   --   --   --  11.8*  --   --  11.3*  HCT 42.2  --   --   --   --   --   --  37.0  --   --  34.8*  PLT 200  --   --   --   --   --   --  191  --   --  177  APTT  --   --    < >  --   --   --   --   --  >160* >160* 116*  LABPROT  --  13.6  --   --   --   --   --   --   --   --   --   INR  --  1.05  --   --   --   --   --   --   --   --   --   HEPARINUNFRC  --   --   --   --   --  1.60*  --   --   --   --   --   CREATININE 1.05*  --   --   --  1.30*  --   --  1.42*  --   --  1.73*  CKTOTAL 161  --   --   --   --   --   --   --   --   --   --   TROPONINI 0.03*  --    < > 0.06*  --   --  0.28*  --  0.21*  --   --    < > = values in this interval not displayed.    Estimated Creatinine Clearance: 26.9 mL/min (A) (by C-G formula based on SCr of 1.73 mg/dL (H)).   Medical History: Past Medical History:  Diagnosis Date  . Diabetes mellitus without complication ([place])   . Hypertension     Medications:  Scheduled:  . albuterol  2.5 mg Nebulization Q4H  . chlorhexidine gluconate (MEDLINE KIT)  15 mL Mouth Rinse BID  . dexamethasone  10 mg Intravenous Q6H  . feeding supplement (PRO-STAT SUGAR FREE 64)  30 mL Per Tube Daily  . mouth rinse  15 mL Mouth Rinse 10 times per day  . senna-docusate  1 tablet Per Tube BID   Infusions:  . sodium chloride    . acyclovir Stopped (04/19/18 2311)  . ampicillin (OMNIPEN) IV Stopped (04/20/18 0768)  .  cefTRIAXone (ROCEPHIN)  IV Stopped (04/20/18 0881)  . famotidine (PEPCID) IV Stopped (04/19/18 2241)  . feeding supplement (VITAL 1.5 CAL) 50 mL/hr at 04/19/18 1800  . fentaNYL infusion INTRAVENOUS Stopped (04/19/18 0945)  . heparin 650 Units/hr (04/19/18 1815)  . insulin (NOVOLIN-R) infusion 10.3 Units/hr (04/20/18 0654)  . lactated ringers with kcl 75 mL/hr  at 04/20/18 0739  . norepinephrine (LEVOPHED) 71m / 2561minfusion 10 mcg/min (04/20/18 0627)  . vancomycin Stopped (04/19/18 2124)    Assessment: Pharmacy consulted for heparin drip management for [age]o female admitted with meningitis and requiring mechanical ventilation. Patient previously on apixaban for left leg DVT in 02/2018. Heparin currently infusing at 650units/hr.  Goal of Therapy:  Heparin level 0.3-0.7 units/ml aPTT 66-102 seconds Monitor platelets by anticoagulation protocol: Yes   Plan:  8/8 AM aPTT slightly supratherapeutic. Hgb/Hct/Plts remains stable. Will decrease the infusion to 550units/hr and recheck aPTT and HL in 8 hours.   Pharmacy will continue to monitor and adjust per consult.   ShPernell DuprePharmD, BCPS Clinical Pharmacist 04/20/2018 7:43 AM

## 2018-04-20 NOTE — Progress Notes (Signed)
Pharmacy Electrolyte Monitoring Consult:  Pharmacy consulted to assist in monitoring and replacing electrolytes in this 82 y.o. female admitted on 04/18/2018 with Altered Mental Status  Patient is on insulin drip.   MIVF: LR/1020mEq of Potassium @ 10975mL/hr.   Labs:  Sodium (mmol/L)  Date Value  04/20/2018 137  10/03/2014 142   Potassium (mmol/L)  Date Value  04/20/2018 3.7  10/03/2014 3.6   Magnesium (mg/dL)  Date Value  40/98/119108/04/2018 2.3   Phosphorus (mg/dL)  Date Value  47/82/956208/04/2018 1.6 (L)   Calcium (mg/dL)  Date Value  13/08/657808/04/2018 8.3 (L)   Calcium, Total (mg/dL)  Date Value  46/96/295201/21/2016 9.3   Albumin (g/dL)  Date Value  84/13/244008/02/2018 4.0  10/01/2014 2.6 (L)    Assessment/Plan: 8/8 AM KPhos 20mmol IV x 1 dose ordered. No additional replacement needed at this time.   Labs ordered for F/U on 8/9  Pharmacy will continue to monitor and adjust per consult.   Gardner CandleSheema M Keyvon Herter, PharmD, BCPS Clinical Pharmacist 04/20/2018 7:50 AM

## 2018-04-20 NOTE — Progress Notes (Signed)
CRITICAL CARE NOTE  CC  follow up respiratory failure secondary to suspected bacterial meningitis  SUBJECTIVE Patient remains critically ill Prognosis is guarded  82 year old female who was found unresponsive at her home on 8/6 and admitted with suspected bacterial meningitis. CT head, MRI brain, and EEG negative. Patient is afebrile this morning.    SIGNIFICANT EVENTS  Patient has had only 333 of urine output over the past 24 hours.  BP (!) 176/78 (BP Location: Left Arm)   Pulse 77   Temp 98.1 F (36.7 C) (Axillary)   Resp 18   Ht 5\' 5"  (1.651 m)   Wt 76 kg   SpO2 99%   BMI 27.88 kg/m    REVIEW OF SYSTEMS  PATIENT IS UNABLE TO PROVIDE COMPLETE REVIEW OF SYSTEM S DUE TO SEVERE CRITICAL ILLNESS AND ENCEPHALOPATHY   PHYSICAL EXAMINATION:  GENERAL:critically ill appearing, +resp distress HEAD: Normocephalic, atraumatic.  EYES: Unable to assess, patient squeezing eyes shut.  No scleral icterus.  MOUTH: Moist mucosal membrane. NECK: Supple. No thyromegaly. No nodules. No JVD.  PULMONARY: +rhonchi bilaterally  CARDIOVASCULAR: S1 and S2. Regular rate and rhythm. No murmurs, rubs, or gallops. 2+ dorsalis pedis pulses bilaterally GASTROINTESTINAL: Soft, nontender, -distended. No masses. Diminished bowel sounds. No hepatosplenomegaly.  MUSCULOSKELETAL: No swelling, clubbing, or edema.  NEUROLOGIC: obtunded, GCS<8 SKIN:intact,warm,dry  ASSESSMENT AND PLAN SYNOPSIS  82 year old female who was found unresponsive at her home on 8/6. Last known normal was evening of 8/5. Patient admitted for suspected bacterial meningitis.    Respiratory -patient intubated in ED for airway protection -continue Full MV support -continue Bronchodilator Therapy -Wean Fio2 and PEEP as tolerated -will perform SAT today, SBT if tolerated   Renal Failure- AKI with lactic acidosis -creatinine increasing: 1.05-->1.30-->1.42-->1.73  -will try 1L fluid bolus -UO 333 over the past 24  hours -hypokalemia resolved: 3.7, replace with insulin gtt requirement -hypophosphatemia: 1.6, will replace -magnesium normal   Endocrinology: -diabetes mellitus, non-insulin dependent -insulin gtt--> continue -close CBG monitoring with decadron use -hold metformin with AKI   NEUROLOGY -patient off all sedation, minimally responsive -patient grimaced to deep sternal rub today -suspected bacterial meningitis: unsure of clinical utility of LP in setting of antibiotic therapy -MRI, CT head, CT angio negative for acute abnormality.  -EEG with no evidence of epileptiform activity   CARDIAC -ICU monitoring -continue to wean levophed as tolerated -heparin for dvt prophylaxis -echo 8/7 shows mild LVH and EF of 65-70%   ID -continue IV abx as prescribed for suspected bacterial meningitis; ampicillin, ceftriaxone, vancomycin  -acyclovir for HSV coverage  -continue dexamethasone -follow up cultures: no growth x 2 days  -urine culture: no growth -afebrile -leukocytosis trending up: 16.3-->19.5-->28.8  -secondary to infection and steroid use -procalcitonin: <0.10-->0.47-->0.59   DVT: heparin GI PRX: pepcid  TRANSFUSIONS AS NEEDED MONITOR FSBS ASSESS the need for LABS as needed   Critical Care Time devoted to patient care services described in this note is 36 minutes.   Overall, patient is critically ill, prognosis is guarded.  Patient with Multiorgan failure and at high risk for cardiac arrest and death.      Natalie Knight, M.D.  [facility]  Medical Director [facility] Director [facility]

## 2018-04-20 NOTE — Progress Notes (Signed)
Subjective: Patient remains intubated.  No sedation.  Remains unresponsive.    Objective: Current vital signs: BP (!) 125/59   Pulse 74   Temp 97.8 F (36.6 C) (Axillary)   Resp 10   Ht _0  (1.651 m)   Wt 76 kg   SpO2 99%   BMI 27.88 kg/m  Vital signs in last 24 hours: Temp:  [97.7 F (36.5 C)-98.8 F (37.1 C)] 97.8 F (36.6 C) (08/08 1600) Pulse Rate:  [73-96] 74 (08/08 1800) Resp:  [9-18] 10 (08/08 1800) BP: (93-176)/(58-89) 125/59 (08/08 1800) SpO2:  [98 %-100 %] 99 % (08/08 1800) FiO2 (%):  [28 %] 28 % (08/08 1600) Weight:  [76 kg] 76 kg (08/08 0340)  Intake/Output from previous day: 08/07 0701 - 08/08 0700 In: 4412.2 [I.V.:2196.1; NG/GT:989.2; IV Piggyback:1227] Out: 333 [Urine:333] Intake/Output this shift: Total I/O In: 3087.4 [I.V.:1210.4; Other:30; NG/GT:730; IV Piggyback:1116.9] Out: 53 [Urine:340] Nutritional status:  Diet Order            Diet NPO time specified  Diet effective now              Neurologic Exam: Mental Status: Patient does not respond to verbal stimuli.  Grimaces and attempts to localize to deep sternal rub.  Does not follow commands.  No verbalizations noted.  Cranial Nerves: II: patient does not respond confrontation bilaterally, pupils right 2 mm, left 2 mm,and reactive bilaterally III,IV,VI: doll's response present bilaterally. V,VII: corneal reflex present bilaterally  VIII: patient does not respond to verbal stimuli IX,X: gag reflex reduced, XI: trapezius strength unable to test bilaterally XII: tongue strength unable to test Motor: Moves both upper extremities Sensory: Grimaces to noxious stimuli in all extremities   Lab Results: Basic Metabolic Panel: Recent Labs  Lab 04/18/18 1208 04/18/18 1527 04/18/18 2314 04/19/18 0406 04/19/18 0600 04/20/18 0642  NA 139  --  139 139  --  137  K 3.3*  --  3.3* 3.5  --  3.7  CL 98  --  104 103  --  103  CO2 25  --  25 22  --  20*  GLUCOSE 253*  --  153* 244*  --  327*   BUN 15  --  17 20  --  36*  CREATININE 1.05*  --  1.30* 1.42*  --  1.73*  CALCIUM 9.7  --  8.3* 8.4*  --  8.3*  MG  --  0.9* 2.6*  --  2.5* 2.3  PHOS  --  2.7  --   --   --  1.6*    Liver Function Tests: Recent Labs  Lab 04/18/18 1208  AST 37  ALT 22  ALKPHOS 46  BILITOT 1.1  PROT 7.3  ALBUMIN 4.0   Recent Labs  Lab 04/18/18 1208  LIPASE 17   Recent Labs  Lab 04/18/18 1755  AMMONIA 12    CBC: Recent Labs  Lab 04/18/18 1208 04/19/18 0406 04/20/18 0642  WBC 16.3* 19.5* 28.8*  NEUTROABS 15.1*  --   --   HGB 13.5 11.8* 11.3*  HCT 42.2 37.0 34.8*  MCV 77.8* 77.1* 77.3*  PLT 200 191 177    Cardiac Enzymes: Recent Labs  Lab 04/18/18 1208 04/18/18 1755 04/18/18 2044 04/19/18 0349 04/19/18 0600  CKTOTAL 161  --   --   --   --   TROPONINI 0.03* 0.06* 0.06* 0.28* 0.21*    Lipid Panel: No results for input(s): CHOL, TRIG, HDL, CHOLHDL, VLDL, LDLCALC in the last 168  hours.  CBG: Recent Labs  Lab 04/20/18 1404 04/20/18 1450 04/20/18 1552 04/20/18 1713 04/20/18 1759  GLUCAP 154* 153* 128* 145* 145*    Microbiology: Results for orders placed or performed during the hospital encounter of 04/18/18  Urine culture     Status: None   Collection Time: 04/18/18 12:08 PM  Result Value Ref Range Status   Specimen Description   Final    URINE, RANDOM Performed at [facility], [address], [place], [place] [address]    Special Requests   Final    NONE Performed at [facility], [address], [place], [place] [address]    Culture   Final    NO GROWTH Performed at [facility], [place] [address] [place], [place] [address]    Report Status 04/19/2018 FINAL  Final  Blood Culture (routine x 2)     Status: None (Preliminary result)   Collection Time: 04/18/18  1:12 PM  Result Value Ref Range Status   Specimen Description BLOOD RIGHT HAND  Final   Special Requests   Final    BOTTLES DRAWN AEROBIC AND ANAEROBIC Blood Culture  results may not be optimal due to an inadequate volume of blood received in culture bottles   Culture   Final    NO GROWTH 2 DAYS Performed at [facility], [address] [place], [place] [address]    Report Status PENDING  Incomplete  Blood Culture (routine x 2)     Status: None (Preliminary result)   Collection Time: 04/18/18  1:20 PM  Result Value Ref Range Status   Specimen Description BLOOD RIGHT [facility]  Final   Special Requests   Final    BOTTLES DRAWN AEROBIC AND ANAEROBIC Blood Culture adequate volume   Culture   Final    NO GROWTH 2 DAYS Performed at [facility], [address] [place], [place] [address]    Report Status PENDING  Incomplete  MRSA PCR Screening     Status: None   Collection Time: 04/18/18  5:19 PM  Result Value Ref Range Status   MRSA by PCR NEGATIVE NEGATIVE Final    Comment:        The GeneXpert MRSA Assay (FDA approved for NASAL specimens only), is one component of a comprehensive MRSA colonization surveillance program. It is not intended to diagnose MRSA infection nor to guide or monitor treatment for MRSA infections. Performed at [facility], [address] [place],  [address]     Coagulation Studies: Recent Labs    04/18/18 1419  LABPROT 13.6  INR 1.05    Imaging: Mr Brain Wo Contrast  Result Date: 04/19/2018 CLINICAL DATA:  Dementia.  Found unresponsive. EXAM: MRI HEAD WITHOUT CONTRAST TECHNIQUE: Multiplanar, multiecho pulse sequences of the brain and surrounding structures were obtained without intravenous contrast. COMPARISON:  04/18/2018 FINDINGS: Brain: Diffusion imaging does not show any acute or subacute infarction. The brainstem and cerebellum are normal. Cerebral hemispheres show generalized atrophy with moderate chronic small-vessel ischemic changes of the deep white matter and thalami. No cortical or large vessel territory infarction. No mass lesion, hemorrhage, hydrocephalus or extra-axial  collection. Chronic arachnoid herniation into the sella. Vascular: Major vessels at the base of the brain show flow. Skull and upper cervical spine: Negative Sinuses/Orbits: Clear/normal Other: None IMPRESSION: No acute finding by MRI. Generalized atrophy and chronic small-vessel ischemic changes of the thalami and cerebral hemispheric white matter. Electronically Signed   By: Jan Fireman.D.  On: 04/19/2018 15:36   Dg Chest Port 1 View  Result Date: 04/19/2018 CLINICAL DATA:  Respiratory failure EXAM: PORTABLE CHEST 1 VIEW COMPARISON:  04/18/2018 FINDINGS: Endotracheal tube tip is 2.8 cm above the inferior margin of the carina, unchanged. Enteric tube tip is beyond the field of view. Right IJ central venous catheter tip is at the lower SVC. Lungs are clear.  Normal cardiomediastinal contours. IMPRESSION: Unchanged support apparatus. Electronically Signed   By: Ulyses Jarred M.D.   On: 04/19/2018 05:24   Dg Chest Port 1 View  Result Date: 04/18/2018 CLINICAL DATA:  82 year old female status post central line placement. EXAM: PORTABLE CHEST 1 VIEW COMPARISON:  Chest x-ray 04/18/2018. FINDINGS: An endotracheal tube is in place with tip 2.9 cm above the carina. A nasogastric tube is seen extending into the stomach, however, the tip of the nasogastric tube extends below the lower margin of the image. There is a right-sided internal jugular central venous catheter with tip terminating in the superior cavoatrial junction. Lung volumes are low. No consolidative airspace disease. No pleural effusions. No pneumothorax. No pulmonary nodule or mass noted. Pulmonary vasculature and the cardiomediastinal silhouette are within normal limits. IMPRESSION: 1. Support apparatus, as above. 2. No pneumothorax following right IJ central line placement. Low lung volumes without radiographic evidence of acute cardiopulmonary disease. Electronically Signed   By: Vinnie Langton M.D.   On: 04/18/2018 23:48    Medications:  I  have reviewed the patient's current medications. Scheduled: . albuterol  2.5 mg Nebulization Q4H  . chlorhexidine gluconate (MEDLINE KIT)  15 mL Mouth Rinse BID  . dexamethasone  10 mg Intravenous Q6H  . feeding supplement (PRO-STAT SUGAR FREE 64)  30 mL Per Tube Daily  . mouth rinse  15 mL Mouth Rinse 10 times per day  . senna-docusate  1 tablet Per Tube BID    Assessment/Plan: Patient remains intubated.  Has not been febrile since midday on yesterday but white blood cell count continues to rise.  Echocardiogram with EF of 65-70%.  EEG shows no epileptiform activity.  MRI shows no acute changes.  Remains on broad spectrum antibiotics and Acyclovir.    Recommendations: 1. If no source of infection patient may benefit from LP.     LOS: 2 days   Alexis Goodell, MD Neurology [phone] 04/20/2018  6:20 PM

## 2018-04-20 NOTE — Progress Notes (Signed)
[facility] for heparin drip management  Indication: DVT  No Known Allergies  Patient Measurements: Height: '5\' 5"'  (165.1 cm) Weight: 167 lb 8.8 oz (76 kg) IBW/kg (Calculated) : 57   Vital Signs: Temp: 97.8 F (36.6 C) (08/08 1600) Temp Source: Axillary (08/08 1600) BP: 126/65 (08/08 1700) Pulse Rate: 75 (08/08 1700)  Labs: Recent Labs    04/18/18 1208 04/18/18 1419  04/18/18 2044 04/18/18 2314 04/19/18 0222 04/19/18 0349 04/19/18 0406 04/19/18 0600 04/19/18 1556 04/20/18 0642 04/20/18 1601  HGB 13.5  --   --   --   --   --   --  11.8*  --   --  11.3*  --   HCT 42.2  --   --   --   --   --   --  37.0  --   --  34.8*  --   PLT 200  --   --   --   --   --   --  191  --   --  177  --   APTT  --   --    < >  --   --   --   --   --  >160* >160* 116* 56*  LABPROT  --  13.6  --   --   --   --   --   --   --   --   --   --   INR  --  1.05  --   --   --   --   --   --   --   --   --   --   HEPARINUNFRC  --   --   --   --   --  1.60*  --   --   --   --  1.11* 0.64  CREATININE 1.05*  --   --   --  1.30*  --   --  1.42*  --   --  1.73*  --   CKTOTAL 161  --   --   --   --   --   --   --   --   --   --   --   TROPONINI 0.03*  --    < > 0.06*  --   --  0.28*  --  0.21*  --   --   --    < > = values in this interval not displayed.    Estimated Creatinine Clearance: 26.9 mL/min (A) (by C-G formula based on SCr of 1.73 mg/dL (H)).   Medical History: Past Medical History:  Diagnosis Date  . Diabetes mellitus without complication ([place])   . Hypertension     Medications:  Scheduled:  . albuterol  2.5 mg Nebulization Q4H  . chlorhexidine gluconate (MEDLINE KIT)  15 mL Mouth Rinse BID  . dexamethasone  10 mg Intravenous Q6H  . feeding supplement (PRO-STAT SUGAR FREE 64)  30 mL Per Tube Daily  . mouth rinse  15 mL Mouth Rinse 10 times per day  . senna-docusate  1 tablet Per Tube BID   Infusions:  . sodium chloride    . acyclovir Stopped  (04/20/18 1145)  . ampicillin (OMNIPEN) IV 2 g (04/20/18 1715)  . cefTRIAXone (ROCEPHIN)  IV 2 g (04/20/18 1714)  . famotidine (PEPCID) IV    . feeding supplement (VITAL 1.5 CAL) 50 mL/hr at 04/20/18 1600  . fentaNYL infusion INTRAVENOUS  100 mcg/hr (04/20/18 1600)  . heparin 550 Units/hr (04/20/18 1600)  . insulin (NOVOLIN-R) infusion 4.8 Units/hr (04/20/18 1714)  . lactated ringers with kcl 75 mL/hr at 04/20/18 1600  . norepinephrine (LEVOPHED) 13m / 2535minfusion 5.973 mcg/min (04/20/18 1600)  . vancomycin Stopped (04/19/18 2124)    Assessment: Pharmacy consulted for heparin drip management for [age]o female admitted with meningitis and requiring mechanical ventilation. Patient previously on apixaban for left leg DVT in 02/2018. Heparin currently infusing at 550units/hr.  Goal of Therapy:  Heparin level 0.3-0.7 units/ml aPTT 66-102 seconds Monitor platelets by anticoagulation protocol: Yes   Plan:  8/8 AM aPTT slightly subratherapeutic with a therapeutic HL. Hgb/Hct/Plts remains stable. Will increase the infusion to 600units/hr and recheck aPTT and HL in 8 hours.   Pharmacy will continue to monitor and adjust per consult.   RoDallie PilesPharmD Clinical Pharmacist 04/20/2018 5:50 PM

## 2018-04-20 NOTE — Progress Notes (Signed)
CRITICAL CARE NOTE  CC  follow up respiratory failure secondary to suspected bacterial meningitis  SUBJECTIVE Patient remains critically ill Prognosis is guarded  82 year old female who was found unresponsive at her home on 8/6 and admitted with suspected bacterial meningitis. CT head, MRI brain, and EEG negative. Patient is afebrile this morning.    SIGNIFICANT EVENTS  Patient has had only 333 of urine output over the past 24 hours.  BP (!) 151/70   Pulse 82   Temp 98.4 F (36.9 C) (Oral)   Resp 15   Ht 5\' 5"  (1.651 m)   Wt 76 kg   SpO2 100%   BMI 27.88 kg/m    REVIEW OF SYSTEMS  PATIENT IS UNABLE TO PROVIDE COMPLETE REVIEW OF SYSTEM S DUE TO SEVERE CRITICAL ILLNESS AND ENCEPHALOPATHY   PHYSICAL EXAMINATION:  GENERAL:critically ill appearing, +resp distress HEAD: Normocephalic, atraumatic.  EYES: Unable to assess, patient squeezing eyes shut.  No scleral icterus.  MOUTH: Moist mucosal membrane. NECK: Supple. No thyromegaly. No nodules. No JVD.  PULMONARY: +rhonchi bilaterally  CARDIOVASCULAR: S1 and S2. Regular rate and rhythm. No murmurs, rubs, or gallops. 2+ dorsalis pedis pulses bilaterally GASTROINTESTINAL: Soft, nontender, -distended. No masses. Diminished bowel sounds. No hepatosplenomegaly.  MUSCULOSKELETAL: No swelling, clubbing, or edema.  NEUROLOGIC: obtunded, GCS<8 SKIN:intact,warm,dry  ASSESSMENT AND PLAN SYNOPSIS  82 year old female who was found unresponsive at her home on 8/6. Last known normal was evening of 8/5. Patient admitted for suspected bacterial meningitis.    Respiratory -patient intubated in ED for airway protection -continue Full MV support -continue Bronchodilator Therapy -Wean Fio2 and PEEP as tolerated -will perform SAT today, SBT if tolerated   Renal Failure- AKI with lactic acidosis -creatinine increasing: 1.05-->1.30-->1.42-->1.73  -will try 1L fluid bolus -UO 333 over the past 24 hours -hypokalemia resolved: 3.7,  replace with insulin gtt requirement -hypophosphatemia: 1.6, will replace -magnesium normal   Endocrinology: -diabetes mellitus, non-insulin dependent -insulin gtt--> continue -close CBG monitoring with decadron use -hold metformin with AKI   NEUROLOGY -patient off all sedation, minimally responsive -patient grimaced to deep sternal rub today -suspected bacterial meningitis: unsure of clinical utility of LP in setting of antibiotic therapy -MRI, CT head, CT angio negative for acute abnormality.  -EEG with no evidence of epileptiform activity   CARDIAC -ICU monitoring -continue to wean levophed as tolerated -heparin for dvt prophylaxis -echo 8/7 shows mild LVH and EF of 65-70%   ID -continue IV abx as prescribed for suspected bacterial meningitis; ampicillin, ceftriaxone, vancomycin  -acyclovir for HSV coverage  -continue dexamethasone -follow up cultures: no growth x 2 days  -urine culture: no growth -afebrile -leukocytosis trending up: 16.3-->19.5-->28.8  -secondary to infection and steroid use -procalcitonin: <0.10-->0.47-->0.59   DVT: heparin GI PRX: pepcid  TRANSFUSIONS AS NEEDED MONITOR FSBS ASSESS the need for LABS as needed   Critical Care Time devoted to patient care services described in this note is 36 minutes.   Overall, patient is critically ill, prognosis is guarded.  Patient with Multiorgan failure and at high risk for cardiac arrest and death.

## 2018-04-20 NOTE — Progress Notes (Signed)
Pharmacy Antibiotic Note  Natalie Knight is a 82 y.o. female admitted on 04/18/2018 with meningitis.  Pharmacy has been consulted for vancomycin, ceftriaxone and ampicillin dosing. She has a known history which includes anemia chronic disease, dementia, gout, DVT on Eliquis presenting from her apartment, found unresponsive in the bed, patient nonverbal/not following commands/no eye-opening, family member started CPR, EMS brought to the emergency room, patient noted to be comatose then intubated.   Plan: Due to apixaban, patient will be unable to get LP until afternoon on 8/8.  Continue Vancomycin 1g IV Q24hr for goal trough of 15-20. Will obtain vanc random level before this evenings dose since Scr increasing to make sure patient is clearing Vanc.      Continue Ampicillin 2g IV Q6hr.   Continue Acyclovir 700mg  IV Q12hr.   Continue Ceftriaxone 2g IV Q12hr.   Dexamethazone 10mg  IV Q6hr x 4 days.    Height: 5\' 5"  (165.1 cm) Weight: 167 lb 8.8 oz (76 kg) IBW/kg (Calculated) : 57  Temp (24hrs), Avg:98.3 F (36.8 C), Min:97.7 F (36.5 C), Max:98.8 F (37.1 C)  Recent Labs  Lab 04/18/18 1208 04/18/18 1527 04/18/18 1755 04/18/18 2041 04/18/18 2314 04/19/18 0406 04/20/18 0642  WBC 16.3*  --   --   --   --  19.5* 28.8*  CREATININE 1.05*  --   --   --  1.30* 1.42* 1.73*  LATICACIDVEN  --  3.4* 3.2* 3.4*  --   --   --     No Known Allergies  Antimicrobials this admission: Cefepime 8/6 x1  Ampicillin 8/6 >>  Vancomycin 8/6 >>  Ceftriaxone 8/6>> Acyclovir 8/6 >>  Microbiology results: 8/6 BCx: NG x 2 days  8/6 UCx: NG Final  8/6 MRSA PCR: negative   Thank you for allowing pharmacy to be a part of this patient's care.  Gardner CandleSheema M Nakesha Ebrahim, PharmD, BCPS Clinical Pharmacist 04/20/2018 12:37 PM

## 2018-04-20 NOTE — Progress Notes (Signed)
Pharmacy Antibiotic Note  Natalie Knight is a 82 y.o. female admitted on 04/18/2018 with meningitis.  Pharmacy has been consulted for vancomycin, ceftriaxone and ampicillin dosing. She has a known history which includes anemia chronic disease, dementia, gout, DVT on Eliquis presenting from her apartment, found unresponsive in the bed, patient nonverbal/not following commands/no eye-opening, family member started CPR, EMS brought to the emergency room, patient noted to be comatose then intubated. Vancomycin random level = 19 mcg/mL at 1734. 1000mg  dose was administered before dose could be adjusted   Plan: Vancomycin has been held pending the next level. Based on calculated pharmacokinetic parameters based on the level, the level is scheduled for approximately 30 hours after last administered dose.   Continue Ampicillin 2g IV Q6hr.   Continue Acyclovir 700mg  IV Q12hr.   Continue Ceftriaxone 2g IV Q12hr.   Dexamethazone 10mg  IV Q6hr x 4 days.    Height: 5\' 5"  (165.1 cm) Weight: 167 lb 8.8 oz (76 kg) IBW/kg (Calculated) : 57  Temp (24hrs), Avg:98 F (36.7 C), Min:97.1 F (36.2 C), Max:98.8 F (37.1 C)  Recent Labs  Lab 04/18/18 1208 04/18/18 1527 04/18/18 1755 04/18/18 2041 04/18/18 2314 04/19/18 0406 04/20/18 0642 04/20/18 1734  WBC 16.3*  --   --   --   --  19.5* 28.8*  --   CREATININE 1.05*  --   --   --  1.30* 1.42* 1.73*  --   LATICACIDVEN  --  3.4* 3.2* 3.4*  --   --   --   --   VANCORANDOM  --   --   --   --   --   --   --  19    No Known Allergies  Antimicrobials this admission: Cefepime 8/6 x1  Ampicillin 8/6 >>  Vancomycin 8/6 >>  Ceftriaxone 8/6>> Acyclovir 8/6 >>  Microbiology results: 8/6 BCx: NG x 2 days  8/6 UCx: NG Final  8/6 MRSA PCR: negative   Thank you for allowing pharmacy to be a part of this patient's care.  Lowella Bandyodney D Bruno Leach, PharmD Clinical Pharmacist 04/20/2018 8:22 PM

## 2018-04-20 NOTE — Progress Notes (Signed)
PHARMACY NOTE -  RENAL DOSE ADJUSTMENT    Pharmacy to assist with renal dose adjustment.   Patient has been initiated on famotidine 20mg  BID.   SCr 1.73, estimated CrCl 26 ml/min  Current dosage is not  Appropriate. Pharmacy has adjusted the dose to Famotidine 20mg  every 24 hours.   Gardner CandleSheema M Markiya Keefe, PharmD, BCPS Clinical Pharmacist 04/20/2018 9:41 AM

## 2018-04-21 LAB — GLUCOSE, CAPILLARY
GLUCOSE-CAPILLARY: 132 mg/dL — AB (ref 70–99)
GLUCOSE-CAPILLARY: 139 mg/dL — AB (ref 70–99)
GLUCOSE-CAPILLARY: 154 mg/dL — AB (ref 70–99)
GLUCOSE-CAPILLARY: 160 mg/dL — AB (ref 70–99)
GLUCOSE-CAPILLARY: 162 mg/dL — AB (ref 70–99)
GLUCOSE-CAPILLARY: 169 mg/dL — AB (ref 70–99)
GLUCOSE-CAPILLARY: 180 mg/dL — AB (ref 70–99)
GLUCOSE-CAPILLARY: 187 mg/dL — AB (ref 70–99)
GLUCOSE-CAPILLARY: 196 mg/dL — AB (ref 70–99)
Glucose-Capillary: 129 mg/dL — ABNORMAL HIGH (ref 70–99)
Glucose-Capillary: 138 mg/dL — ABNORMAL HIGH (ref 70–99)
Glucose-Capillary: 130 mg/dL — ABNORMAL HIGH (ref 70–99)
Glucose-Capillary: 180 mg/dL — ABNORMAL HIGH (ref 70–99)
Glucose-Capillary: 203 mg/dL — ABNORMAL HIGH (ref 70–99)
Glucose-Capillary: 185 mg/dL — ABNORMAL HIGH (ref 70–99)
Glucose-Capillary: 158 mg/dL — ABNORMAL HIGH (ref 70–99)
Glucose-Capillary: 130 mg/dL — ABNORMAL HIGH (ref 70–99)
Glucose-Capillary: 159 mg/dL — ABNORMAL HIGH (ref 70–99)
Glucose-Capillary: 147 mg/dL — ABNORMAL HIGH (ref 70–99)
Glucose-Capillary: 147 mg/dL — ABNORMAL HIGH (ref 70–99)
Glucose-Capillary: 145 mg/dL — ABNORMAL HIGH (ref 70–99)
Glucose-Capillary: 169 mg/dL — ABNORMAL HIGH (ref 70–99)
Glucose-Capillary: 168 mg/dL — ABNORMAL HIGH (ref 70–99)
Glucose-Capillary: 181 mg/dL — ABNORMAL HIGH (ref 70–99)

## 2018-04-21 LAB — BASIC METABOLIC PANEL
ANION GAP: 8 (ref 5–15)
ANION GAP: 9 (ref 5–15)
BUN: 37 mg/dL — ABNORMAL HIGH (ref 8–23)
BUN: 47 mg/dL — ABNORMAL HIGH (ref 8–23)
CALCIUM: 8.2 mg/dL — AB (ref 8.9–10.3)
CALCIUM: 7.9 mg/dL — AB (ref 8.9–10.3)
CO2: 23 mmol/L (ref 22–32)
CO2: 23 mmol/L (ref 22–32)
CREATININE: 1.49 mg/dL — AB (ref 0.44–1.00)
CREATININE: 1.98 mg/dL — AB (ref 0.44–1.00)
Chloride: 109 mmol/L (ref 98–111)
Chloride: 108 mmol/L (ref 98–111)
GFR calc Af Amer: 26 mL/min — ABNORMAL LOW (ref >60)
GFR, EST AFRICAN AMERICAN: 37 mL/min — AB (ref >60)
GFR, EST NON AFRICAN AMERICAN: 32 mL/min — AB (ref >60)
GFR, EST NON AFRICAN AMERICAN: 23 mL/min — AB (ref >60)
Glucose, Bld: 183 mg/dL — ABNORMAL HIGH (ref 70–99)
Glucose, Bld: 150 mg/dL — ABNORMAL HIGH (ref 70–99)
Potassium: 4.3 mmol/L (ref 3.5–5.1)
Potassium: 4.7 mmol/L (ref 3.5–5.1)
Sodium: 140 mmol/L (ref 135–145)
Sodium: 140 mmol/L (ref 135–145)

## 2018-04-21 LAB — CBC
HCT: 34.3 % — ABNORMAL LOW (ref 35.0–47.0)
HEMOGLOBIN: 11.2 g/dL — AB (ref 12.0–16.0)
MCH: 25.3 pg — AB (ref 26.0–34.0)
MCHC: 32.7 g/dL (ref 32.0–36.0)
MCV: 77.4 fL — ABNORMAL LOW (ref 80.0–100.0)
PLATELETS: 168 10*3/uL (ref 150–440)
RBC: 4.43 MIL/uL (ref 3.80–5.20)
RDW: 15.5 % — ABNORMAL HIGH (ref 11.5–14.5)
WBC: 22.1 10*3/uL — ABNORMAL HIGH (ref 3.6–11.0)

## 2018-04-21 LAB — VANCOMYCIN, TROUGH: VANCOMYCIN TR: 26 ug/mL — AB (ref 15–20)

## 2018-04-21 LAB — APTT: aPTT: 65 seconds — ABNORMAL HIGH (ref 24–36)

## 2018-04-21 LAB — HEPARIN LEVEL (UNFRACTIONATED): HEPARIN UNFRACTIONATED: 0.74 [IU]/mL — AB (ref 0.30–0.70)

## 2018-04-21 LAB — PHOSPHORUS: Phosphorus: 3.1 mg/dL (ref 2.5–4.6)

## 2018-04-21 MED ORDER — VANCOMYCIN HCL IN DEXTROSE 1-5 GM/200ML-% IV SOLN
1000.0000 mg | INTRAVENOUS | Status: DC
  Administered 2018-04-21: 1000 mg via INTRAVENOUS
  Filled 2018-04-21: qty 200

## 2018-04-21 MED ORDER — ALBUTEROL SULFATE (2.5 MG/3ML) 0.083% IN NEBU
2.5000 mg | INHALATION_SOLUTION | Freq: Four times a day (QID) | RESPIRATORY_TRACT | Status: DC
  Administered 2018-04-21 – 2018-04-24 (×13): 2.5 mg via RESPIRATORY_TRACT
  Filled 2018-04-21 (×12): qty 3

## 2018-04-21 MED ORDER — ALBUTEROL SULFATE (2.5 MG/3ML) 0.083% IN NEBU: 2.5000 mg | INHALATION_SOLUTION | Freq: Four times a day (QID) | RESPIRATORY_TRACT | Status: DC

## 2018-04-21 MED ORDER — SODIUM CHLORIDE 0.9 % IV BOLUS
1000.0000 mL | Freq: Once | INTRAVENOUS | Status: AC
  Administered 2018-04-21: 1000 mL via INTRAVENOUS

## 2018-04-21 MED ORDER — DEXAMETHASONE SODIUM PHOSPHATE 10 MG/ML IJ SOLN
6.0000 mg | Freq: Three times a day (TID) | INTRAMUSCULAR | Status: DC
  Administered 2018-04-21 – 2018-04-22 (×3): 6 mg via INTRAVENOUS
  Filled 2018-04-21 (×5): qty 0.6

## 2018-04-21 MED ORDER — FREE WATER
30.0000 mL | Status: DC
  Administered 2018-04-21 – 2018-04-22 (×21): 30 mL

## 2018-04-21 NOTE — Progress Notes (Signed)
[place] for heparin drip management  Indication: DVT  No Known Allergies  Patient Measurements: Height: '5\' 5"'  (165.1 cm) Weight: 176 lb 2.4 oz (79.9 kg) IBW/kg (Calculated) : 57   Vital Signs: Temp: 98.5 F (36.9 C) (08/09 0400) Temp Source: Axillary (08/09 0400) BP: 120/72 (08/09 0500) Pulse Rate: 76 (08/09 0500)  Labs: Recent Labs    04/18/18 1208 04/18/18 1419  04/18/18 2044  04/19/18 0349 04/19/18 0406 04/19/18 0600  04/20/18 0642 04/20/18 1601 04/21/18 0411  HGB 13.5  --   --   --   --   --  11.8*  --   --  11.3*  --  11.2*  HCT 42.2  --   --   --   --   --  37.0  --   --  34.8*  --  34.3*  PLT 200  --   --   --   --   --  191  --   --  177  --  168  APTT  --   --    < >  --   --   --   --  >160*   < > 116* 56* 65*  LABPROT  --  13.6  --   --   --   --   --   --   --   --   --   --   INR  --  1.05  --   --   --   --   --   --   --   --   --   --   HEPARINUNFRC  --   --   --   --    < >  --   --   --   --  1.11* 0.64 0.74*  CREATININE 1.05*  --   --   --    < >  --  1.42*  --   --  1.73*  --  1.49*  CKTOTAL 161  --   --   --   --   --   --   --   --   --   --   --   TROPONINI 0.03*  --    < > 0.06*  --  0.28*  --  0.21*  --   --   --   --    < > = values in this interval not displayed.    Estimated Creatinine Clearance: 32 mL/min (A) (by C-G formula based on SCr of 1.49 mg/dL (H)).   Medical History: Past Medical History:  Diagnosis Date  . Diabetes mellitus without complication ([place])   . Hypertension     Medications:  Scheduled:  . albuterol  2.5 mg Nebulization Q4H  . chlorhexidine gluconate (MEDLINE KIT)  15 mL Mouth Rinse BID  . dexamethasone  10 mg Intravenous Q6H  . feeding supplement (PRO-STAT SUGAR FREE 64)  30 mL Per Tube Daily  . mouth rinse  15 mL Mouth Rinse 10 times per day  . senna-docusate  1 tablet Per Tube BID   Infusions:  . sodium chloride    . acyclovir Stopped (04/20/18 2252)  .  ampicillin (OMNIPEN) IV Stopped (04/21/18 0045)  . cefTRIAXone (ROCEPHIN)  IV 2 g (04/21/18 0511)  . famotidine (PEPCID) IV Stopped (04/20/18 2217)  . feeding supplement (VITAL 1.5 CAL) 50 mL/hr at 04/21/18 0500  . fentaNYL infusion INTRAVENOUS 100 mcg/hr (04/21/18 0500)  .  heparin 600 Units/hr (04/21/18 0500)  . insulin (NOVOLIN-R) infusion 4.6 Units/hr (04/21/18 0505)  . lactated ringers with kcl 75 mL/hr at 04/21/18 0500  . norepinephrine (LEVOPHED) 6m / 254minfusion 4.053 mcg/min (04/21/18 0500)    Assessment: Pharmacy consulted for heparin drip management for [age] female admitted with meningitis and requiring mechanical ventilation. Patient previously on apixaban for left leg DVT in 02/2018. Heparin currently infusing at 550units/hr.  Goal of Therapy:  Heparin level 0.3-0.7 units/ml aPTT 66-102 seconds Monitor platelets by anticoagulation protocol: Yes   Plan:  8/8 AM aPTT slightly subratherapeutic with a therapeutic HL. Hgb/Hct/Plts remains stable. Will increase the infusion to 600units/hr and recheck aPTT and HL in 8 hours.   8/9 AM aPTT 65, heparin level 0.74. Continue current regimen. Recheck aPTT, heparin level, and CBC with tomorrow AM labs.  Pharmacy will continue to monitor and adjust per consult.   McEloise HarmanPharmD Clinical Pharmacist 04/21/2018 5:46 AM

## 2018-04-21 NOTE — Progress Notes (Signed)
[employer] - [place] at [facility]   PATIENT NAME: Natalie Knight    MR#:  [id number]  DATE OF BIRTH:  11-Apr-1936  SUBJECTIVE:  CHIEF COMPLAINT:   Chief Complaint  Patient presents with  . Altered Mental Status   Patient is intubated, on ventilation, on Levophed drip and insulin drip. REVIEW OF SYSTEMS:  Review of Systems  Unable to perform ROS: Intubated    DRUG ALLERGIES:  No Known Allergies VITALS:  Blood pressure (!) 113/55, pulse 79, temperature 97.6 F (36.4 C), temperature source Axillary, resp. rate 12, height 5\' 5"  (1.651 m), weight 79.9 kg, SpO2 99 %. PHYSICAL EXAMINATION:  Physical Exam  HENT:  Head: Normocephalic.  Eyes: No scleral icterus.  Bilateral pupils are pinpoint, not reactive to light.  Neck: Neck supple. No JVD present. No tracheal deviation present.  Cardiovascular: Normal rate, regular rhythm and normal heart sounds. Exam reveals no gallop.  No murmur heard. Pulmonary/Chest: Breath sounds normal. No respiratory distress. She has no wheezes. She has no rales.  Abdominal: Soft. Bowel sounds are normal. She exhibits no distension. There is no tenderness. There is no rebound.  Musculoskeletal: She exhibits no edema or tenderness.  Neurological:  Unable to exam.  Skin: No rash noted. No erythema.   LABORATORY PANEL:  Female CBC Recent Labs  Lab 04/21/18 0411  WBC 22.1*  HGB 11.2*  HCT 34.3*  PLT 168   ------------------------------------------------------------------------------------------------------------------ Chemistries  Recent Labs  Lab 04/18/18 1208  04/20/18 0642 04/21/18 0411  NA 139   < > 137 140  K 3.3*   < > 3.7 4.3  CL 98   < > 103 109  CO2 25   < > 20* 23  GLUCOSE 253*   < > 327* 183*  BUN 15   < > 36* 37*  CREATININE 1.05*   < > 1.73* 1.49*  CALCIUM 9.7   < > 8.3* 8.2*  MG  --    < > 2.3  --   AST 37  --   --   --   ALT 22  --   --   --   ALKPHOS 46  --   --   --   BILITOT 1.1  --   --   --    < > = values in this interval not displayed.   RADIOLOGY:  No results found. ASSESSMENT AND PLAN:   Acute respiratory failure with hypoxia. Continue full and with support.  Continue bronchodilator therapy.  *Acute sepsis Suspected due to acute bacterial meningitis Continue ampicillin, Rocephin, Vanco and acyclovir, dexamethasone IV,  MRI of the brain- no acute findings- and LP may help, to be decided by Intencivist.  Follow-up CBC and blood cultures, neurochecks per routine, IV fluids for rehydration.  *Acute comatose state Most likely secondary to above, MRI and EEG done. Avoid psychotropic meds, neurochecks per routine, aspiration/fall precautions, ventilator with weaning as tolerated. negative RPR,  Appreciated Neurology help.  * Ac renal fialure   Likely due to Hypoperfusion secondary to hypotension, IV fluids, monitor.  * Hypotension.  Continue Levophed drip.  * Elevated troponin, possible due to demanding ischemia secondary to above.  * Lactic acidosis.  Due to above.  Follow-up level.  *Chronic diabetes mellitus type 2 On insulin drip.  *Chronic benign essential hypertension Hold hypertension medication due to hypotension.  *Acute hypokalemia Repleted with IV potassium, improved.  *History of DVT Hold Eliquis for now, SCDs- heparine drip.  All the records are reviewed  and case discussed with Care Management/Social Worker. Management plans discussed with the patient's daughter, and they are in agreement.  CODE STATUS: DNR  TOTAL TIME TAKING CARE OF THIS PATIENT: 36 minutes.    POSSIBLE D/C IN ? DAYS, DEPENDING ON CLINICAL CONDITION.   Altamese Dilling M.D on 04/21/2018 at 9:23 AM  Between 7am to 6pm - Pager - [phone]  After 6pm go to [email or web address] - Therapist, nutritional Hospitalists

## 2018-04-21 NOTE — Progress Notes (Signed)
Inpatient Diabetes Program Recommendations  AACE/ADA: New Consensus Statement on Inpatient Glycemic Control (2015)  Target Ranges:  Prepandial:   less than 140 mg/dL      Peak postprandial:   less than 180 mg/dL (1-2 hours)      Critically ill patients:  140 - 180 mg/dL   Results for Natalie Knight, Natalie Knight (MRN [id number]) as of 04/21/2018 08:10  Ref. Range 04/21/2018 00:06 04/21/2018 01:04 04/21/2018 01:57 04/21/2018 02:54 04/21/2018 04:19 04/21/2018 05:04 04/21/2018 05:57 04/21/2018 07:03 04/21/2018 08:06  Glucose-Capillary Latest Ref Range: 70 - 99 mg/dL 409160 (H) 811129 (H) 914138 (H) 130 (H) 180 (H) 196 (H) 180 (H) 203 (H) 185 (H)     Home DM Meds: Metformin 1000 mg AM/ 500 mg PM       Novolog (Has not started at home yet)  Current Orders: IV Insulin Drip (per Phase 2 of the ICU Glycemic Control Protocol)      Patient currently Intubated.    Vital 1.5 CAL tube feeds running at goal rate of 50cc/hr.  Patient getting Decadron 10 mg Q6 hours.      MD- Please keep patient on the IV Insulin drip for now.   Needs to meet the following criteria (based on transition criteria on the ICU Glycemic Control Protocol) before transitioning to SQ Insulin:  1. Tube feeds at goal rate--Yes  2. Insulin Drip rate less than 4 units/hour-- NOT yet  3. CBGs <180 mg/dl for 6 consecutive readings-- NOT yet    --Will follow patient during hospitalization--  Ambrose FinlandJeannine Johnston Marlo Arriola RN, MSN, CDE Diabetes Coordinator Inpatient Glycemic Control Team Team Pager: [phone] (8a-5p)

## 2018-04-21 NOTE — Care Management (Signed)
Patient currently intubated and team will attempt LP.

## 2018-04-21 NOTE — Progress Notes (Signed)
Subjective: Patient remains intubated and sedated.  Unresponsive.  Objective: Current vital signs: BP (!) 103/55   Pulse 82   Temp 97.9 F (36.6 C) (Axillary)   Resp 14   Ht '5\' 5"'  (1.651 m)   Wt 79.9 kg   SpO2 100%   BMI 29.31 kg/m  Vital signs in last 24 hours: Temp:  [97.1 F (36.2 C)-98.5 F (36.9 C)] 97.9 F (36.6 C) (08/09 1600) Pulse Rate:  [71-87] 82 (08/09 1715) Resp:  [6-19] 14 (08/09 1715) BP: (79-129)/(44-80) 103/55 (08/09 1715) SpO2:  [96 %-100 %] 100 % (08/09 1715) FiO2 (%):  [28 %] 28 % (08/09 1515) Weight:  [79.9 kg] 79.9 kg (08/09 0432)  Intake/Output from previous day: 08/08 0701 - 08/09 0700 In: 5039.9 [I.V.:2491.3; NG/GT:1380; IV Piggyback:1138.6] Out: 610 [Urine:610] Intake/Output this shift: Total I/O In: 2138.5 [I.V.:972.8; NG/GT:580; IV Piggyback:585.7] Out: 45 [Urine:45] Nutritional status:  Diet Order            Diet NPO time specified  Diet effective now              Neurologic Exam: Mental Status: Patient does not respond to verbal stimuli.  Grimaces and attempts to localize to deep sternal rub.  Does not follow commands.  No verbalizations noted.  Cranial Nerves: II: patient does not respond confrontation bilaterally, pupils right 2 mm, left 2 mm,and reactive bilaterally III,IV,VI: doll's response present bilaterally. V,VII: corneal reflex present bilaterally  VIII: patient does not respond to verbal stimuli IX,X: gag reflex reduced, XI: trapezius strength unable to test bilaterally XII: tongue strength unable to test Motor: Moves both upper extremities Sensory: Grimaces to noxious stimuli in all extremities   Lab Results: Basic Metabolic Panel: Recent Labs  Lab 04/18/18 1208 04/18/18 1527 04/18/18 2314 04/19/18 0406 04/19/18 0600 04/20/18 0642 04/21/18 0411  NA 139  --  139 139  --  137 140  K 3.3*  --  3.3* 3.5  --  3.7 4.3  CL 98  --  104 103  --  103 109  CO2 25  --  25 22  --  20* 23  GLUCOSE 253*  --  153*  244*  --  327* 183*  BUN 15  --  17 20  --  36* 37*  CREATININE 1.05*  --  1.30* 1.42*  --  1.73* 1.49*  CALCIUM 9.7  --  8.3* 8.4*  --  8.3* 8.2*  MG  --  0.9* 2.6*  --  2.5* 2.3  --   PHOS  --  2.7  --   --   --  1.6* 3.1    Liver Function Tests: Recent Labs  Lab 04/18/18 1208  AST 37  ALT 22  ALKPHOS 46  BILITOT 1.1  PROT 7.3  ALBUMIN 4.0   Recent Labs  Lab 04/18/18 1208  LIPASE 17   Recent Labs  Lab 04/18/18 1755  AMMONIA 12    CBC: Recent Labs  Lab 04/18/18 1208 04/19/18 0406 04/20/18 0642 04/21/18 0411  WBC 16.3* 19.5* 28.8* 22.1*  NEUTROABS 15.1*  --   --   --   HGB 13.5 11.8* 11.3* 11.2*  HCT 42.2 37.0 34.8* 34.3*  MCV 77.8* 77.1* 77.3* 77.4*  PLT 200 191 177 168    Cardiac Enzymes: Recent Labs  Lab 04/18/18 1208 04/18/18 1755 04/18/18 2044 04/19/18 0349 04/19/18 0600  CKTOTAL 161  --   --   --   --   TROPONINI 0.03* 0.06* 0.06* 0.28* 0.21*  Lipid Panel: No results for input(s): CHOL, TRIG, HDL, CHOLHDL, VLDL, LDLCALC in the last 168 hours.  CBG: Recent Labs  Lab 04/21/18 1306 04/21/18 1402 04/21/18 1503 04/21/18 1602 04/21/18 1702  GLUCAP 159* 187* 147* 147* 139*    Microbiology: Results for orders placed or performed during the hospital encounter of 04/18/18  Urine culture     Status: None   Collection Time: 04/18/18 12:08 PM  Result Value Ref Range Status   Specimen Description   Final    URINE, RANDOM Performed at [facility], [address], [place], [place] [address]    Special Requests   Final    NONE Performed at [facility], [address], [place], [place] [address]    Culture   Final    NO GROWTH Performed at [facility], [place] [address] [place], [place] [address]    Report Status 04/19/2018 FINAL  Final  Blood Culture (routine x 2)     Status: None (Preliminary result)   Collection Time: 04/18/18  1:12 PM  Result Value Ref Range Status   Specimen Description BLOOD RIGHT HAND   Final   Special Requests   Final    BOTTLES DRAWN AEROBIC AND ANAEROBIC Blood Culture results may not be optimal due to an inadequate volume of blood received in culture bottles   Culture   Final    NO GROWTH 3 DAYS Performed at [facility], [address], [place], [place] [address]    Report Status PENDING  Incomplete  Blood Culture (routine x 2)     Status: None (Preliminary result)   Collection Time: 04/18/18  1:20 PM  Result Value Ref Range Status   Specimen Description BLOOD RIGHT [facility]  Final   Special Requests   Final    BOTTLES DRAWN AEROBIC AND ANAEROBIC Blood Culture adequate volume   Culture   Final    NO GROWTH 3 DAYS Performed at [facility], [address], [place], [place] [address]    Report Status PENDING  Incomplete  MRSA PCR Screening     Status: None   Collection Time: 04/18/18  5:19 PM  Result Value Ref Range Status   MRSA by PCR NEGATIVE NEGATIVE Final    Comment:        The GeneXpert MRSA Assay (FDA approved for NASAL specimens only), is one component of a comprehensive MRSA colonization surveillance program. It is not intended to diagnose MRSA infection nor to guide or monitor treatment for MRSA infections. Performed at [facility], [address] [place], [place] [address]     Coagulation Studies: No results for input(s): LABPROT, INR in the last 72 hours.  Imaging: No results found.  Medications:  I have reviewed the patient's current medications. Scheduled: . albuterol  2.5 mg Nebulization Q6H  . chlorhexidine gluconate (MEDLINE KIT)  15 mL Mouth Rinse BID  . dexamethasone  6 mg Intravenous Q8H  . feeding supplement (PRO-STAT SUGAR FREE 64)  30 mL Per Tube Daily  . free water  30 mL Per Tube Q1H  . mouth rinse  15 mL Mouth Rinse 10 times per day  . senna-docusate  1 tablet Per Tube BID    Assessment/Plan: Patient intubated and sedated.  Remains afebrile.  White blood cell count starting to respond.   Remains on antibiotics and Acyclovir.  With patient showing response to antibiotics, LP not indicated at this time.    Will continue to follow with you.  LOS: 3 days   Alexis Goodell, MD Neurology [phone] 04/21/2018  5:37 PM

## 2018-04-21 NOTE — Progress Notes (Signed)
[employer] - [place] at [facility]   PATIENT NAME: Natalie Knight    MR#:  [id number]  DATE OF BIRTH:  [birth year]  SUBJECTIVE:  CHIEF COMPLAINT:   Chief Complaint  Patient presents with  . Altered Mental Status   Patient is intubated, on ventilation, off Levophed drip  REVIEW OF SYSTEMS:  Review of Systems  Unable to perform ROS: Intubated    DRUG ALLERGIES:  No Known Allergies VITALS:  Blood pressure 114/70, pulse 84, temperature 97.9 F (36.6 C), temperature source Axillary, resp. rate 16, height 5\' 5"  (1.651 m), weight 79.9 kg, SpO2 98 %. PHYSICAL EXAMINATION:  Physical Exam  HENT:  Head: Normocephalic.  Eyes: No scleral icterus.  Bilateral pupils are pinpoint, not reactive to light.  Neck: Neck supple. No JVD present. No tracheal deviation present.  Cardiovascular: Normal rate, regular rhythm and normal heart sounds. Exam reveals no gallop.  No murmur heard. Pulmonary/Chest: Breath sounds normal. No respiratory distress. She has no wheezes. She has no rales.  Abdominal: Soft. Bowel sounds are normal. She exhibits no distension. There is no tenderness. There is no rebound.  Musculoskeletal: She exhibits no edema or tenderness.  Neurological:  Unable to exam.  Skin: No rash noted. No erythema.   LABORATORY PANEL:  Female CBC Recent Labs  Lab 04/21/18 0411  WBC 22.1*  HGB 11.2*  HCT 34.3*  PLT 168   ------------------------------------------------------------------------------------------------------------------ Chemistries  Recent Labs  Lab 04/18/18 1208  04/20/18 0642  04/21/18 1820  NA 139   < > 137   < > 140  K 3.3*   < > 3.7   < > 4.7  CL 98   < > 103   < > 108  CO2 25   < > 20*   < > 23  GLUCOSE 253*   < > 327*   < > 150*  BUN 15   < > 36*   < > 47*  CREATININE 1.05*   < > 1.73*   < > 1.98*  CALCIUM 9.7   < > 8.3*   < > 7.9*  MG  --    < > 2.3  --   --   AST 37  --   --   --   --   ALT 22  --   --   --   --   ALKPHOS 46  --    --   --   --   BILITOT 1.1  --   --   --   --    < > = values in this interval not displayed.   RADIOLOGY:  No results found. ASSESSMENT AND PLAN:   Acute respiratory failure with hypoxia. Continue full and with support.  Continue bronchodilator therapy.  *Acute sepsis Suspected due to acute bacterial meningitis Continue ampicillin, Rocephin, Vanco and acyclovir, dexamethasone IV,  MRI of the brain- no acute findings- and LP may help, to be decided by Intencivist.  Follow-up CBC and blood cultures, neurochecks per routine, IV fluids for rehydration. MRI brain negative.  *Acute comatose state Most likely secondary to above, MRI and EEG done. Avoid psychotropic meds, neurochecks per routine, aspiration/fall precautions, ventilator with weaning as tolerated. negative RPR,  Appreciated Neurology help.  * Ac renal fialure   Likely due to Hypoperfusion secondary to hypotension, IV fluids, monitor.   Very little output and worsening.  * Hypotension.  stopped Levophed drip.    Improved.  * Elevated troponin, possible due to demanding  ischemia secondary to above.  * Lactic acidosis.  Due to above.  Follow-up level.  *Chronic diabetes mellitus type 2 On insulin drip.  *Chronic benign essential hypertension Hold hypertension medication due to hypotension.  *Acute hypokalemia Repleted with IV potassium, improved.  *History of DVT Hold Eliquis for now, SCDs- heparine drip.  All the records are reviewed and case discussed with Care Management/Social Worker. Management plans discussed with the patient's daughter, and they are in agreement.  CODE STATUS: DNR  TOTAL TIME TAKING CARE OF THIS PATIENT: 36 minutes.    POSSIBLE D/C IN ? DAYS, DEPENDING ON CLINICAL CONDITION.   Altamese DillingVaibhavkumar Sherald Balbuena M.D on 04/21/2018 at 8:08 PM  Between 7am to 6pm - Pager - [phone]  After 6pm go to [email or web address] - Therapist, nutritionalpassword [email or web address]  [employer] [place] Hospital[facility]

## 2018-04-21 NOTE — Progress Notes (Signed)
CRITICAL CARE NOTE  CC  follow up respiratory failure secondary to suspected bacterial meningitis  SUBJECTIVE Patient remains critically ill Prognosis is guarded  82 year old female who was found unresponsive at her home on 8/6. Admitted with suspected bacterial meningitis. CT head, MRI brain, and EEG negative. Patient continues to be afebrile.   SIGNIFICANT EVENTS Patient initially unresponsive with no sedation. Yesterday, patient becoming agitated with need for sedation. This morning, patient requiring sedation with fentanyl. She is not performing any purposeful movements.    BP (!) 108/50   Pulse 80   Temp 97.6 F (36.4 C) (Axillary)   Resp 17   Ht '5\' 5"'  (1.651 m)   Wt 79.9 kg   SpO2 100%   BMI 29.31 kg/m    REVIEW OF SYSTEMS  PATIENT IS UNABLE TO PROVIDE COMPLETE REVIEW OF SYSTEM S DUE TO SEVERE CRITICAL ILLNESS AND ENCEPHALOPATHY   PHYSICAL EXAMINATION:  GENERAL:critically ill appearing, +resp distress HEAD: Normocephalic, atraumatic.  EYES: Pupils equal, round, reactive to light.  No scleral icterus.  MOUTH: Moist mucosal membrane. NECK: Nuchal rigidity. No thyromegaly. No nodules. No JVD.  PULMONARY: clear to auscultation bilaterally, no wheezes, rales, or rhonchi CARDIOVASCULAR: S1 and S2. Regular rate and rhythm. No murmurs, rubs, or gallops. 2+ dorsalis pedis pulses bilaterally GASTROINTESTINAL: Soft, nontender, -distended. No masses. Positive bowel sounds. No hepatosplenomegaly.  MUSCULOSKELETAL: No swelling, clubbing, or edema.  NEUROLOGIC: obtunded, GCS<8 SKIN:intact,warm,dry  ASSESSMENT AND PLAN SYNOPSIS  82 year old female who was found responsive at her home on 8/6. Last known normal was evening of 8/5. Patient admitted for suspected bacterial meningitis.    Respiratory  -patient intubated in ED for airway protection -continue Full MV support -continue Bronchodilator Therapy -Wean Fio2 and PEEP as tolerated: Fi02 28, PEEP of 5 -will perform  SAT/SBt when respiratory parameters are met   Renal Failure -AKI -creatinine: 1.05-->1.30-->1.42-->1.73-->1.49 -electrolytes stable, continue to monitor -urine output continues to be poor, however improved with fluid bolus yesterday   NEUROLOGY -acute encephalopathy secondary to suspected bacterial meningitis - intubated and sedated: fentanyl -increase sedation to differentiate between nuchal rigidity vs. Neck strain from decreased comfort - minimal sedation to achieve a RASS goal: -1 -no clinical utility for LP at this time as patient has been on abx for 72 hours. -patient with clinical improvement with current treatment regimen -appreciate neurology input   CARDIAC -ICU monitoring -may need to restart levophed for pressure support due to increased sedation  -MAP goal of >65 -echo shows EF of 65-70% for increased right heart pressure. Will monitor closely for fluid overload  Endocrinology: -diabetes mellitus, non-insulin dependent -insulin gtt may be able to be discontinue with decrease in decadron dose -close CBG monitoring -hold metformin  ID -continue IV abx as prescribed: ampicillin, ceftriaxone, vancomycin  -continue acyclovir for HSV coverage  -continue dexamethasone for bacterial meningitis: decreased to 69m q8h -follow up cultures  -blood cultures negative x 3 days   DVT: heparin GI PRX: pecpid -continue tube feeds   TRANSFUSIONS AS NEEDED MONITOR FSBS ASSESS the need for LABS as needed   Critical Care Time devoted to patient care services described in this note is 32 minutes.   Overall, patient is critically ill, prognosis is guarded.  Patient with Multiorgan failure and at high risk for cardiac arrest and death.

## 2018-04-21 NOTE — Progress Notes (Signed)
Pharmacy Electrolyte Monitoring Consult:  Pharmacy consulted to assist in monitoring and replacing electrolytes in this 82 y.o. female admitted on 04/18/2018 with Altered Mental Status  Patient is on insulin drip.   MIVF: LR/1920mEq of Potassium @ 475mL/hr.   Labs:  Sodium (mmol/L)  Date Value  04/21/2018 140  10/03/2014 142   Potassium (mmol/L)  Date Value  04/21/2018 4.3  10/03/2014 3.6   Magnesium (mg/dL)  Date Value  96/04/540908/04/2018 2.3   Phosphorus (mg/dL)  Date Value  81/19/147808/05/2018 3.1   Calcium (mg/dL)  Date Value  29/56/213008/05/2018 8.2 (L)   Calcium, Total (mg/dL)  Date Value  86/57/846901/21/2016 9.3   Albumin (g/dL)  Date Value  62/95/284108/02/2018 4.0  10/01/2014 2.6 (L)    Assessment/Plan: 8/9 AM No additional electrolyte replacement needed at this time.   Labs ordered for F/U on 8/10  Pharmacy will continue to monitor and adjust per consult.   Gardner CandleSheema M Montey Ebel, PharmD, BCPS Clinical Pharmacist 04/21/2018 9:26 AM

## 2018-04-21 NOTE — Progress Notes (Signed)
Pharmacy Antibiotic Note  Natalie Knight is a 82 y.o. female admitted on 04/18/2018 with meningitis.  Pharmacy has been consulted for vancomycin, ceftriaxone and ampicillin dosing. She has a known history which includes anemia chronic disease, dementia, gout, DVT on Eliquis presenting from her apartment, found unresponsive in the bed, patient nonverbal/not following commands/no eye-opening, family member started CPR, EMS brought to the emergency room, patient noted to be comatose then intubated.   Plan: Due to apixaban, patient will be unable to get LP. Plan was to get LP on 8/8.  Continue Vancomycin 1g IV Q24hr for goal trough of 15-20. Vanc trough ordered for 1930 today.       Continue Ampicillin 2g IV Q6hr.   Continue Acyclovir 700mg  IV Q12hr (For CrCl 25-2550mL/min).   Continue Ceftriaxone 2g IV Q12hr.   Dexamethazone 10mg  IV Q6hr x 4 days. Stop date 8/10.    Height: 5\' 5"  (165.1 cm) Weight: 176 lb 2.4 oz (79.9 kg) IBW/kg (Calculated) : 57  Temp (24hrs), Avg:97.8 F (36.6 C), Min:97.1 F (36.2 C), Max:98.5 F (36.9 C)  Recent Labs  Lab 04/18/18 1208 04/18/18 1527 04/18/18 1755 04/18/18 2041 04/18/18 2314 04/19/18 0406 04/20/18 0642 04/20/18 1734 04/21/18 0411  WBC 16.3*  --   --   --   --  19.5* 28.8*  --  22.1*  CREATININE 1.05*  --   --   --  1.30* 1.42* 1.73*  --  1.49*  LATICACIDVEN  --  3.4* 3.2* 3.4*  --   --   --   --   --   VANCORANDOM  --   --   --   --   --   --   --  19  --     No Known Allergies  Antimicrobials this admission: Cefepime 8/6 x1  Ampicillin 8/6 >>  Vancomycin 8/6 >>  Ceftriaxone 8/6>> Acyclovir 8/6 >>  Microbiology results: 8/6 BCx: NG x 2 days  8/6 UCx: NG Final  8/6 MRSA PCR: negative   Thank you for allowing pharmacy to be a part of this patient's care.  Gardner CandleSheema M Brynne Doane, PharmD, BCPS Clinical Pharmacist 04/21/2018 9:24 AM

## 2018-04-21 NOTE — Progress Notes (Signed)
Pharmacy Antibiotic Note  Natalie Knight is a 82 y.o. female admitted on 04/18/2018 with meningitis.  Pharmacy has been consulted for vancomycin, ceftriaxone and ampicillin dosing. She has a known history which includes anemia chronic disease, dementia, gout, DVT on Eliquis presenting from her apartment, found unresponsive in the bed, patient nonverbal/not following commands/no eye-opening, family member started CPR, EMS brought to the emergency room, patient noted to be comatose then intubated. Her first Vt = 7926mcg/mL drawn at 1936. The nurse had already begun infusing the 1000mg  dose when the level resulted. She estimates that approximately half of the bag had been infused   Plan: Hold vancomycin for now. We will draw a f/u level 04/22/18 at 2000 and adjust dose based on that level   Height: 5\' 5"  (165.1 cm) Weight: 176 lb 2.4 oz (79.9 kg) IBW/kg (Calculated) : 57  Temp (24hrs), Avg:98 F (36.7 C), Min:97.6 F (36.4 C), Max:98.5 F (36.9 C)  Recent Labs  Lab 04/18/18 1208 04/18/18 1527 04/18/18 1755 04/18/18 2041 04/18/18 2314 04/19/18 0406 04/20/18 0642 04/20/18 1734 04/21/18 0411 04/21/18 1820 04/21/18 1936  WBC 16.3*  --   --   --   --  19.5* 28.8*  --  22.1*  --   --   CREATININE 1.05*  --   --   --  1.30* 1.42* 1.73*  --  1.49* 1.98*  --   LATICACIDVEN  --  3.4* 3.2* 3.4*  --   --   --   --   --   --   --   VANCOTROUGH  --   --   --   --   --   --   --   --   --   --  3526*  VANCORANDOM  --   --   --   --   --   --   --  19  --   --   --     No Known Allergies  Antimicrobials this admission: Cefepime 8/6 x1  Ampicillin 8/6 >>  Vancomycin 8/6 >>  Ceftriaxone 8/6>> Acyclovir 8/6 >>  Microbiology results: 8/6 BCx: NG x 2 days  8/6 UCx: NG Final  8/6 MRSA PCR: negative   Thank you for allowing pharmacy to be a part of this patient's care.  Lowella Bandyodney D Jakyra Kenealy, PharmD Clinical Pharmacist 04/21/2018 8:30 PM

## 2018-04-22 ENCOUNTER — Inpatient Hospital Stay: Payer: Medicare HMO

## 2018-04-22 LAB — BASIC METABOLIC PANEL
Anion gap: 11 (ref 5–15)
BUN: 51 mg/dL — AB (ref 8–23)
CO2: 21 mmol/L — AB (ref 22–32)
CREATININE: 2.34 mg/dL — AB (ref 0.44–1.00)
Calcium: 7.7 mg/dL — ABNORMAL LOW (ref 8.9–10.3)
Chloride: 108 mmol/L (ref 98–111)
GFR calc Af Amer: 22 mL/min — ABNORMAL LOW (ref >60)
GFR calc non Af Amer: 19 mL/min — ABNORMAL LOW (ref >60)
GLUCOSE: 153 mg/dL — AB (ref 70–99)
POTASSIUM: 4.8 mmol/L (ref 3.5–5.1)
SODIUM: 140 mmol/L (ref 135–145)

## 2018-04-22 LAB — APTT
APTT: 64 s — AB (ref 24–36)
aPTT: 42 seconds — ABNORMAL HIGH (ref 24–36)
aPTT: 52 seconds — ABNORMAL HIGH (ref 24–36)

## 2018-04-22 LAB — CBC
HCT: 32.6 % — ABNORMAL LOW (ref 35.0–47.0)
Hemoglobin: 10.6 g/dL — ABNORMAL LOW (ref 12.0–16.0)
MCH: 25.3 pg — AB (ref 26.0–34.0)
MCHC: 32.4 g/dL (ref 32.0–36.0)
MCV: 78.1 fL — AB (ref 80.0–100.0)
PLATELETS: 138 10*3/uL — AB (ref 150–440)
RBC: 4.18 MIL/uL (ref 3.80–5.20)
RDW: 15.6 % — AB (ref 11.5–14.5)
WBC: 13.2 10*3/uL — AB (ref 3.6–11.0)

## 2018-04-22 LAB — GLUCOSE, CAPILLARY
GLUCOSE-CAPILLARY: 140 mg/dL — AB (ref 70–99)
GLUCOSE-CAPILLARY: 145 mg/dL — AB (ref 70–99)
GLUCOSE-CAPILLARY: 148 mg/dL — AB (ref 70–99)
GLUCOSE-CAPILLARY: 152 mg/dL — AB (ref 70–99)
GLUCOSE-CAPILLARY: 152 mg/dL — AB (ref 70–99)
GLUCOSE-CAPILLARY: 169 mg/dL — AB (ref 70–99)
Glucose-Capillary: 158 mg/dL — ABNORMAL HIGH (ref 70–99)
Glucose-Capillary: 140 mg/dL — ABNORMAL HIGH (ref 70–99)
Glucose-Capillary: 140 mg/dL — ABNORMAL HIGH (ref 70–99)
Glucose-Capillary: 152 mg/dL — ABNORMAL HIGH (ref 70–99)
Glucose-Capillary: 148 mg/dL — ABNORMAL HIGH (ref 70–99)
Glucose-Capillary: 152 mg/dL — ABNORMAL HIGH (ref 70–99)
Glucose-Capillary: 127 mg/dL — ABNORMAL HIGH (ref 70–99)
Glucose-Capillary: 151 mg/dL — ABNORMAL HIGH (ref 70–99)
Glucose-Capillary: 150 mg/dL — ABNORMAL HIGH (ref 70–99)
Glucose-Capillary: 147 mg/dL — ABNORMAL HIGH (ref 70–99)
Glucose-Capillary: 113 mg/dL — ABNORMAL HIGH (ref 70–99)
Glucose-Capillary: 116 mg/dL — ABNORMAL HIGH (ref 70–99)
Glucose-Capillary: 130 mg/dL — ABNORMAL HIGH (ref 70–99)

## 2018-04-22 LAB — MAGNESIUM: Magnesium: 2.2 mg/dL (ref 1.7–2.4)

## 2018-04-22 LAB — HEPARIN LEVEL (UNFRACTIONATED): Heparin Unfractionated: 0.51 IU/mL (ref 0.30–0.70)

## 2018-04-22 LAB — PHOSPHORUS: Phosphorus: 4.1 mg/dL (ref 2.5–4.6)

## 2018-04-22 MED ORDER — VANCOMYCIN VARIABLE DOSE PER UNSTABLE RENAL FUNCTION (PHARMACIST DOSING): Status: DC

## 2018-04-22 MED ORDER — DIPHENHYDRAMINE HCL 50 MG/ML IJ SOLN
50.0000 mg | Freq: Once | INTRAMUSCULAR | Status: AC
  Administered 2018-04-22: 50 mg via INTRAVENOUS
  Filled 2018-04-22: qty 1

## 2018-04-22 MED ORDER — INSULIN ASPART 100 UNIT/ML ~~LOC~~ SOLN
0.0000 [IU] | SUBCUTANEOUS | Status: DC
  Administered 2018-04-22 – 2018-04-23 (×5): 1 [IU] via SUBCUTANEOUS
  Administered 2018-04-24 (×2): 2 [IU] via SUBCUTANEOUS
  Administered 2018-04-24: 3 [IU] via SUBCUTANEOUS
  Administered 2018-04-24: 1 [IU] via SUBCUTANEOUS
  Administered 2018-04-24 – 2018-04-25 (×2): 2 [IU] via SUBCUTANEOUS
  Administered 2018-04-25 (×2): 3 [IU] via SUBCUTANEOUS
  Filled 2018-04-22 (×13): qty 1

## 2018-04-22 MED ORDER — DEXTROSE 5 % IV SOLN
700.0000 mg | INTRAVENOUS | Status: DC
  Administered 2018-04-23 – 2018-04-24 (×2): 700 mg via INTRAVENOUS
  Filled 2018-04-22 (×2): qty 14

## 2018-04-22 MED ORDER — LACTATED RINGERS IV SOLN
INTRAVENOUS | Status: DC
  Administered 2018-04-22 – 2018-04-24 (×4): via INTRAVENOUS

## 2018-04-22 MED ORDER — SODIUM CHLORIDE 0.9 % IV BOLUS
1000.0000 mL | Freq: Once | INTRAVENOUS | Status: AC
  Administered 2018-04-22: 1000 mL via INTRAVENOUS

## 2018-04-22 MED ORDER — FREE WATER
50.0000 mL | Status: DC
  Administered 2018-04-22 (×4): 50 mL

## 2018-04-22 NOTE — Progress Notes (Signed)
ANTICOAGULATION CONSULT NOTE  Pharmacy Consult for heparin drip management  Indication: DVT  No Known Allergies  Patient Measurements: Height: 5\' 5"  (165.1 cm) Weight: 184 lb 8.4 oz (83.7 kg) IBW/kg (Calculated) : 57   Vital Signs: Temp: 98.7 F (37.1 C) (08/10 1200) Temp Source: Axillary (08/10 1200) BP: 144/57 (08/10 1248) Pulse Rate: 83 (08/10 1248)  Labs: Recent Labs    04/20/18 0642 04/20/18 1601 04/21/18 0411 04/21/18 1820 04/22/18 0459 04/22/18 1158  HGB 11.3*  --  11.2*  --  10.6*  --   HCT 34.8*  --  34.3*  --  32.6*  --   PLT 177  --  168  --  138*  --   APTT 116* 56* 65*  --  42* 52*  HEPARINUNFRC 1.11* 0.64 0.74*  --  0.51  --   CREATININE 1.73*  --  1.49* 1.98* 2.34*  --     Estimated Creatinine Clearance: 20.8 mL/min (A) (by C-G formula based on SCr of 2.34 mg/dL (H)).   Assessment: Pharmacy consulted for heparin drip management for 82 yo female admitted with meningitis and requiring mechanical ventilation. Patient previously on apixaban for left leg DVT in 02/2018. Heparin currently infusing at 550units/hr.  Goal of Therapy:  Heparin level 0.3-0.7 units/ml aPTT 66-102 seconds Monitor platelets by anticoagulation protocol: Yes   Plan:  8/8 AM aPTT slightly subratherapeutic with a therapeutic HL. Hgb/Hct/Plts remains stable. Will increase the infusion to 600units/hr and recheck aPTT and HL in 8 hours.   8/9 AM aPTT 65, heparin level 0.74. Continue current regimen. Recheck aPTT, heparin level, and CBC with tomorrow AM labs.  8/10 AM aPTT 42, heparin level 0.51. Increase rate to 700 units/hr and recheck aPTT in 6 hours.  8/10 1200 aPTT 52.  Per RN, no s/sx of bleeding noted; RN confirms heparin running at 7 ml/hr.  Increase rate to 800 units/hr and recheck aPTT in 6 hours. CBC and HL in AM.  Discussed plt trend with ICU MD - plt trending down from 200 to 138 over 5 days. Per MD, ok to continue heparin as long as plt count is >90, not that significant  of drop at this point.   Pharmacy will continue to monitor and adjust per consult.   Marty HeckWang, Rasheed Welty L, PharmD Clinical Pharmacist 04/22/2018 1:17 PM

## 2018-04-22 NOTE — Progress Notes (Addendum)
Pharmacy Electrolyte Monitoring Consult:  Pharmacy consulted to assist in monitoring and replacing electrolytes in this 82 y.o. female admitted on 04/18/2018 with Altered Mental Status  Patient is on insulin drip.   MIVF: LR/1920mEq of Potassium @ 8075mL/hr.   Labs:  Sodium (mmol/L)  Date Value  04/22/2018 140  10/03/2014 142   Potassium (mmol/L)  Date Value  04/22/2018 4.8  10/03/2014 3.6   Magnesium (mg/dL)  Date Value  52/84/132408/06/2018 2.2   Phosphorus (mg/dL)  Date Value  40/10/272508/06/2018 4.1   Calcium (mg/dL)  Date Value  36/64/403408/06/2018 7.7 (L)   Calcium, Total (mg/dL)  Date Value  74/25/956301/21/2016 9.3   Albumin (g/dL)  Date Value  87/56/433208/02/2018 4.0  10/01/2014 2.6 (L)    Assessment/Plan: No additional electrolyte replacement needed at this time.   SCr trending up and K trending up to 4.8. Will take KCl out of maintenance fluids. Discussed with ICU MD.  Labs ordered for F/U on 8/11  Pharmacy will continue to monitor and adjust per consult.   Marty HeckWang, Tranise Forrest L, PharmD, BCPS Clinical Pharmacist 04/22/2018 11:18 AM

## 2018-04-22 NOTE — Progress Notes (Signed)
Name: Natalie Knight MRN: [name] DOB: Sep 11, 1936     CONSULTATION DATE: 04/18/2018  Subjective & objective: Improved mental status awake and following commands while off sedation.  PAST MEDICAL HISTORY :   has a past medical history of Diabetes mellitus without complication (HCC) and Hypertension.  has no past surgical history on file. Prior to Admission medications   Medication Sig Start Date End Date Taking? Authorizing Provider  bisacodyl (DULCOLAX) 5 MG EC tablet Take 1 tablet by mouth daily as needed.   Yes [provider]  cetirizine (ZYRTEC) 10 MG tablet Take 1 tablet by mouth daily.   Yes [provider]  docusate sodium (COLACE) 100 MG capsule Take 100 mg by mouth 2 (two) times daily as needed for mild constipation.   Yes [provider]  ELIQUIS 2.5 MG TABS tablet Take 1 tablet by mouth 2 (two) times daily. 03/31/18  Yes [provider]  escitalopram (LEXAPRO) 20 MG tablet Take 1 tablet by mouth daily. 03/20/18  Yes [provider]  gabapentin (NEURONTIN) 100 MG capsule Take 1 capsule by mouth 3 (three) times daily. 03/31/18  Yes [provider]  losartan (COZAAR) 100 MG tablet Take 1 tablet by mouth daily. 04/06/18  Yes [provider]  magnesium oxide (MAG-OX) 400 MG tablet Take 400 mg by mouth daily.   Yes [provider]  metFORMIN (GLUCOPHAGE) 500 MG tablet Take 500-1,000 mg by mouth 2 (two) times daily. 1000MG -AM,  500MG -PM 04/06/18  Yes [provider]  Multiple Vitamin (MULTI-VITAMINS) TABS Take 1 tablet by mouth daily.   Yes [provider]  omega-3 acid ethyl esters (LOVAZA) 1 g capsule Take 1 g by mouth 2 (two) times daily.   Yes [provider]  omeprazole (PRILOSEC) 20 MG capsule Take 1 capsule by mouth daily. 04/06/18  Yes [provider]  rOPINIRole (REQUIP) 1 MG tablet Take 1 tablet by mouth 2 (two) times daily. 02/16/18  Yes [provider]  insulin  aspart (NOVOLOG FLEXPEN) 100 UNIT/ML FlexPen WAITING FOR PRIOR AUTHORIZATION HAS NOT STARTED YET    [provider]   No Known Allergies  FAMILY HISTORY:  family history is not on file. SOCIAL HISTORY:  reports that she has never smoked. Her smokeless tobacco use includes snuff. She reports that she does not drink alcohol.  REVIEW OF SYSTEMS:   Unable to obtain due to critical illness   VITAL SIGNS: Temp:  [97.9 F (36.6 C)-98.9 F (37.2 C)] 98.7 F (37.1 C) (08/10 1200) Pulse Rate:  [71-103] 101 (08/10 1200) Resp:  [7-20] 15 (08/10 1200) BP: (79-135)/(45-72) 115/63 (08/10 1200) SpO2:  [96 %-100 %] 98 % (08/10 1200) FiO2 (%):  [28 %] 28 % (08/10 1145) Weight:  [83.7 kg] 83.7 kg (08/10 0332)  Physical Examination:  Off sedation, awake and following commands with no focal motor deficits On vent, no distress, bilateral equal air entry no adventitious sounds S1 & S2 are audible with no murmur Benign abdominal exam was normal peristalsis No leg edema   ASSESSMENT / PLAN:  Acute respiratory failure (improved).  Tolerating CPAP pressure support 7/5 40%. -Assist for weaning   Altered mental status with questionable meningeal encephalitis.  No acute abnormalities on MRI of brain, CT head without contrast and CT angios of the head and neck.  improved and was able to follow command -Monitor neuro status and management as per neurology.   AKI (worsening).  Concern of nephrotoxicity with vancomycin -DC vancomycin, avoid nephrotoxins, optimize  hemodynamics, monitor renal panel and urine output -Renal ultrasound and follows renal consult   Diabetes mellitus -Glycemic control on insulin drip   History of DVT -Currently on heparin drip while Eliquis on hold  Hypotension resolved -Monitor hemodynamics  Anemia -Keep hemoglobin more than 7 g/dL  Thrombocytopenia -Monitor platelets  DNR  DVT & GI prophylaxis.  Continue supportive care.  Critical care time 40  minutes

## 2018-04-22 NOTE — Progress Notes (Signed)
Iglesia Knight for heparin drip management  Indication: DVT  No Known Allergies  Patient Measurements: Height: '5\' 5"'  (165.1 cm) Weight: 184 lb 8.4 oz (83.7 kg) IBW/kg (Calculated) : 57   Vital Signs: Temp: 98.3 F (36.8 C) (08/10 0400) Temp Source: Axillary (08/10 0400) BP: 94/59 (08/10 0400) Pulse Rate: 77 (08/10 0400)  Labs: Recent Labs    04/20/18 0642 04/20/18 1601 04/21/18 0411 04/21/18 1820 04/22/18 0459  HGB 11.3*  --  11.2*  --  10.6*  HCT 34.8*  --  34.3*  --  32.6*  PLT 177  --  168  --  138*  APTT 116* 56* 65*  --  42*  HEPARINUNFRC 1.11* 0.64 0.74*  --  0.51  CREATININE 1.73*  --  1.49* 1.98* 2.34*    Estimated Creatinine Clearance: 20.8 mL/min (A) (by C-G formula based on SCr of 2.34 mg/dL (H)).   Medical History: Past Medical History:  Diagnosis Date  . Diabetes mellitus without complication ([place])   . Hypertension     Medications:  Scheduled:  . albuterol  2.5 mg Nebulization Q6H  . chlorhexidine gluconate (MEDLINE KIT)  15 mL Mouth Rinse BID  . dexamethasone  6 mg Intravenous Q8H  . feeding supplement (PRO-STAT SUGAR FREE 64)  30 mL Per Tube Daily  . free water  30 mL Per Tube Q1H  . mouth rinse  15 mL Mouth Rinse 10 times per day  . senna-docusate  1 tablet Per Tube BID   Infusions:  . sodium chloride    . acyclovir Stopped (04/21/18 2219)  . ampicillin (OMNIPEN) IV 2 g (04/22/18 0524)  . cefTRIAXone (ROCEPHIN)  IV Stopped (04/22/18 0522)  . famotidine (PEPCID) IV Stopped (04/21/18 2248)  . feeding supplement (VITAL 1.5 CAL) 50 mL/hr at 04/22/18 0400  . fentaNYL infusion INTRAVENOUS 150 mcg/hr (04/22/18 0400)  . heparin 600 Units/hr (04/22/18 0400)  . insulin (NOVOLIN-R) infusion 3 Units/hr (04/22/18 0522)  . lactated ringers with kcl 75 mL/hr at 04/22/18 0400  . norepinephrine (LEVOPHED) 64m / 2551minfusion Stopped (04/21/18 0720)    Assessment: Pharmacy consulted for heparin drip management for  [age]o female admitted with meningitis and requiring mechanical ventilation. Patient previously on apixaban for left leg DVT in 02/2018. Heparin currently infusing at 550units/hr.  Goal of Therapy:  Heparin level 0.3-0.7 units/ml aPTT 66-102 seconds Monitor platelets by anticoagulation protocol: Yes   Plan:  8/8 AM aPTT slightly subratherapeutic with a therapeutic HL. Hgb/Hct/Plts remains stable. Will increase the infusion to 600units/hr and recheck aPTT and HL in 8 hours.   8/9 AM aPTT 65, heparin level 0.74. Continue current regimen. Recheck aPTT, heparin level, and CBC with tomorrow AM labs.  8/10 AM aPTT 42, heparin level 0.51. Increase rate to 700 units/hr and recheck aPTT in 6 hours.  Pharmacy will continue to monitor and adjust per consult.   McEloise HarmanPharmD Clinical Pharmacist 04/22/2018 6:01 AM

## 2018-04-22 NOTE — Procedures (Signed)
Extubation Procedure Note  Patient Details:   Name: Natalie Knight DOB: December 28, 1935 MRN: [id number]   Airway Documentation:    Vent end date: 04/22/18 Vent end time: 1244   Evaluation  O2 sats: 98% Complications: No apparent complications Patient did tolerate procedure well. Bilateral Breath Sounds: Clear, Diminished    Patient extubated per MD order, with no complications. Patient has been extubated to room air, SAT at 98%. Clear/Diminished BBS.  Patsey BertholdCori H Westmoreland 04/22/2018, 12:49 PM

## 2018-04-22 NOTE — Progress Notes (Signed)
[facility] [place] [facility]   PATIENT NAME: Natalie Knight    MR#:  [id number]  DATE OF BIRTH:  1935/12/18  SUBJECTIVE:  CHIEF COMPLAINT:   Chief Complaint  Patient presents with  . Altered Mental Status   Patient is on ventilation, sedation, opened eyes on stimulation.  Oliguria per RN. REVIEW OF SYSTEMS:  Review of Systems  Unable to perform ROS: Intubated    DRUG ALLERGIES:  No Known Allergies VITALS:  Blood pressure (!) 144/57, pulse 83, temperature 98.7 F (37.1 C), temperature source Axillary, resp. rate 13, height 5\' 5"  (1.651 m), weight 83.7 kg, SpO2 98 %. PHYSICAL EXAMINATION:  Physical Exam  HENT:  Head: Normocephalic.  Eyes: No scleral icterus.  Bilateral pupils are pinpoint, not reactive to light.  Neck: Neck supple. No JVD present. No tracheal deviation present.  Cardiovascular: Normal rate, regular rhythm and normal heart sounds. Exam reveals no gallop.  No murmur heard. Pulmonary/Chest: Breath sounds normal. No respiratory distress. She has no wheezes. She has no rales.  Abdominal: Soft. Bowel sounds are normal. She exhibits no distension. There is no tenderness. There is no rebound.  Musculoskeletal: She exhibits no edema or tenderness.  Neurological:  Unable to exam.  Skin: No rash noted. No erythema.   LABORATORY PANEL:  Female CBC Recent Labs  Lab 04/22/18 0459  WBC 13.2*  HGB 10.6*  HCT 32.6*  PLT 138*   ------------------------------------------------------------------------------------------------------------------ Chemistries  Recent Labs  Lab 04/18/18 1208  04/22/18 0459  NA 139   < > 140  K 3.3*   < > 4.8  CL 98   < > 108  CO2 25   < > 21*  GLUCOSE 253*   < > 153*  BUN 15   < > 51*  CREATININE 1.05*   < > 2.34*  CALCIUM 9.7   < > 7.7*  MG  --    < > 2.2  AST 37  --   --   ALT 22  --   --   ALKPHOS 46  --   --   BILITOT 1.1  --   --    < > = values in this interval not displayed.   RADIOLOGY:  No  results found. ASSESSMENT AND PLAN:   Acute respiratory failure with hypoxia. Try to extubate per intensivist, continue bronchodilator therapy.  *Acute sepsis Suspected due to acute bacterial meningitis Continue ampicillin, Rocephin, Vanco and acyclovir, dexamethasone IV,  MRI of the brain- no acute findings.  No indication for LP per Dr. Thad Rangereynolds.  Leukocytosis is improving.  Blood culture is negative so far.  *Acute comatose state Most likely secondary to above, MRI and EEG done. Avoid psychotropic meds, neurochecks per routine, aspiration/fall precautions, ventilator with weaning as tolerated. negative RPR,  The patient is off sedation, open eyes on stimulation.  * Ac renal fialure, ATN   Likely due to Hypoperfusion secondary to hypotension, IV fluids, monitor.   Very little output and worsening.  Follow-up BMP.  * Hypotension.  Off Levophed drip.    Improved.  * Elevated troponin, possible due to demanding ischemia secondary to above.  * Lactic acidosis.  Due to above.  Follow-up level.  *Chronic diabetes mellitus type 2 On insulin drip.  *Chronic benign essential hypertension Hold hypertension medication due to hypotension.  *Acute hypokalemia Repleted with IV potassium, improved.  *History of DVT Hold Eliquis for now, SCDs- heparine drip.  All the records are reviewed and case discussed with Care  Management/Social Worker. Management plans discussed with the patient's daughter, and they are in agreement.  CODE STATUS: DNR  TOTAL TIME TAKING CARE OF THIS PATIENT: 36 minutes.    POSSIBLE D/C IN ? DAYS, DEPENDING ON CLINICAL CONDITION.   Shaune Pollack M.D on 04/22/2018 at 1:05 PM  Between 7am to 6pm - Pager - [phone]  After 6pm go to [email or web address] - Therapist, nutritional Hospitalists

## 2018-04-22 NOTE — Progress Notes (Signed)
ANTICOAGULATION CONSULT NOTE  Pharmacy Consult for heparin drip management  Indication: DVT  No Known Allergies  Patient Measurements: Height: 5\' 5"  (165.1 cm) Weight: 184 lb 8.4 oz (83.7 kg) IBW/kg (Calculated) : 57   Vital Signs: Temp: 98.3 F (36.8 C) (08/10 2000) Temp Source: Axillary (08/10 2000) BP: 128/67 (08/10 2100) Pulse Rate: 89 (08/10 2100)  Labs: Recent Labs    04/20/18 [id number] 04/20/18 1601 04/21/18 0411 04/21/18 1820 04/22/18 0459 04/22/18 1158 04/22/18 2101  HGB 11.3*  --  11.2*  --  10.6*  --   --   HCT 34.8*  --  34.3*  --  32.6*  --   --   PLT 177  --  168  --  138*  --   --   APTT 116* 56* 65*  --  42* 52* 64*  HEPARINUNFRC 1.11* 0.64 0.74*  --  0.51  --   --   CREATININE 1.73*  --  1.49* 1.98* 2.34*  --   --     Estimated Creatinine Clearance: 20.8 mL/min (A) (by C-G formula based on SCr of 2.34 mg/dL (H)).   Assessment: Pharmacy consulted for heparin drip management for 82 yo female admitted with meningitis and requiring mechanical ventilation. Patient previously on apixaban for left leg DVT in 02/2018. Heparin currently infusing at 550units/hr.  8/8 AM aPTT slightly subratherapeutic with a therapeutic HL. Hgb/Hct/Plts remains stable. Will increase the infusion to 600units/hr and recheck aPTT and HL in 8 hours.   8/9 AM aPTT 65, heparin level 0.74. Continue current regimen. Recheck aPTT, heparin level, and CBC with tomorrow AM labs.  8/10 AM aPTT 42, heparin level 0.51. Increase rate to 700 units/hr and recheck aPTT in 6 hours.  8/10 1200 aPTT 52.  Per RN, no s/sx of bleeding noted; RN confirms heparin running at 7 ml/hr.  Increase rate to 800 units/hr and recheck aPTT in 6 hours. CBC and HL in AM.  Discussed plt trend with ICU MD - plt trending down from 200 to 138 over 5 days. Per MD, ok to continue heparin as long as plt count is >90, not that significant of drop at this point.   Goal of Therapy:  Heparin level 0.3-0.7 units/ml aPTT 66-102  seconds Monitor platelets by anticoagulation protocol: Yes   Plan:  8/10 2101 aPTT 64 seconds. Increase rate to 850 units/hr and recheck aPTT with heparin level in the morning  Pharmacy will continue to monitor and adjust per consult.   Lowella Bandyodney D Morey Andonian, PharmD Clinical Pharmacist 04/22/2018 9:27 PM

## 2018-04-22 NOTE — Progress Notes (Signed)
Pharmacy Antibiotic Note  Natalie Knight is a 82 y.o. female Natalie Knight on 04/18/2018 with meningitis.  Pharmacy has been consulted for vancomycin, ceftriaxone and ampicillin dosing. She has a known history which includes anemia chronic disease, dementia, gout, DVT on Eliquis presenting from her apartment, found unresponsive in the bed, patient nonverbal/not following commands/no eye-opening, family member started CPR, EMS brought to the emergency room, patient noted to be comatose then intubated.   Plan: Holding vancomycin doses for now. We will draw a f/u level 04/22/18 at 2000 and adjust dose based on that level. SCr trending up from admission, may need to dose per levels.   Continue Ampicillin 2g IV Q6hr.   Continue Acyclovir 700mg  IV Q24hr (For CrCl 10- <25 mL/min).   Continue Ceftriaxone 2g IV Q12hr.      Height: 5\' 5"  (165.1 cm) Weight: 184 lb 8.4 oz (83.7 kg) IBW/kg (Calculated) : 57  Temp (24hrs), Avg:98.2 F (36.8 C), Min:97.8 F (36.6 C), Max:98.9 F (37.2 C)  Recent Labs  Lab 04/18/18 1208 04/18/18 1527 04/18/18 1755 04/18/18 2041  04/19/18 0406 04/20/18 0642 04/20/18 1734 04/21/18 0411 04/21/18 1820 04/21/18 1936 04/22/18 0459  WBC 16.3*  --   --   --   --  19.5* 28.8*  --  22.1*  --   --  13.2*  CREATININE 1.05*  --   --   --    < > 1.42* 1.73*  --  1.49* 1.98*  --  2.34*  LATICACIDVEN  --  3.4* 3.2* 3.4*  --   --   --   --   --   --   --   --   VANCOTROUGH  --   --   --   --   --   --   --   --   --   --  26*  --   VANCORANDOM  --   --   --   --   --   --   --  19  --   --   --   --    < > = values in this interval not displayed.    No Known Allergies  Antimicrobials this admission: Cefepime 8/6 x1  Ampicillin 8/6 >>  Vancomycin 8/6 >>  Ceftriaxone 8/6>> Acyclovir 8/6 >>  Microbiology results: 8/6 BCx: NG x 2 days  8/6 UCx: NG Final  8/6 MRSA PCR: negative   Thank you for allowing pharmacy to be a part of this patient's care.  Marty HeckWang, Dolan Xia L,  PharmD, BCPS Clinical Pharmacist 04/22/2018 11:25 AM

## 2018-04-23 ENCOUNTER — Inpatient Hospital Stay: Payer: Medicare HMO

## 2018-04-23 LAB — CBC WITH DIFFERENTIAL/PLATELET
BASOS PCT: 0 %
Basophils Absolute: 0 10*3/uL (ref 0–0.1)
EOS ABS: 0 10*3/uL (ref 0–0.7)
EOS PCT: 0 %
HCT: 33.4 % — ABNORMAL LOW (ref 35.0–47.0)
Hemoglobin: 10.9 g/dL — ABNORMAL LOW (ref 12.0–16.0)
Lymphocytes Relative: 8 %
Lymphs Abs: 1 10*3/uL (ref 1.0–3.6)
MCH: 25 pg — ABNORMAL LOW (ref 26.0–34.0)
MCHC: 32.6 g/dL (ref 32.0–36.0)
MCV: 76.8 fL — ABNORMAL LOW (ref 80.0–100.0)
Monocytes Absolute: 1.2 10*3/uL — ABNORMAL HIGH (ref 0.2–0.9)
Monocytes Relative: 10 %
NEUTROS PCT: 82 %
Neutro Abs: 10.1 10*3/uL — ABNORMAL HIGH (ref 1.4–6.5)
PLATELETS: 154 10*3/uL (ref 150–440)
RBC: 4.35 MIL/uL (ref 3.80–5.20)
RDW: 15.3 % — ABNORMAL HIGH (ref 11.5–14.5)
WBC: 12.2 10*3/uL — AB (ref 3.6–11.0)

## 2018-04-23 LAB — APTT
aPTT: 89 seconds — ABNORMAL HIGH (ref 24–36)
aPTT: 105 seconds — ABNORMAL HIGH (ref 24–36)

## 2018-04-23 LAB — COMPREHENSIVE METABOLIC PANEL
ALK PHOS: 42 U/L (ref 38–126)
ALT: 93 U/L — ABNORMAL HIGH (ref 0–44)
ANION GAP: 13 (ref 5–15)
AST: 112 U/L — ABNORMAL HIGH (ref 15–41)
Albumin: 2.6 g/dL — ABNORMAL LOW (ref 3.5–5.0)
BILIRUBIN TOTAL: 0.7 mg/dL (ref 0.3–1.2)
BUN: 56 mg/dL — ABNORMAL HIGH (ref 8–23)
CALCIUM: 8.2 mg/dL — AB (ref 8.9–10.3)
CO2: 20 mmol/L — ABNORMAL LOW (ref 22–32)
CREATININE: 2.02 mg/dL — AB (ref 0.44–1.00)
Chloride: 110 mmol/L (ref 98–111)
GFR calc non Af Amer: 22 mL/min — ABNORMAL LOW (ref >60)
GFR, EST AFRICAN AMERICAN: 26 mL/min — AB (ref >60)
Glucose, Bld: 146 mg/dL — ABNORMAL HIGH (ref 70–99)
Potassium: 4.5 mmol/L (ref 3.5–5.1)
Sodium: 143 mmol/L (ref 135–145)
TOTAL PROTEIN: 5.3 g/dL — AB (ref 6.5–8.1)

## 2018-04-23 LAB — HEPARIN LEVEL (UNFRACTIONATED)
HEPARIN UNFRACTIONATED: 0.84 [IU]/mL — AB (ref 0.30–0.70)
HEPARIN UNFRACTIONATED: 0.88 [IU]/mL — AB (ref 0.30–0.70)

## 2018-04-23 LAB — GLUCOSE, CAPILLARY
GLUCOSE-CAPILLARY: 146 mg/dL — AB (ref 70–99)
GLUCOSE-CAPILLARY: 141 mg/dL — AB (ref 70–99)
Glucose-Capillary: 115 mg/dL — ABNORMAL HIGH (ref 70–99)
Glucose-Capillary: 135 mg/dL — ABNORMAL HIGH (ref 70–99)
Glucose-Capillary: 108 mg/dL — ABNORMAL HIGH (ref 70–99)

## 2018-04-23 LAB — MAGNESIUM: Magnesium: 2.1 mg/dL (ref 1.7–2.4)

## 2018-04-23 LAB — CULTURE, BLOOD (ROUTINE X 2)
Culture: NO GROWTH
Culture: NO GROWTH
SPECIAL REQUESTS: ADEQUATE

## 2018-04-23 LAB — LACTIC ACID, PLASMA: LACTIC ACID, VENOUS: 1.7 mmol/L (ref 0.5–1.9)

## 2018-04-23 LAB — PHOSPHORUS: Phosphorus: 3.6 mg/dL (ref 2.5–4.6)

## 2018-04-23 MED ORDER — ORAL CARE MOUTH RINSE
15.0000 mL | Freq: Two times a day (BID) | OROMUCOSAL | Status: DC
  Administered 2018-04-24 – 2018-05-05 (×16): 15 mL via OROMUCOSAL

## 2018-04-23 MED ORDER — CHLORHEXIDINE GLUCONATE 0.12 % MT SOLN
15.0000 mL | Freq: Two times a day (BID) | OROMUCOSAL | Status: DC
  Administered 2018-04-23 – 2018-05-06 (×20): 15 mL via OROMUCOSAL
  Filled 2018-04-23 (×18): qty 15

## 2018-04-23 MED ORDER — GUAIFENESIN-CODEINE 100-10 MG/5ML PO SOLN
10.0000 mL | Freq: Four times a day (QID) | ORAL | Status: DC
  Administered 2018-04-23 – 2018-04-24 (×2): 10 mL via ORAL
  Filled 2018-04-23 (×3): qty 10

## 2018-04-23 NOTE — Progress Notes (Signed)
ANTICOAGULATION CONSULT NOTE  Pharmacy Consult for heparin drip management  Indication: DVT  No Known Allergies  Patient Measurements: Height: 5\' 5"  (165.1 cm) Weight: 193 lb 5.5 oz (87.7 kg) IBW/kg (Calculated) : 57   Vital Signs: Temp: 97.9 F (36.6 C) (08/11 0400) Temp Source: Axillary (08/11 0400) BP: 140/110 (08/11 0500) Pulse Rate: 82 (08/11 0600)  Labs: Recent Labs    04/21/18 0411 04/21/18 1820 04/22/18 0459 04/22/18 1158 04/22/18 2101 04/23/18 0547 04/23/18 0608  HGB 11.2*  --  10.6*  --   --   --  10.9*  HCT 34.3*  --  32.6*  --   --   --  33.4*  PLT 168  --  138*  --   --   --  154  APTT 65*  --  42* 52* 64* 89*  --   HEPARINUNFRC 0.74*  --  0.51  --   --  0.84*  --   CREATININE 1.49* 1.98* 2.34*  --   --   --  2.02*    Estimated Creatinine Clearance: 24.7 mL/min (A) (by C-G formula based on SCr of 2.02 mg/dL (H)).   Assessment: Pharmacy consulted for heparin drip management for 82 yo female admitted with meningitis and requiring mechanical ventilation. Patient previously on apixaban for left leg DVT in 02/2018. Heparin currently infusing at 550units/hr.  8/8 AM aPTT slightly subratherapeutic with a therapeutic HL. Hgb/Hct/Plts remains stable. Will increase the infusion to 600units/hr and recheck aPTT and HL in 8 hours.   8/9 AM aPTT 65, heparin level 0.74. Continue current regimen. Recheck aPTT, heparin level, and CBC with tomorrow AM labs.  8/10 AM aPTT 42, heparin level 0.51. Increase rate to 700 units/hr and recheck aPTT in 6 hours.  8/10 1200 aPTT 52.  Per RN, no s/sx of bleeding noted; RN confirms heparin running at 7 ml/hr.  Increase rate to 800 units/hr and recheck aPTT in 6 hours. CBC and HL in AM.  Discussed plt trend with ICU MD - plt trending down from 200 to 138 over 5 days. Per MD, ok to continue heparin as long as plt count is >90, not that significant of drop at this point.   Goal of Therapy:  Heparin level 0.3-0.7 units/ml aPTT 66-102  seconds Monitor platelets by anticoagulation protocol: Yes   Plan:  8/10 2101 aPTT 64 seconds. Increase rate to 850 units/hr and recheck aPTT with heparin level in the morning  8/11 AM aPTT 89, heparin level 0.84. Continue current regimen. Recheck heparin level, aPTT, and CBC with tomorrow AM labs  Pharmacy will continue to monitor and adjust per consult.   Erich MontaneMcBane,Ashima Shrake S, PharmD Clinical Pharmacist 04/23/2018 6:59 AM

## 2018-04-23 NOTE — Progress Notes (Signed)
Pharmacy Antibiotic Note  Natalie Knight is a 82 y.o. female admitted on 04/18/2018 with meningitis.  Pharmacy has been consulted for vancomycin, ceftriaxone and ampicillin dosing. She has a known history which includes anemia chronic disease, dementia, gout, DVT on Eliquis presenting from her apartment, found unresponsive in the bed, patient nonverbal/not following commands/no eye-opening, family member started CPR, EMS brought to the emergency room, patient noted to be comatose then intubated.  Vancomycin d/c'd 8/10 due to worsening AKI and concern of nephrotoxicity by MD.  Renal function improving today 2.34>2.02, CrCl 24.7 ml/min  Plan: Continue Ampicillin 2g IV Q6hr.   Continue Acyclovir 700mg  IV Q24hr (For CrCl 10- <25 mL/min). May need to adjust back to q12h tomorrow if renal function continues to improve.   Continue Ceftriaxone 2g IV Q12hr.      Height: 5\' 5"  (165.1 cm) Weight: 193 lb 5.5 oz (87.7 kg) IBW/kg (Calculated) : 57  Temp (24hrs), Avg:98.4 F (36.9 C), Min:97.9 F (36.6 C), Max:98.7 F (37.1 C)  Recent Labs  Lab 04/18/18 1527 04/18/18 1755 04/18/18 2041  04/19/18 0406 04/20/18 [id number] 04/20/18 1734 04/21/18 0411 04/21/18 1820 04/21/18 1936 04/22/18 0459 04/23/18 0608  WBC  --   --   --   --  19.5* 28.8*  --  22.1*  --   --  13.2* 12.2*  CREATININE  --   --   --    < > 1.42* 1.73*  --  1.49* 1.98*  --  2.34* 2.02*  LATICACIDVEN 3.4* 3.2* 3.4*  --   --   --   --   --   --   --   --  1.7  VANCOTROUGH  --   --   --   --   --   --   --   --   --  26*  --   --   VANCORANDOM  --   --   --   --   --   --  19  --   --   --   --   --    < > = values in this interval not displayed.    No Known Allergies  Antimicrobials this admission: Cefepime 8/6 x1  Ampicillin 8/6 >>  Vancomycin 8/6 >> 8/10 Ceftriaxone 8/6>> Acyclovir 8/6 >>  Microbiology results: 8/6 BCx: NG final 8/6 UCx: NG Final  8/6 MRSA PCR: negative   Thank you for allowing pharmacy to be a part  of this patient's care.  Marty HeckWang, Lucelia Lacey L, PharmD, BCPS Clinical Pharmacist 04/23/2018 10:16 AM

## 2018-04-23 NOTE — Progress Notes (Signed)
[employer] - [place] [facility]   PATIENT NAME: Natalie Knight    MR#:  [id number]  DATE OF BIRTH:  [birth year]  SUBJECTIVE:  CHIEF COMPLAINT:   Chief Complaint  Patient presents with  . Altered Mental Status   Patient extubated but is still drowsy, better urine output. REVIEW OF SYSTEMS:  Review of Systems  Unable to perform ROS: Intubated    DRUG ALLERGIES:  No Known Allergies VITALS:  Blood pressure (!) 133/44, pulse 93, temperature 99 F (37.2 C), temperature source Axillary, resp. rate 18, height 5\' 5"  (1.651 m), weight 87.7 kg, SpO2 99 %. PHYSICAL EXAMINATION:  Physical Exam  HENT:  Head: Normocephalic.  Eyes: No scleral icterus.  Bilateral pupils are pinpoint, not reactive to light.  Neck: Neck supple. No JVD present. No tracheal deviation present.  Cardiovascular: Normal rate, regular rhythm and normal heart sounds. Exam reveals no gallop.  No murmur heard. Pulmonary/Chest: Breath sounds normal. No respiratory distress. She has no wheezes. She has no rales.  Abdominal: Soft. Bowel sounds are normal. She exhibits no distension. There is no tenderness. There is no rebound.  Musculoskeletal: She exhibits no edema or tenderness.  Neurological:  Drowsy, not follow command, unable to exam.  Skin: No rash noted. No erythema.   LABORATORY PANEL:  Female CBC Recent Labs  Lab 04/23/18 0608  WBC 12.2*  HGB 10.9*  HCT 33.4*  PLT 154   ------------------------------------------------------------------------------------------------------------------ Chemistries  Recent Labs  Lab 04/23/18 0608  NA 143  K 4.5  CL 110  CO2 20*  GLUCOSE 146*  BUN 56*  CREATININE 2.02*  CALCIUM 8.2*  MG 2.1  AST 112*  ALT 93*  ALKPHOS 42  BILITOT 0.7   RADIOLOGY:  [place] Renal  Result Date: 04/22/2018 CLINICAL DATA:  Acute kidney injury EXAM: RENAL / URINARY TRACT ULTRASOUND COMPLETE COMPARISON:  None. FINDINGS: Right Kidney: Length: 10.6 cm. Echogenicity  within normal limits. No mass or hydronephrosis visualized. Left Kidney: Length: 8.5 cm. Echogenicity within normal limits. No mass or hydronephrosis visualized. Bladder: Appears normal for degree of bladder distention. IMPRESSION: 1. No acute renal abnormality. Asymmetrically smaller left kidney compared to the right without focal abnormality. Electronically Signed   By: Elige KoHetal  Patel   On: 04/22/2018 15:03   Dg Chest Port 1 View  Result Date: 04/23/2018 CLINICAL DATA:  Atelectasis EXAM: PORTABLE CHEST 1 VIEW COMPARISON:  04/19/2018 FINDINGS: Interval extubation. Mildly increased interstitial markings. Mild left basilar atelectasis. No pleural effusion or pneumothorax. The heart is normal in size. Right IJ venous catheter terminates at the cavoatrial junction. IMPRESSION: No evidence of acute cardiopulmonary disease. Electronically Signed   By: Charline BillsSriyesh  Krishnan M.D.   On: 04/23/2018 08:05   ASSESSMENT AND PLAN:   Acute respiratory failure with hypoxia. Patient was extubated.  In room air.  *Acute sepsis Suspected due to acute bacterial meningitis Continue ampicillin, Rocephin and acyclovir, dexamethasone IV,  MRI of the brain- no acute findings.  No indication for LP per Dr. Thad Rangereynolds.  Leukocytosis is improving.  Blood culture is negative so far.  Vancomycin was discontinued.  *Acute comatose state Most likely secondary to above, MRI and EEG done. Avoid psychotropic meds, neurochecks per routine, aspiration/fall precautions,  negative RPR.  * Ac renal fialure, ATN   Likely due to Hypoperfusion secondary to hypotension, IV fluids, monitor. Better, Follow-up BMP.  * Hypotension.  Off Levophed drip.    Improved.  * Elevated troponin, possible due to demanding ischemia secondary to above.  *  Lactic acidosis.  Due to above.  Improved.  *Chronic diabetes mellitus type 2 On sliding scale.  *Chronic benign essential hypertension Hold hypertension medication due to  hypotension.  *Acute hypokalemia Repleted with IV potassium, improved.  *History of DVT Hold Eliquis for now, SCDs- heparine drip.  All the records are reviewed and case discussed with Care Management/Social Worker. Management plans discussed with the patient's daughter, and they are in agreement.  CODE STATUS: DNR  TOTAL TIME TAKING CARE OF THIS PATIENT: 36 minutes.    POSSIBLE D/C IN ? DAYS, DEPENDING ON CLINICAL CONDITION.   Shaune Pollack M.D on 04/23/2018 at 12:34 PM  Between 7am to 6pm - Pager - [phone]  After 6pm go to [email or web address] - Therapist, nutritional Hospitalists

## 2018-04-23 NOTE — Progress Notes (Signed)
Patient continue on heparin gtt. LR and IV ABX. Has been restless all shift. Had Benadryl IV that was ineffective.  NPO until ST eval today. On RA and maintaining sats. Continue to monitor.

## 2018-04-23 NOTE — Progress Notes (Signed)
Name: Fuller Planlsie P Pellicano MRN: [id number] DOB: 02/19/1936     CONSULTATION DATE: 04/18/2018  Subjective & Objective: Tolerating [place] and improved neuro status  PAST MEDICAL HISTORY :   has a past medical history of Diabetes mellitus without complication (HCC) and Hypertension.  has no past surgical history on file. Prior to Admission medications   Medication Sig Start Date End Date Taking? Authorizing Provider  bisacodyl (DULCOLAX) 5 MG EC tablet Take 1 tablet by mouth daily as needed.   Yes [provider]  cetirizine (ZYRTEC) 10 MG tablet Take 1 tablet by mouth daily.   Yes [provider]  docusate sodium (COLACE) 100 MG capsule Take 100 mg by mouth 2 (two) times daily as needed for mild constipation.   Yes [provider]  ELIQUIS 2.5 MG TABS tablet Take 1 tablet by mouth 2 (two) times daily. 03/31/18  Yes [provider]  escitalopram (LEXAPRO) 20 MG tablet Take 1 tablet by mouth daily. 03/20/18  Yes [provider]  gabapentin (NEURONTIN) 100 MG capsule Take 1 capsule by mouth 3 (three) times daily. 03/31/18  Yes [provider]  losartan (COZAAR) 100 MG tablet Take 1 tablet by mouth daily. 04/06/18  Yes [provider]  magnesium oxide (MAG-OX) 400 MG tablet Take 400 mg by mouth daily.   Yes [provider]  metFORMIN (GLUCOPHAGE) 500 MG tablet Take 500-1,000 mg by mouth 2 (two) times daily. 1000MG -AM,  500MG -PM 04/06/18  Yes [provider]  Multiple Vitamin (MULTI-VITAMINS) TABS Take 1 tablet by mouth daily.   Yes [provider]  omega-3 acid ethyl esters (LOVAZA) 1 g capsule Take 1 g by mouth 2 (two) times daily.   Yes [provider]  omeprazole (PRILOSEC) 20 MG capsule Take 1 capsule by mouth daily. 04/06/18  Yes [provider]  rOPINIRole (REQUIP) 1 MG tablet Take 1 tablet by mouth 2 (two) times daily. 02/16/18  Yes [provider]  insulin aspart (NOVOLOG FLEXPEN) 100  UNIT/ML FlexPen WAITING FOR PRIOR AUTHORIZATION HAS NOT STARTED YET    [provider]   No Known Allergies  FAMILY HISTORY:  family history is not on file. SOCIAL HISTORY:  reports that she has never smoked. Her smokeless tobacco use includes snuff. She reports that she does not drink alcohol.  REVIEW OF SYSTEMS:   Unable to obtain due to critical illness   VITAL SIGNS: Temp:  [97.9 F (36.6 C)-99 F (37.2 C)] 99 F (37.2 C) (08/11 1116) Pulse Rate:  [79-107] 93 (08/11 1100) Resp:  [9-24] 18 (08/11 1100) BP: (84-144)/(44-110) 133/44 (08/11 1100) SpO2:  [95 %-100 %] 99 % (08/11 1100) Weight:  [87.7 kg] 87.7 kg (08/11 0500)  Physical Examination:  Off sedation, awake and following commands with no focal motor deficits On vent, no distress, bilateral equal air entry no adventitious sounds S1 & S2 are audible with no murmur Benign abdominal exam was normal peristalsis No leg edema  ASSESSMENT / PLAN:  Acute respiratory failure (improved). Extubated on 04/22/2018 and tolerating [place] -Monitor work of breathing and O2 sat.  Altered mental status (improved) with questionable meningeal encephalitis.  No acute abnormalities on MRI of brain, CT head without contrast and CT angios of the head and neck.  -Monitor neuro status and management as per neurology.   AKI (improved).  Concern of nephrotoxicity with vancomycin -Off vancomycin,Off Losartan, avoid nephrotoxins, optimize hemodynamics, monitor renal panel and urine output -No acute abnormality on renal ultrasound -Follows with renal  Diabetes mellitus -Glycemic control on insulin drip  History of DVT -Currently on heparin drip while Eliquis on hold  Hypotension resolved -Monitor hemodynamics  Anemia -Keep hemoglobin more than 7 g/dL  Thrombocytopenia -Monitor platelets  DNR  DVT & GI prophylaxis.  Continue supportive care.  Critical care time 40 min

## 2018-04-23 NOTE — Progress Notes (Signed)
ANTICOAGULATION CONSULT NOTE  Pharmacy Consult for heparin drip management  Indication: DVT  No Known Allergies  Patient Measurements: Height: 5\' 5"  (165.1 cm) Weight: 193 lb 5.5 oz (87.7 kg) IBW/kg (Calculated) : 57   Vital Signs: Temp: 98.5 F (36.9 C) (08/11 0743) Temp Source: Oral (08/11 0743) BP: 108/68 (08/11 0800) Pulse Rate: 88 (08/11 0800)  Labs: Recent Labs    04/21/18 0411 04/21/18 1820 04/22/18 0459 04/22/18 1158 04/22/18 2101 04/23/18 0547 04/23/18 0608  HGB 11.2*  --  10.6*  --   --   --  10.9*  HCT 34.3*  --  32.6*  --   --   --  33.4*  PLT 168  --  138*  --   --   --  154  APTT 65*  --  42* 52* 64* 89*  --   HEPARINUNFRC 0.74*  --  0.51  --   --  0.84*  --   CREATININE 1.49* 1.98* 2.34*  --   --   --  2.02*    Estimated Creatinine Clearance: 24.7 mL/min (A) (by C-G formula based on SCr of 2.02 mg/dL (H)).   Assessment: Pharmacy consulted for heparin drip management for 82 yo female admitted with meningitis and requiring mechanical ventilation. Patient previously on apixaban for left leg DVT in 02/2018. Heparin currently infusing at 550units/hr.  8/8 AM aPTT slightly subratherapeutic with a therapeutic HL. Hgb/Hct/Plts remains stable. Will increase the infusion to 600units/hr and recheck aPTT and HL in 8 hours.   8/9 AM aPTT 65, heparin level 0.74. Continue current regimen. Recheck aPTT, heparin level, and CBC with tomorrow AM labs.  8/10 AM aPTT 42, heparin level 0.51. Increase rate to 700 units/hr and recheck aPTT in 6 hours.  8/10 1200 aPTT 52.  Per RN, no s/sx of bleeding noted; RN confirms heparin running at 7 ml/hr.  Increase rate to 800 units/hr and recheck aPTT in 6 hours. CBC and HL in AM.  Discussed plt trend with ICU MD - plt trending down from 200 to 138 over 5 days. Per MD, ok to continue heparin as long as plt count is >90, not that significant of drop at this point.   8/10 2101 aPTT 64 seconds. Increase rate to 850 units/hr and recheck  aPTT with heparin level in the morning  8/11 AM aPTT 89, heparin level 0.84. Continue current regimen. Recheck heparin level, aPTT, and CBC with tomorrow AM labs  Goal of Therapy:  Heparin level 0.3-0.7 units/ml aPTT 66-102 seconds Monitor platelets by anticoagulation protocol: Yes   Plan:  Would expect labs to be correlating by now based on last apixaban dose/days of admission but unclear with low aPTTs yesterday; heparin level trending up today. Will recheck aPTT and HL in 8h. Hgb and plt count improved from yesterday.   Pharmacy will continue to monitor and adjust per consult.   Marty HeckWang, Breslin Burklow L, PharmD Clinical Pharmacist 04/23/2018 10:07 AM

## 2018-04-23 NOTE — Progress Notes (Addendum)
ANTICOAGULATION CONSULT NOTE  Pharmacy Consult for heparin drip management  Indication: DVT  No Known Allergies  Patient Measurements: Height: 5\' 5"  (165.1 cm) Weight: 193 lb 5.5 oz (87.7 kg) IBW/kg (Calculated) : 57   Vital Signs: Temp: 99 F (37.2 C) (08/11 1116) Temp Source: Axillary (08/11 1116) BP: 125/67 (08/11 1400) Pulse Rate: 86 (08/11 1400)  Labs: Recent Labs    04/21/18 0411 04/21/18 1820 04/22/18 0459  04/22/18 2101 04/23/18 0547 04/23/18 0608 04/23/18 1447  HGB 11.2*  --  10.6*  --   --   --  10.9*  --   HCT 34.3*  --  32.6*  --   --   --  33.4*  --   PLT 168  --  138*  --   --   --  154  --   APTT 65*  --  42*   < > 64* 89*  --  105*  HEPARINUNFRC 0.74*  --  0.51  --   --  0.84*  --  0.88*  CREATININE 1.49* 1.98* 2.34*  --   --   --  2.02*  --    < > = values in this interval not displayed.    Estimated Creatinine Clearance: 24.7 mL/min (A) (by C-G formula based on SCr of 2.02 mg/dL (H)).   Assessment: Pharmacy consulted for heparin drip management for 82 yo female admitted with meningitis and requiring mechanical ventilation. Patient previously on apixaban for left leg DVT in 02/2018. Heparin currently infusing at 550units/hr.  8/8 AM aPTT slightly subratherapeutic with a therapeutic HL. Hgb/Hct/Plts remains stable. Will increase the infusion to 600units/hr and recheck aPTT and HL in 8 hours.   8/9 AM aPTT 65, heparin level 0.74. Continue current regimen. Recheck aPTT, heparin level, and CBC with tomorrow AM labs.  8/10 AM aPTT 42, heparin level 0.51. Increase rate to 700 units/hr and recheck aPTT in 6 hours.  8/10 1200 aPTT 52.  Per RN, no s/sx of bleeding noted; RN confirms heparin running at 7 ml/hr.  Increase rate to 800 units/hr and recheck aPTT in 6 hours. CBC and HL in AM.  Discussed plt trend with ICU MD - plt trending down from 200 to 138 over 5 days. Per MD, ok to continue heparin as long as plt count is >90, not that significant of drop at  this point.   8/10 2101 aPTT 64 seconds. Increase rate to 850 units/hr and recheck aPTT with heparin level in the morning  8/11 AM aPTT 89, heparin level 0.84. Continue current regimen. Recheck heparin level, aPTT, and CBC with tomorrow AM labs  Goal of Therapy:  Heparin level 0.3-0.7 units/ml aPTT 66-102 seconds Monitor platelets by anticoagulation protocol: Yes   Plan:  8/11 1447  aPTT 105, heparin level 0.88. Continue current regimen of 850 units/hr. Recheck CBC with tomorrow AM labs. Recheck HL and APTT tomorrow @ 1500.  Pharmacy will continue to monitor and adjust per consult.   Mauri ReadingSavanna M Kahdijah Errickson, PharmD Clinical Pharmacist 04/23/2018 3:28 PM

## 2018-04-23 NOTE — Progress Notes (Signed)
ANTICOAGULATION CONSULT NOTE  Pharmacy Consult for heparin drip management  Indication: DVT  No Known Allergies  Patient Measurements: Height: 5\' 5"  (165.1 cm) Weight: 193 lb 5.5 oz (87.7 kg) IBW/kg (Calculated) : 57   Vital Signs: Temp: 98.7 F (37.1 C) (08/11 1606) Temp Source: Axillary (08/11 1606) BP: 117/53 (08/11 1606) Pulse Rate: 86 (08/11 1606)  Labs: Recent Labs    04/21/18 0411 04/21/18 1820 04/22/18 0459  04/22/18 2101 04/23/18 0547 04/23/18 0608 04/23/18 1447  HGB 11.2*  --  10.6*  --   --   --  10.9*  --   HCT 34.3*  --  32.6*  --   --   --  33.4*  --   PLT 168  --  138*  --   --   --  154  --   APTT 65*  --  42*   < > 64* 89*  --  105*  HEPARINUNFRC 0.74*  --  0.51  --   --  0.84*  --  0.88*  CREATININE 1.49* 1.98* 2.34*  --   --   --  2.02*  --    < > = values in this interval not displayed.    Estimated Creatinine Clearance: 24.7 mL/min (A) (by C-G formula based on SCr of 2.02 mg/dL (H)).   Assessment: Pharmacy consulted for heparin drip management for 82 yo female admitted with meningitis and requiring mechanical ventilation. Patient previously on apixaban for left leg DVT in 02/2018. Heparin currently infusing at 550units/hr.  8/8 AM aPTT slightly subratherapeutic with a therapeutic HL. Hgb/Hct/Plts remains stable. Will increase the infusion to 600units/hr and recheck aPTT and HL in 8 hours.   8/9 AM aPTT 65, heparin level 0.74. Continue current regimen. Recheck aPTT, heparin level, and CBC with tomorrow AM labs.  8/10 AM aPTT 42, heparin level 0.51. Increase rate to 700 units/hr and recheck aPTT in 6 hours.  8/10 1200 aPTT 52.  Per RN, no s/sx of bleeding noted; RN confirms heparin running at 7 ml/hr.  Increase rate to 800 units/hr and recheck aPTT in 6 hours. CBC and HL in AM.  Discussed plt trend with ICU MD - plt trending down from 200 to 138 over 5 days. Per MD, ok to continue heparin as long as plt count is >90, not that significant of drop  at this point.   8/10 2101 aPTT 64 seconds. Increase rate to 850 units/hr and recheck aPTT with heparin level in the morning  8/11 AM aPTT 89, heparin level 0.84. Continue current regimen. Recheck heparin level, aPTT, and CBC with tomorrow AM labs  Goal of Therapy:  Heparin level 0.3-0.7 units/ml aPTT 66-102 seconds Monitor platelets by anticoagulation protocol: Yes   Plan:  8/11 1447  aPTT 105, heparin level 0.88.  Both levels are high, finally correlating?? Will decrease heparin drip to 800 units/hr. Recheck HL and APTT 8h after rate change. Recheck CBC with tomorrow AM labs. Informed RN of plan, RN confirms heparin drip has been running at 8.5 ml/hr and no s/sx of bleeding noted.   Pharmacy will continue to monitor and adjust per consult.   Marty HeckWang, [place] Edwin L, PharmD Clinical Pharmacist 04/23/2018 5:13 PM

## 2018-04-23 NOTE — Consult Note (Signed)
[facility]  CONSULT NOTE    Date: 04/23/2018                  Patient Name:  Natalie Knight  MRN: [id number]  DOB: 1936/02/01  Age / Sex: 82 y.o., female         PCP: Barbette ReichmannHande, Vishwanath, MD                 Service Requesting Consult: Dr. Beryle LatheMaged                 Reason for Consult: Acute Renal Failure            History of Present Illness: Ms. Natalie Planlsie P Lortz is a 82 y.o. black female with diabetes mellitus type II, dementia, anemia, hypertension, gout, DVT on eliquis, restless leg syndrome, who was [facility] on 04[facility]18[facility]2018 for Acute respiratory failure (HCC) [J96.00] Status epilepticus (HCC) [G40.901] Meningitis [G03.9] Sepsis, due to unspecified organism [facility](HCC) [A41.9]  Patient was intubated and sedated. Extubated yesterday.   Received IV contrast on 8/6. Baseline creatinine on admission of 1.05.   Son and Daughter at bedside.    Medications: Outpatient medications: Medications Prior to Admission  Medication Sig Dispense Refill Last Dose  . bisacodyl (DULCOLAX) 5 MG EC tablet Take 1 tablet by mouth daily as needed.     . cetirizine (ZYRTEC) 10 MG tablet Take 1 tablet by mouth daily.     Marland Kitchen. docusate sodium (COLACE) 100 MG capsule Take 100 mg by mouth 2 (two) times daily as needed for mild constipation.     Marland Kitchen. ELIQUIS 2.5 MG TABS tablet Take 1 tablet by mouth 2 (two) times daily.     Marland Kitchen. escitalopram (LEXAPRO) 20 MG tablet Take 1 tablet by mouth daily.     Marland Kitchen. gabapentin (NEURONTIN) 100 MG capsule Take 1 capsule by mouth 3 (three) times daily.     Marland Kitchen. losartan (COZAAR) 100 MG tablet Take 1 tablet by mouth daily.     . magnesium oxide (MAG-OX) 400 MG tablet Take 400 mg by mouth daily.     . metFORMIN (GLUCOPHAGE) 500 MG tablet Take 500-1,000 mg by mouth 2 (two) times daily. 1000MG -AM,  500MG -PM     . Multiple Vitamin (MULTI-VITAMINS) TABS Take 1 tablet by mouth daily.     Marland Kitchen. omega-3 acid ethyl esters (LOVAZA) 1 g capsule Take 1 g by mouth 2 (two) times daily.      Marland Kitchen. omeprazole (PRILOSEC) 20 MG capsule Take 1 capsule by mouth daily.     Marland Kitchen. rOPINIRole (REQUIP) 1 MG tablet Take 1 tablet by mouth 2 (two) times daily.     . insulin aspart (NOVOLOG FLEXPEN) 100 UNIT/ML FlexPen WAITING FOR PRIOR AUTHORIZATION HAS NOT STARTED YET       Current medications: Current Facility-Administered Medications  Medication Dose Route Frequency Provider Last Rate Last Dose  . 0.9 %  sodium chloride infusion  250 mL Intravenous PRN Salary, Montell D, MD      . acetaminophen (TYLENOL) suppository 650 mg  650 mg Rectal Q6H PRN Erin FullingKasa, Kurian, MD   650 mg at 04/19/18 0940  . acyclovir (ZOVIRAX) 700 mg in dextrose 5 % 100 mL IVPB  700 mg Intravenous Q24H Crist FatWang, Hannah L, RPH      . albuterol (PROVENTIL) (2.5 MG/3ML) 0.083% nebulizer solution 2.5 mg  2.5 mg Nebulization Q6H Samaan, Maged, MD   2.5 mg at 04/23/18 0758  . ampicillin (OMNIPEN) 2 g in sodium chloride 0.9 %  100 mL IVPB  2 g Intravenous Q6H Salary, Evelena Asa, MD   Stopped at 04/23/18 [phone]  . cefTRIAXone (ROCEPHIN) 2 g in sodium chloride 0.9 % 100 mL IVPB  2 g Intravenous Q12H Salary, Evelena Asa, MD   Stopped at 04/23/18 0557  . famotidine (PEPCID) IVPB 20 mg premix  20 mg Intravenous Q24H Gardner Candle, RPH   Stopped at 04/22/18 2233  . guaiFENesin-codeine 100-10 MG/5ML solution 10 mL  10 mL Oral Q6H Tukov-Yual, Magdalene S, NP      . heparin ADULT infusion 100 units/mL (25000 units/241mL sodium chloride 0.45%)  850 Units/hr Intravenous Continuous Shaune Pollack, MD 8.5 mL/hr at 04/22/18 2331 850 Units/hr at 04/22/18 2331  . hydrALAZINE (APRESOLINE) injection 10 mg  10 mg Intravenous Q4H PRN [name] D, NP      . insulin aspart (novoLOG) injection 0-9 Units  0-9 Units Subcutaneous Q4H Tukov-Yual, Magdalene S, NP   1 Units at 04/23/18 0739  . lactated ringers infusion   Intravenous Continuous Uvaldo Rising, MD 75 mL/hr at 04/23/18 0139    . norepinephrine (LEVOPHED) 16 mg in dextrose 5 % 250 mL (0.064 mg/mL) infusion   0-40 mcg/min Intravenous Titrated Eugenie Norrie, NP   Stopped at 04/21/18 0720  . ondansetron (ZOFRAN) injection 4 mg  4 mg Intravenous Q6H PRN Salary, Montell D, MD      . senna-docusate (Senokot-S) tablet 1 tablet  1 tablet Per Tube BID Erin Fulling, MD   1 tablet at 04/22/18 9604      Allergies: No Known Allergies    Past Medical History: Past Medical History:  Diagnosis Date  . Diabetes mellitus without complication (HCC)   . Hypertension      Past Surgical History: History reviewed. No pertinent surgical history.   Family History: History reviewed. No pertinent family history.   Social History: Social History   Socioeconomic History  . Marital status: Widowed    Spouse name: Not on file  . Number of children: Not on file  . Years of education: Not on file  . Highest education level: Not on file  Occupational History  . Not on file  Social Needs  . Financial resource strain: Not on file  . Food insecurity:    Worry: Not on file    Inability: Not on file  . Transportation needs:    Medical: Not on file    Non-medical: Not on file  Tobacco Use  . Smoking status: Never Smoker  . Smokeless tobacco: Current User    Types: Snuff  Substance and Sexual Activity  . Alcohol use: No  . Drug use: Not on file  . Sexual activity: Not on file  Lifestyle  . Physical activity:    Days per week: Not on file    Minutes per session: Not on file  . Stress: Not on file  Relationships  . Social connections:    Talks on phone: Not on file    Gets together: Not on file    Attends religious service: Not on file    Active member of club or organization: Not on file    Attends meetings of clubs or organizations: Not on file    Relationship status: Not on file  . Intimate partner violence:    Fear of current or ex partner: Not on file    Emotionally abused: Not on file    Physically abused: Not on file    Forced sexual activity: Not on file  Other  Topics Concern  .  Not on file  Social History Narrative  . Not on file     Review of Systems: Review of Systems  Unable to perform ROS: Mental status change    Vital Signs: Blood pressure 108/68, pulse 88, temperature 98.5 F (36.9 C), temperature source Oral, resp. rate 14, height 5\' 5"  (1.651 m), weight 87.7 kg, SpO2 100 %.  Weight trends: Filed Weights   04/21/18 2250 04/22/18 0332 04/23/18 0500  Weight: 83.7 kg 83.7 kg 87.7 kg    Physical Exam: General: NAD, laying in bed  Head: Normocephalic, atraumatic. Moist oral mucosal membranes  Eyes: Anicteric, PERRL, opening eyes.   Neck: Supple, trachea midline  Lungs:  Clear to auscultation  Heart: Regular rate and rhythm  Abdomen:  Soft, nontender,   Extremities: No peripheral edema.  Neurologic: Answering yes and no questions  Skin: No lesions         Lab results: Basic Metabolic Panel: Recent Labs  Lab 04/18/18 2314  04/19/18 0600 04/20/18 0642 04/21/18 0411 04/21/18 1820 04/22/18 0459 04/23/18 0608  NA 139   < >  --  137 140 140 140 143  K 3.3*   < >  --  3.7 4.3 4.7 4.8 4.5  CL 104   < >  --  103 109 108 108 110  CO2 25   < >  --  20* 23 23 21* 20*  GLUCOSE 153*   < >  --  327* 183* 150* 153* 146*  BUN 17   < >  --  36* 37* 47* 51* 56*  CREATININE 1.30*   < >  --  1.73* 1.49* 1.98* 2.34* 2.02*  CALCIUM 8.3*   < >  --  8.3* 8.2* 7.9* 7.7* 8.2*  MG 2.6*  --  2.5* 2.3  --   --  2.2 2.1  PHOS  --   --   --  1.6* 3.1  --  4.1 3.6   < > = values in this interval not displayed.    Liver Function Tests: Recent Labs  Lab 04/18/18 1208 04/23/18 0608  AST 37 112*  ALT 22 93*  ALKPHOS 46 42  BILITOT 1.1 0.7  PROT 7.3 5.3*  ALBUMIN 4.0 2.6*   Recent Labs  Lab 04/18/18 1208  LIPASE 17   Recent Labs  Lab 04/18/18 1755  AMMONIA 12    CBC: Recent Labs  Lab 04/18/18 1208 04/19/18 0406 04/20/18 0642 04/21/18 0411 04/22/18 0459 04/23/18 0608  WBC 16.3* 19.5* 28.8* 22.1* 13.2* 12.2*  NEUTROABS 15.1*  --   --    --   --  10.1*  HGB 13.5 11.8* 11.3* 11.2* 10.6* 10.9*  HCT 42.2 37.0 34.8* 34.3* 32.6* 33.4*  MCV 77.8* 77.1* 77.3* 77.4* 78.1* 76.8*  PLT 200 191 177 168 138* 154    Cardiac Enzymes: Recent Labs  Lab 04/18/18 1208 04/18/18 1755 04/18/18 2044 04/19/18 0349 04/19/18 0600  CKTOTAL 161  --   --   --   --   TROPONINI 0.03* 0.06* 0.06* 0.28* 0.21*    BNP: Invalid input(s): POCBNP  CBG: Recent Labs  Lab 04/22/18 1820 04/22/18 1932 04/22/18 2346 04/23/18 0405 04/23/18 0725  GLUCAP 116* 130* 140* 115* 135*    Microbiology: Results for orders placed or performed during the hospital encounter of 04/18/18  Urine culture     Status: None   Collection Time: 04/18/18 12:08 PM  Result Value Ref Range Status   Specimen Description  Final    URINE, RANDOM Performed at [facility], [address] [place], [place] [address]    Special Requests   Final    NONE Performed at [facility], [address] [place], [place] [address]    Culture   Final    NO GROWTH Performed at [facility], [address] [place], [place] [address]    Report Status 04/19/2018 FINAL  Final  Blood Culture (routine x 2)     Status: None   Collection Time: 04/18/18  1:12 PM  Result Value Ref Range Status   Specimen Description BLOOD RIGHT HAND  Final   Special Requests   Final    BOTTLES DRAWN AEROBIC AND ANAEROBIC Blood Culture results may not be optimal due to an inadequate volume of blood received in culture bottles   Culture   Final    NO GROWTH 5 DAYS Performed at [facility], [address] [place], [place] [address]    Report Status 04/23/2018 FINAL  Final  Blood Culture (routine x 2)     Status: None   Collection Time: 04/18/18  1:20 PM  Result Value Ref Range Status   Specimen Description BLOOD RIGHT [facility]  Final   Special Requests   Final    BOTTLES DRAWN AEROBIC AND ANAEROBIC Blood Culture adequate volume   Culture   Final    NO GROWTH 5  DAYS Performed at [facility], [address] [place], [place] [address]    Report Status 04/23/2018 FINAL  Final  MRSA PCR Screening     Status: None   Collection Time: 04/18/18  5:19 PM  Result Value Ref Range Status   MRSA by PCR NEGATIVE NEGATIVE Final    Comment:        The GeneXpert MRSA Assay (FDA approved for NASAL specimens only), is one component of a comprehensive MRSA colonization surveillance program. It is not intended to diagnose MRSA infection nor to guide or monitor treatment for MRSA infections. Performed at [facility], [address] [place], [place] [address]     Coagulation Studies: No results for input(s): LABPROT, INR in the last 72 hours.  Urinalysis: No results for input(s): COLORURINE, LABSPEC, PHURINE, GLUCOSEU, HGBUR, BILIRUBINUR, KETONESUR, PROTEINUR, UROBILINOGEN, NITRITE, LEUKOCYTESUR in the last 72 hours.  Invalid input(s): APPERANCEUR    Imaging: US Renal  Result Date: 04/22/2018 CLINICAL DATA:  Acute kidney injury EXAM: RENAL / URINARY TRACT ULTRASOUND COMPLETE COMPARISON:  None. FINDINGS: Right Kidney: Length: 10.6 cm. Echogenicity within normal limits. No mass or hydronephrosis visualized. Left Kidney: Length: 8.5 cm. Echogenicity within normal limits. No mass or hydronephrosis visualized. Bladder: Appears normal for degree of bladder distention. IMPRESSION: 1. No acute renal abnormality. Asymmetrically smaller left kidney compared to the right without focal abnormality. Electronically Signed   By: Elige Ko   On: 04/22/2018 15:03   Dg Chest Port 1 View  Result Date: 04/23/2018 CLINICAL DATA:  Atelectasis EXAM: PORTABLE CHEST 1 VIEW COMPARISON:  04/19/2018 FINDINGS: Interval extubation. Mildly increased interstitial markings. Mild left basilar atelectasis. No pleural effusion or pneumothorax. The heart is normal in size. Right IJ venous catheter terminates at the cavoatrial junction. IMPRESSION: No evidence of acute  cardiopulmonary disease. Electronically Signed   By: Charline Bills M.D.   On: 04/23/2018 08:05      Assessment & Plan: Natalie Knight is a 82 y.o. black female with diabetes mellitus type II, dementia, anemia, hypertension, gout, DVT on  eliquis, restless leg syndrome, who was admitted to [facility] on 04/18/2018 for Acute respiratory failure (HCC) [J96.00] Status epilepticus (HCC) [G40.901] Meningitis [G03.9] Sepsis, due to unspecified organism (HCC) [A41.9]  1. Acute renal failure: baseline creatinine of 0.9, GFR > 60 on 02/10/18.  Acute renal failure secondary to IV contrast nephropathy, hypotension, vancomycin and sepsis Nonoliguric No indication for dialysis Continue IV fluids Holding losartan  2. Hypertension: blood pressure at goal, 130/60. Holding losartan   3. Diabetes mellitus type II with renal manifestations: holding metformin  4. Anemia: microcytic. Hemoglobin remains stable. 10.9  5. Altered mental status: improving Secondary to acute bacterial meningitis: empiric acyclovir, ampicillin and ceftriaxone.  Appreciate neurology input.    LOS: 5 Nithin Demeo 8/11/201910:03 AM

## 2018-04-23 NOTE — Progress Notes (Signed)
Pharmacy Electrolyte Monitoring Consult:  Pharmacy consulted to assist in monitoring and replacing electrolytes in this 82 y.o. female admitted on 04/18/2018 with Altered Mental Status   Labs:  Sodium (mmol/Knight)  Date Value  04/23/2018 143  10/03/2014 142   Potassium (mmol/Knight)  Date Value  04/23/2018 4.5  10/03/2014 3.6   Magnesium (mg/dL)  Date Value  40/98/119108/07/2018 2.1   Phosphorus (mg/dL)  Date Value  47/82/956208/07/2018 3.6   Calcium (mg/dL)  Date Value  13/08/657808/07/2018 8.2 (Knight)   Calcium, Total (mg/dL)  Date Value  46/96/295201/21/2016 9.3   Albumin (g/dL)  Date Value  84/13/244008/07/2018 2.6 (Knight)  10/01/2014 2.6 (Knight)   8/10:  SCr trending up and K trending up to 4.8. LR/7320mEq of Potassium @ 6775mL/hr.  Will take KCl out of maintenance fluids. Discussed with ICU MD.   Assessment/Plan: K 4.5, Phos 3.6, Mag 2.1 - No additional electrolyte replacement needed at this time.   Labs ordered for F/U on 8/12  Pharmacy will continue to monitor and adjust per consult.   Natalie Knight, Natalie Knight, PharmD, BCPS Clinical Pharmacist 04/23/2018 10:23 AM

## 2018-04-24 LAB — GLUCOSE, CAPILLARY
GLUCOSE-CAPILLARY: 132 mg/dL — AB (ref 70–99)
GLUCOSE-CAPILLARY: 163 mg/dL — AB (ref 70–99)
GLUCOSE-CAPILLARY: 161 mg/dL — AB (ref 70–99)
GLUCOSE-CAPILLARY: 134 mg/dL — AB (ref 70–99)
GLUCOSE-CAPILLARY: 233 mg/dL — AB (ref 70–99)
Glucose-Capillary: 156 mg/dL — ABNORMAL HIGH (ref 70–99)

## 2018-04-24 LAB — COMPREHENSIVE METABOLIC PANEL
ALBUMIN: 2.7 g/dL — AB (ref 3.5–5.0)
ALT: 94 U/L — ABNORMAL HIGH (ref 0–44)
ANION GAP: 12 (ref 5–15)
AST: 106 U/L — AB (ref 15–41)
Alkaline Phosphatase: 40 U/L (ref 38–126)
BUN: 47 mg/dL — AB (ref 8–23)
CHLORIDE: 112 mmol/L — AB (ref 98–111)
CO2: 19 mmol/L — ABNORMAL LOW (ref 22–32)
Calcium: 8.5 mg/dL — ABNORMAL LOW (ref 8.9–10.3)
Creatinine, Ser: 1.62 mg/dL — ABNORMAL HIGH (ref 0.44–1.00)
GFR calc Af Amer: 34 mL/min — ABNORMAL LOW (ref >60)
GFR, EST NON AFRICAN AMERICAN: 29 mL/min — AB (ref >60)
Glucose, Bld: 172 mg/dL — ABNORMAL HIGH (ref 70–99)
POTASSIUM: 4.3 mmol/L (ref 3.5–5.1)
Sodium: 143 mmol/L (ref 135–145)
Total Bilirubin: 0.9 mg/dL (ref 0.3–1.2)
Total Protein: 5.2 g/dL — ABNORMAL LOW (ref 6.5–8.1)

## 2018-04-24 LAB — APTT
APTT: 123 s — AB (ref 24–36)
aPTT: 109 seconds — ABNORMAL HIGH (ref 24–36)

## 2018-04-24 LAB — CBC WITH DIFFERENTIAL/PLATELET
BASOS ABS: 0 10*3/uL (ref 0–0.1)
BASOS PCT: 0 %
EOS ABS: 0.1 10*3/uL (ref 0–0.7)
Eosinophils Relative: 1 %
HEMATOCRIT: 26.6 % — AB (ref 35.0–47.0)
HEMOGLOBIN: 8.8 g/dL — AB (ref 12.0–16.0)
Lymphocytes Relative: 7 %
Lymphs Abs: 0.9 10*3/uL — ABNORMAL LOW (ref 1.0–3.6)
MCH: 25.3 pg — ABNORMAL LOW (ref 26.0–34.0)
MCHC: 33 g/dL (ref 32.0–36.0)
MCV: 76.7 fL — ABNORMAL LOW (ref 80.0–100.0)
MONOS PCT: 8 %
Monocytes Absolute: 1.1 10*3/uL — ABNORMAL HIGH (ref 0.2–0.9)
NEUTROS ABS: 10.5 10*3/uL — AB (ref 1.4–6.5)
NEUTROS PCT: 84 %
Platelets: 157 10*3/uL (ref 150–440)
RBC: 3.47 MIL/uL — AB (ref 3.80–5.20)
RDW: 14.9 % — ABNORMAL HIGH (ref 11.5–14.5)
WBC: 12.6 10*3/uL — AB (ref 3.6–11.0)

## 2018-04-24 LAB — HEPARIN LEVEL (UNFRACTIONATED)
HEPARIN UNFRACTIONATED: 0.9 [IU]/mL — AB (ref 0.30–0.70)
Heparin Unfractionated: 0.67 IU/mL (ref 0.30–0.70)

## 2018-04-24 LAB — MAGNESIUM: Magnesium: 2 mg/dL (ref 1.7–2.4)

## 2018-04-24 LAB — PHOSPHORUS: Phosphorus: 3.9 mg/dL (ref 2.5–4.6)

## 2018-04-24 MED ORDER — DEXTROSE 5 % IV SOLN
700.0000 mg | Freq: Two times a day (BID) | INTRAVENOUS | Status: DC
Start: 2018-04-24 — End: 2018-04-26
  Administered 2018-04-24 – 2018-04-26 (×4): 700 mg via INTRAVENOUS
  Filled 2018-04-24 (×5): qty 14

## 2018-04-24 MED ORDER — APIXABAN 5 MG PO TABS
5.0000 mg | ORAL_TABLET | Freq: Two times a day (BID) | ORAL | Status: DC
  Administered 2018-04-24 – 2018-04-25 (×2): 5 mg via ORAL
  Filled 2018-04-24 (×2): qty 1

## 2018-04-24 MED ORDER — VANCOMYCIN HCL IN DEXTROSE 750-5 MG/150ML-% IV SOLN
750.0000 mg | INTRAVENOUS | Status: DC
  Administered 2018-04-24 – 2018-04-25 (×2): 750 mg via INTRAVENOUS
  Filled 2018-04-24 (×3): qty 150

## 2018-04-24 MED ORDER — ENSURE ENLIVE PO LIQD
237.0000 mL | Freq: Three times a day (TID) | ORAL | Status: DC
  Administered 2018-04-26 – 2018-05-05 (×21): 237 mL via ORAL

## 2018-04-24 MED ORDER — ADULT MULTIVITAMIN W/MINERALS CH
1.0000 | ORAL_TABLET | Freq: Every day | ORAL | Status: DC
  Administered 2018-04-25 – 2018-05-06 (×11): 1 via ORAL
  Filled 2018-04-24 (×11): qty 1

## 2018-04-24 MED ORDER — ALBUTEROL SULFATE (2.5 MG/3ML) 0.083% IN NEBU: 2.5000 mg | INHALATION_SOLUTION | Freq: Four times a day (QID) | RESPIRATORY_TRACT | Status: DC | PRN

## 2018-04-24 MED ORDER — FREE WATER: 200.0000 mL | Freq: Three times a day (TID) | Status: DC

## 2018-04-24 NOTE — Progress Notes (Signed)
Patient with very poor PO intake post SLP eval.  Attempted several times this shift to feed pt/aminister fluids.  With vigorous stimulation and step by step direction she may take a bite or two, but refuses anymore, liquid intake very difficult.  Per her family she spits most out on her blanket when writing RN leaves the room.  Spoke with daughter Stark Kleinlsie re a feeding tube, she was quite hesitant but stated she would like to speak with Pall NP regarding goals of care for her mother.  Consult placed by NP Jeri ModenaJeremiah

## 2018-04-24 NOTE — Progress Notes (Signed)
CRITICAL CARE NOTE  CC  follow up suspected bacterial meningtitis  SUBJECTIVE  82 year old female who was found unresponsive at her home on 8/6. Admitted with suspected bacterial meningitis. Patient extubated this weekend and doing well on room air. However, patient continues to be significantly confused. Pending speech evaluation today.    BP 110/90   Pulse 80   Temp 97.7 F (36.5 C) (Axillary)   Resp 20   Ht 5\' 5"  (1.651 m)   Wt 87.2 kg   SpO2 98%   BMI 31.99 kg/m    REVIEW OF SYSTEMS  PATIENT IS UNABLE TO PROVIDE COMPLETE REVIEW OF SYSTEM S DUE TO  ENCEPHALOPATHY   PHYSICAL EXAMINATION:  GENERAL:elderly female who is resting in bed, -resp distress HEAD: Normocephalic, atraumatic.  EYES: Pupils equal, round, reactive to light.  No scleral icterus.  MOUTH: Moist mucosal membrane. NECK: Supple. No thyromegaly. No nodules. No JVD.  PULMONARY: clear to auscultation bilaterally, no wheezes/rales/rhonchi CARDIOVASCULAR: S1 and S2. Regular rate and rhythm. No murmurs, rubs, or gallops.  GASTROINTESTINAL: Soft, nontender, -distended. No masses. Positive bowel sounds. No hepatosplenomegaly.  MUSCULOSKELETAL: No swelling, clubbing, or edema.  NEUROLOGIC: oriented to self. Unable to follow verbal commands. 50% of speech is incomprehensible.  SKIN:intact,warm,dry  ASSESSMENT AND PLAN SYNOPSIS  82 year old female who was admitted on 8/6 after being found unresponsive. Treated for suspected bacterial meningitis with clinical improvement. Intubated for airway protection in the emergency department. Patient continues to be confused. SLP ordered dysphagia 1 diet.   Respiratory -intubated for airway protection, now extubated -no current concerns   Acute Renal Failure-resolving -creatinine 2.34-->2.02-->1.62 -likely secondary to vancomycin, hypotension, sepsis and IV contrast -Hold losartan, metformin -vancomycin discontinued -continue IV fluids--> LR -renal ultrasound  negative   NEUROLOGY - acute encephalopathy secondary to suspected bacterial meningitis  -no LP per neurology  -continue full 14 day course of ampicillin, ceftriaxone, and acyclovir  -oriented to self -per nursing report, there has been little improvement since extubation -consider repeat ammonia    Diabetes: -continue SSI -continue CBG monitoring   CARDIAC -ICU monitoring -on heparin drip for DVT prophylaxis  -consider switch to eliquis now that patient is extubated  ID -continue IV abx as prescribed: ampicillin, rocephin  -acyclovir  -dexamethasone course completed -leukocytosis improving   -speech evaluation today: dysphagia 1 diet ordered, thin liquids  DVT: heparin GI PRX: pepcid  TRANSFUSIONS AS NEEDED MONITOR FSBS ASSESS the need for LABS as needed   Critical Care Time devoted to patient care services described in this note is 32 minutes.

## 2018-04-24 NOTE — Progress Notes (Signed)
ANTICOAGULATION CONSULT NOTE  Pharmacy Consult for apixaban dosing Indication: History of DVT on apixaban as an outpatient   No Known Allergies  Patient Measurements: Height: 5\' 5"  (165.1 cm) Weight: 192 lb 3.9 oz (87.2 kg) IBW/kg (Calculated) : 57  Heparin DW 76 kg   Vital Signs: Temp: 98.1 F (36.7 C) (08/12 1200) Temp Source: Axillary (08/12 1200) BP: 106/55 (08/12 1400) Pulse Rate: 99 (08/12 1400)  Labs: Recent Labs    04/22/18 0459  04/23/18 0608 04/23/18 1447 04/24/18 0217 04/24/18 1109  HGB 10.6*  --  10.9*  --  8.8*  --   HCT 32.6*  --  33.4*  --  26.6*  --   PLT 138*  --  154  --  157  --   APTT 42*   < >  --  105* 123* 109*  HEPARINUNFRC 0.51   < >  --  0.88* 0.90* 0.67  CREATININE 2.34*  --  2.02*  --  1.62*  --    < > = values in this interval not displayed.    Estimated Creatinine Clearance: 30.7 mL/min (A) (by C-G formula based on SCr of 1.62 mg/dL (H)).   Assessment: Pharmacy consulted for apixaban dosing for 82 yo female admitted with meningitis.Patient previously on apixaban for left leg DVT in 02/2018. Patient extubated on 8/10 and is now able to   Goal of Therapy:  Monitor platelets by anticoagulation protocol: Yes   Plan:  Will continue patient on apixaban 5mg  BID. Will schedule first dose of apixaban for 1800. Instructed nurse to discontinue heparin drip at time of apixaban administration.   Pharmacy will continue to monitor and adjust per consult.   Tyonna Talerico L 04/24/2018 4:56 PM

## 2018-04-24 NOTE — Progress Notes (Signed)
PT Cancellation Note  Patient Details Name: Natalie Knight MRN: [id number] DOB: [birth year]   Cancelled Treatment:    Reason Eval/Treat Not Completed: Other (comment).  PT consult received.  Discussed pt with OT who attempted to see pt recently.  Per OT note:  "Pt sleeping, safety mitts on, mumbles occasionally to self, never opens eyes. Does not respond to verbal or tactile stimuli."  Will hold PT at this time and re-attempt PT evaluation at a later date/time when pt is more appropriate to participate in therapy.  Hendricks LimesEmily Craven Crean, PT 04/24/18, 4:27 PM [phone]

## 2018-04-24 NOTE — Progress Notes (Signed)
Subjective: Patient extubated.  Awake and alert.    Objective: Current vital signs: BP 107/60 (BP Location: Left Arm)   Pulse 92   Temp 98.1 F (36.7 C) (Axillary)   Resp 19   Ht 5\' 5"  (1.651 m)   Wt 87.2 kg   SpO2 99%   BMI 31.99 kg/m  Vital signs in last 24 hours: Temp:  [97.7 F (36.5 C)-98.9 F (37.2 C)] 98.1 F (36.7 C) (08/12 1200) Pulse Rate:  [77-92] 92 (08/12 1200) Resp:  [12-25] 19 (08/12 1200) BP: (83-141)/(46-100) 107/60 (08/12 1200) SpO2:  [96 %-100 %] 99 % (08/12 1200) Weight:  [87.2 kg] 87.2 kg (08/12 0400)  Intake/Output from previous day: 08/11 0701 - 08/12 0700 In: 2889.8 [I.V.:2075.8; IV Piggyback:814] Out: 1025 [Urine:1025] Intake/Output this shift: Total I/O In: 632.5 [I.V.:407.5; IV Piggyback:225] Out: 210 [Urine:210] Nutritional status:  Diet Order            DIET - DYS 1 Room service appropriate? Yes; Fluid consistency: Thin  Diet effective now              Neurologic Exam: Mental Status: Alert.  Unable to follow commands.  One to two words in speech.  The rest is unintelligible.   Cranial Nerves: II: Discs flat bilaterally; Blinks to bilateral confrontation III,IV, VI: ptosis not present, extra-ocular motions intact bilaterally V,VII: smile symmetric, facial light touch sensation normal bilaterally VIII: hearing normal bilaterally IX,X: gag reflex present XI: bilateral shoulder shrug XII: midline tongue extension Motor: Moves upper extremities against gravity.   Sensory: Pinprick and light touch intact throughout, bilaterally   Lab Results: Basic Metabolic Panel: Recent Labs  Lab 04/19/18 0600 04/20/18 0642 04/21/18 0411 04/21/18 1820 04/22/18 0459 04/23/18 0608 04/24/18 0217  NA  --  137 140 140 140 143 143  K  --  3.7 4.3 4.7 4.8 4.5 4.3  CL  --  103 109 108 108 110 112*  CO2  --  20* 23 23 21* 20* 19*  GLUCOSE  --  327* 183* 150* 153* 146* 172*  BUN  --  36* 37* 47* 51* 56* 47*  CREATININE  --  1.73* 1.49* 1.98*  2.34* 2.02* 1.62*  CALCIUM  --  8.3* 8.2* 7.9* 7.7* 8.2* 8.5*  MG 2.5* 2.3  --   --  2.2 2.1 2.0  PHOS  --  1.6* 3.1  --  4.1 3.6 3.9    Liver Function Tests: Recent Labs  Lab 04/18/18 1208 04/23/18 0608 04/24/18 0217  AST 37 112* 106*  ALT 22 93* 94*  ALKPHOS 46 42 40  BILITOT 1.1 0.7 0.9  PROT 7.3 5.3* 5.2*  ALBUMIN 4.0 2.6* 2.7*   Recent Labs  Lab 04/18/18 1208  LIPASE 17   Recent Labs  Lab 04/18/18 1755  AMMONIA 12    CBC: Recent Labs  Lab 04/18/18 1208  04/20/18 0642 04/21/18 0411 04/22/18 0459 04/23/18 0608 04/24/18 0217  WBC 16.3*   < > 28.8* 22.1* 13.2* 12.2* 12.6*  NEUTROABS 15.1*  --   --   --   --  10.1* 10.5*  HGB 13.5   < > 11.3* 11.2* 10.6* 10.9* 8.8*  HCT 42.2   < > 34.8* 34.3* 32.6* 33.4* 26.6*  MCV 77.8*   < > 77.3* 77.4* 78.1* 76.8* 76.7*  PLT 200   < > 177 168 138* 154 157   < > = values in this interval not displayed.    Cardiac Enzymes: Recent Labs  Lab 04/18/18  1208 04/18/18 1755 04/18/18 2044 04/19/18 0349 04/19/18 0600  CKTOTAL 161  --   --   --   --   TROPONINI 0.03* 0.06* 0.06* 0.28* 0.21*    Lipid Panel: No results for input(s): CHOL, TRIG, HDL, CHOLHDL, VLDL, LDLCALC in the last 168 hours.  CBG: Recent Labs  Lab 04/23/18 1621 04/23/18 2019 04/23/18 2327 04/24/18 0331 04/24/18 0728  GLUCAP 108* 141* 132* 156* 163*    Microbiology: Results for orders placed or performed during the hospital encounter of 04/18/18  Urine culture     Status: None   Collection Time: 04/18/18 12:08 PM  Result Value Ref Range Status   Specimen Description   Final    URINE, RANDOM Performed at [facility], [address] [place], [place] [address]    Special Requests   Final    NONE Performed at [facility], [address] [place], [place] [address]    Culture   Final    NO GROWTH Performed at [facility], [address] [place], [place] [address]    Report Status 04/19/2018 FINAL  Final   Blood Culture (routine x 2)     Status: None   Collection Time: 04/18/18  1:12 PM  Result Value Ref Range Status   Specimen Description BLOOD RIGHT HAND  Final   Special Requests   Final    BOTTLES DRAWN AEROBIC AND ANAEROBIC Blood Culture results may not be optimal due to an inadequate volume of blood received in culture bottles   Culture   Final    NO GROWTH 5 DAYS Performed at [facility], [address] [place], [place] [address]    Report Status 04/23/2018 FINAL  Final  Blood Culture (routine x 2)     Status: None   Collection Time: 04/18/18  1:20 PM  Result Value Ref Range Status   Specimen Description BLOOD RIGHT [facility]  Final   Special Requests   Final    BOTTLES DRAWN AEROBIC AND ANAEROBIC Blood Culture adequate volume   Culture   Final    NO GROWTH 5 DAYS Performed at [facility], [address] [place], [place] [address]    Report Status 04/23/2018 FINAL  Final  MRSA PCR Screening     Status: None   Collection Time: 04/18/18  5:19 PM  Result Value Ref Range Status   MRSA by PCR NEGATIVE NEGATIVE Final    Comment:        The GeneXpert MRSA Assay (FDA approved for NASAL specimens only), is one component of a comprehensive MRSA colonization surveillance program. It is not intended to diagnose MRSA infection nor to guide or monitor treatment for MRSA infections. Performed at [facility], [address] [place], [place] [address]     Coagulation Studies: No results for input(s): LABPROT, INR in the last 72 hours.  Imaging: Koreas Renal  Result Date: 04/22/2018 CLINICAL DATA:  Acute kidney injury EXAM: RENAL / URINARY TRACT ULTRASOUND COMPLETE COMPARISON:  None. FINDINGS: Right Kidney: Length: 10.6 cm. Echogenicity within normal limits. No mass or hydronephrosis visualized. Left Kidney: Length: 8.5 cm. Echogenicity within normal limits. No mass or hydronephrosis visualized. Bladder: Appears normal for degree of bladder distention.  IMPRESSION: 1. No acute renal abnormality. Asymmetrically smaller left kidney compared to the right without focal abnormality. Electronically Signed   By: Elige KoHetal  Patel   On: 04/22/2018 15:03   Dg Chest Port 1 View  Result Date: 04/23/2018 CLINICAL DATA:  Atelectasis EXAM: PORTABLE CHEST 1 VIEW COMPARISON:  04/19/2018 FINDINGS: Interval extubation. Mildly increased interstitial markings. Mild left basilar atelectasis. No pleural effusion or pneumothorax. The heart is normal in size. Right IJ venous catheter terminates at the cavoatrial junction. IMPRESSION: No evidence of acute cardiopulmonary disease. Electronically Signed   By: Charline Bills M.D.   On: 04/23/2018 08:05    Medications:  I have reviewed the patient's current medications. Scheduled: . albuterol  2.5 mg Nebulization Q6H  . chlorhexidine  15 mL Mouth Rinse BID  . feeding supplement (ENSURE ENLIVE)  237 mL Oral TID BM  . guaiFENesin-codeine  10 mL Oral Q6H  . insulin aspart  0-9 Units Subcutaneous Q4H  . mouth rinse  15 mL Mouth Rinse q12n4p  . senna-docusate  1 tablet Per Tube BID    Assessment/Plan: Patient improved but not at baseline mental status.  Due to severity of presentation, may not return to baseline.  Remains on broad spectrum antibiotics and acyclovir.  Recommendations: 1. Would continue antibiotic coverage for full 14 day course.   2. Will continue to follow prn   LOS: 6 days   Thana Farr, MD Neurology [phone] 04/24/2018  1:25 PM

## 2018-04-24 NOTE — Progress Notes (Signed)
OT Cancellation Note  Patient Details Name: Natalie Knight MRN: [id number] DOB: [birth year]   Cancelled Treatment:    Reason Eval/Treat Not Completed: Patient's level of consciousness;Fatigue/lethargy limiting ability to participate. Order received, chart reviewed. Pt sleeping, safety mitts on, mumbles occasionally to self, never opens eyes. Does not respond to verbal or tactile stimuli. Will re-attempt OT evaluation at later date/time as pt is more appropriate for participation in therapy.   Richrd PrimeJamie Stiller, MPH, MS, OTR/L ascom [phone] 04/24/18, 4:06 PM

## 2018-04-24 NOTE — Progress Notes (Signed)
   [employer] [facility] [place] [facility]   PATIENT NAME: Natalie Knight    MR#:  [id number]  DATE OF BIRTH:  [birth year]  SUBJECTIVE:  CHIEF COMPLAINT:   Chief Complaint  Patient presents with  . Altered Mental Status   Patient extubated but is still drowsy.  She opened eyes on stimuli. REVIEW OF SYSTEMS:  Review of Systems  Unable to perform ROS: Intubated    DRUG ALLERGIES:  No Known Allergies VITALS:  Blood pressure (!) 106/55, pulse 99, temperature 98.1 F (36.7 C), temperature source Axillary, resp. rate (!) 24, height 5\' 5"  (1.651 m), weight 87.2 kg, SpO2 100 %. PHYSICAL EXAMINATION:  Physical Exam  HENT:  Head: Normocephalic.  Eyes: No scleral icterus.  Bilateral pupils are pinpoint, not reactive to light.  Neck: Neck supple. No JVD present. No tracheal deviation present.  Cardiovascular: Normal rate, regular rhythm and normal heart sounds. Exam reveals no gallop.  No murmur heard. Pulmonary/Chest: Breath sounds normal. No respiratory distress. She has no wheezes. She has no rales.  Abdominal: Soft. Bowel sounds are normal. She exhibits no distension. There is no tenderness. There is no rebound.  Musculoskeletal: She exhibits no edema or tenderness.  Neurological:  Drowsy, not follow command, unable to exam.  Skin: No rash noted. No erythema.   LABORATORY PANEL:  Female CBC Recent Labs  Lab 04/24/18 0217  WBC 12.6*  HGB 8.8*  HCT 26.6*  PLT 157   ------------------------------------------------------------------------------------------------------------------ Chemistries  Recent Labs  Lab 04/24/18 0217  NA 143  K 4.3  CL 112*  CO2 19*  GLUCOSE 172*  BUN 47*  CREATININE 1.62*  CALCIUM 8.5*  MG 2.0  AST 106*  ALT 94*  ALKPHOS 40  BILITOT 0.9   RADIOLOGY:  No results found. ASSESSMENT AND PLAN:   Acute respiratory failure with hypoxia. Patient was extubated.  In room air.  *Acute sepsis Suspected due to acute bacterial  meningitis Continue ampicillin, Rocephin and acyclovir, dexamethasone IV,  MRI of the brain- no acute findings.  No indication for LP per Dr. Thad Rangereynolds.  Leukocytosis is improving.  Blood culture is negative so far.  Vancomycin was discontinued. Would continue antibiotic coverage for full 14 day course per Dr. Thad Rangereynolds.  *Acute comatose state/acute metabolic encephalopathy. Most likely secondary to above, MRI and EEG done. Avoid psychotropic meds, neurochecks per routine, aspiration/fall precautions,  negative RPR.  * Ac renal fialure, ATN   Likely due to Hypoperfusion secondary to hypotension, improving with IV fluids, Follow-up BMP.  * Hypotension.  Off Levophed drip.    Improved.  * Elevated troponin, possible due to demanding ischemia secondary to above.  * Lactic acidosis.  Due to above.  Improved.  *Chronic diabetes mellitus type 2 On sliding scale.  *Chronic benign essential hypertension Hold hypertension medication due to hypotension.  *Acute hypokalemia Repleted with IV potassium, improved.  *History of DVT Hold Eliquis for now, SCDs- heparine drip.  All the records are reviewed and case discussed with Care Management/Social Worker. Management plans discussed with the patient's daughter, and they are in agreement.  CODE STATUS: DNR  TOTAL TIME TAKING CARE OF THIS PATIENT: 36 minutes.    POSSIBLE D/C IN ? DAYS, DEPENDING ON CLINICAL CONDITION.   Shaune PollackQing Abhishek Levesque M.D on 04/24/2018 at 2:47 PM  Between 7am to 6pm - Pager - [phone]  After 6pm go to [email or web address] - Therapist, nutritionalpassword [email or web address]  [facility]

## 2018-04-24 NOTE — Evaluation (Signed)
Clinical/Bedside Swallow Evaluation Patient Details  Name: Natalie Knight MRN: [id number] Date of Birth: 1935/12/21  Today's Date: 04/24/2018 Time: SLP Start Time (ACUTE ONLY): 0850 SLP Stop Time (ACUTE ONLY): 0944 SLP Time Calculation (min) (ACUTE ONLY): 54 min  Past Medical History:  Past Medical History:  Diagnosis Date  . Diabetes mellitus without complication (HCC)   . Hypertension    Past Surgical History: History reviewed. No pertinent surgical history. HPI:  82 year old female with PMHx of DM, HTN who is admitted after being found unresponsive by friends, was intubated on 8/6 for airway protection, with suspected bacterial meningitis vs CVA. Patient extubated 04/22/2018.   Assessment / Plan / Recommendation Clinical Impression  82 year old woman admitted 04/18/18 having been found unresponsive.  Patient was intubated and sedated; extubated 04/23/18 PM.  Patient seen at bedside and appears drowsy.  Patient is verbally responsive to voice; answers questions and follows simple commands.  Patient is poorly oriented to swallowing, but was able to take ice chips from a spoon with verbal cues- demonstrating rotary chewing with swallowing.  Patient able to pull water via straw with verbal and tactile cues.  Patient able to take puree from spoon with verbal cues to orient her to task.  There are no clinical indicators of aspiration - no cough, throat clear, or vocal quality change across all textures.  The patient stated "no more- it doesn't take much to fill me up".  She had taken 8 ounces of water and 2 ounces of applesauce.  The patient then had vigorous coughing.  Recommend initiating dysphagia 1 diet with thin liquids and monitor for tolerance.  Recommend stopping PO intake if patient indicates that she is full. SLP Visit Diagnosis: Attention and concentration deficit;Dysphagia, oropharyngeal phase (R13.12)    Aspiration Risk  Mild aspiration risk    Diet Recommendation Dysphagia 1  (Puree);Thin liquid   Liquid Administration via: Straw Medication Administration: Crushed with puree Supervision: Staff to assist with self feeding Compensations: (verbal cues to orient to eating)    Other  Recommendations     Follow up Recommendations        Frequency and Duration            Prognosis Prognosis for Safe Diet Advancement: Good Barriers to Reach Goals: Cognitive deficits      Swallow Study   General Date of Onset: 04/18/18 HPI: 82 year old female with PMHx of DM, HTN who is admitted after being found unresponsive by friends, was intubated on 8/6 for airway protection, with suspected bacterial meningitis vs CVA.  Type of Study: Bedside Swallow Evaluation Diet Prior to this Study: NPO Behavior/Cognition: Lethargic/Drowsy;Confused Self-Feeding Abilities: Total assist Patient Positioning: Upright in bed Baseline Vocal Quality: Normal    Oral/Motor/Sensory Function Overall Oral Motor/Sensory Function: Generalized oral weakness   Ice Chips Ice chips: Within functional limits Presentation: Spoon   Thin Liquid Thin Liquid: Within functional limits Presentation: Spoon;Straw    Nectar Thick     Honey Thick     Puree Puree: Within functional limits Presentation: Spoon   Solid           Dollene PrimroseSusan G Maple Odaniel, MS/CCC- SLP  Tedd SiasAbernathy, Lynnell DikeSusie 04/24/2018,9:45 AM

## 2018-04-24 NOTE — Progress Notes (Signed)
Palliative Medicine Consult Order Noted. Due to high referral volume and limited staffing, there will be a delay seeing this patient. Palliative Medicine Provider is expected to see this patient 04/25/18. Please call the Palliative Medicine Team office at [phone] if recommendations are needed in the interim.  Thank you for inviting us to see this patient.  Camerin Jimenez G. Kristia Jupiter, RN, BSN, CHPN Palliative Medicine Team 04/24/2018 3:26 PM Office [phone] 

## 2018-04-24 NOTE — Progress Notes (Signed)
Nutrition Follow-up  DOCUMENTATION CODES:   Not applicable  INTERVENTION:  Provide Ensure Enlive po TID, each supplement provides 350 kcal and 20 grams of protein.  Provide daily MVI.  NUTRITION DIAGNOSIS:   Inadequate oral intake related to inability to eat as evidenced by NPO status.  Inadequate intake ongoing although diet has been advanced.  GOAL:   Patient will meet greater than or equal to 90% of their needs  Progressing.  MONITOR:   Vent status, Labs, Weight trends, TF tolerance, I & O's  REASON FOR ASSESSMENT:   Ventilator    ASSESSMENT:   82 year old female with PMHx of DM, HTN who is admitted after being found unresponsive by friends, was intubated on 8/6 for airway protection, with suspected bacterial meningitis vs CVA.   -No acute findings on MRI of brain 8/7. -Patient was extubated on 8/10. -Following SLP evaluation today diet was advanced to dysphagia 1 with thin liquids.  Met with patient and family member at bedside. Patient very confused. Family member reports she only had bites of breakfast this AM. She also reports patient had a decrease PO intake PTA.  Medications reviewed and include: Novolog 0-9 units Q4hrs, senna-docusate 1 tablet BID, acyclovir, ampicillin, ceftriaxone, famotidine, heparin gtt, LR @ 75 mL/hr.  Labs reviewed: CBG 108-163, Chloride 112, CO2 19, BUN 47, Creatinine 1.62.  I/O: 1025 mL UOP yesterday (0.5 mL/kg/hr); 2 BM yesterday  Weight trend: 87.2 kg on 8/12; +18.1 kg from admission  Discussed with RN and on rounds.  Diet Order:   Diet Order            DIET - DYS 1 Room service appropriate? Yes; Fluid consistency: Thin  Diet effective now              EDUCATION NEEDS:   No education needs have been identified at this time  Skin:  Skin Assessment: Reviewed RN Assessment  Last BM:  04/23/2018 - medium type 6  Height:   Ht Readings from Last 1 Encounters:  04/18/18 _0  (1.651 m)    Weight:   Wt Readings  from Last 1 Encounters:  04/24/18 87.2 kg    Ideal Body Weight:  56.8 kg  BMI:  Body mass index is 31.99 kg/m.  Estimated Nutritional Needs:   Kcal:  1400-1640 (MSJ x 1.2-1.4)  Protein:  80-95 grams (1.2-1.4 grams/kg)  Fluid:  1.7 L/day (25 mL/kg)  Willey Blade, MS, RD, LDN Office: [phone] Pager: [phone] After Hours/Weekend Pager: [phone]

## 2018-04-24 NOTE — [place] Notes (Signed)
[facility] [place], [place] 04/24/18  Subjective:   Serum creatinine improved to 1.62 today Urine output also improved to greater than 1000 cc   Objective:  Vital signs in last 24 hours:  Temp:  [97.7 F (36.5 C)-99 F (37.2 C)] 98.5 F (36.9 C) (08/12 0800) Pulse Rate:  [77-93] 77 (08/12 0800) Resp:  [12-21] 14 (08/12 0800) BP: (83-141)/(44-103) 118/50 (08/12 0800) SpO2:  [96 %-100 %] 100 % (08/12 0800) Weight:  [87.2 kg] 87.2 kg (08/12 0400)  Weight change: -0.5 kg Filed Weights   04/22/18 0332 04/23/18 0500 04/24/18 0400  Weight: 83.7 kg 87.7 kg 87.2 kg    Intake/Output:    Intake/Output Summary (Last 24 hours) at 04/24/2018 [id number] Last data filed at 04/24/2018 0800 Gross per 24 hour  Intake 2720.76 ml  Output 1135 ml  Net 1585.76 ml     Physical Exam: General:  Laying in the bed, no acute distress  HEENT  eyes, dry  Neck  no JVD  Pulm/lungs  coarse breath sounds, normal breathing effort, room air  CVS/Heart  irregular, tachycardic  Abdomen:   Soft, nontender  Extremities:  SCDs in place, nonsense edema bilaterally  Neurologic:  Follows few commands but not consistently  Skin:  No acute rashes    Foley in place       Basic Metabolic Panel:  Recent Labs  Lab 04/19/18 0600 04/20/18 0642 04/21/18 0411 04/21/18 1820 04/22/18 0459 04/23/18 0608 04/24/18 0217  NA  --  137 140 140 140 143 143  K  --  3.7 4.3 4.7 4.8 4.5 4.3  CL  --  103 109 108 108 110 112*  CO2  --  20* 23 23 21* 20* 19*  GLUCOSE  --  327* 183* 150* 153* 146* 172*  BUN  --  36* 37* 47* 51* 56* 47*  CREATININE  --  1.73* 1.49* 1.98* 2.34* 2.02* 1.62*  CALCIUM  --  8.3* 8.2* 7.9* 7.7* 8.2* 8.5*  MG 2.5* 2.3  --   --  2.2 2.1 2.0  PHOS  --  1.6* 3.1  --  4.1 3.6 3.9     CBC: Recent Labs  Lab 04/18/18 1208  04/20/18 0642 04/21/18 0411 04/22/18 0459 04/23/18 0608 04/24/18 0217  WBC 16.3*   < > 28.8* 22.1* 13.2* 12.2* 12.6*  NEUTROABS 15.1*  --   --   --    --  10.1* 10.5*  HGB 13.5   < > 11.3* 11.2* 10.6* 10.9* 8.8*  HCT 42.2   < > 34.8* 34.3* 32.6* 33.4* 26.6*  MCV 77.8*   < > 77.3* 77.4* 78.1* 76.8* 76.7*  PLT 200   < > 177 168 138* 154 157   < > = values in this interval not displayed.     No results found for: HEPBSAG, HEPBSAB, HEPBIGM    Microbiology:  Recent Results (from the past 240 hour(s))  Urine culture     Status: None   Collection Time: 04/18/18 12:08 PM  Result Value Ref Range Status   Specimen Description   Final    URINE, RANDOM Performed at [facility], [address] [place], [place] [address]    Special Requests   Final    NONE Performed at [facility], [address] [place], [place] [address]    Culture   Final    NO GROWTH Performed at [facility], [address] [place], [place] [address]    Report Status  04/19/2018 FINAL  Final  Blood Culture (routine x 2)     Status: None   Collection Time: 04/18/18  1:12 PM  Result Value Ref Range Status   Specimen Description BLOOD RIGHT HAND  Final   Special Requests   Final    BOTTLES DRAWN AEROBIC AND ANAEROBIC Blood Culture results may not be optimal due to an inadequate volume of blood received in culture bottles   Culture   Final    NO GROWTH 5 DAYS Performed at [facility], [address] [place], [place] [address]    Report Status 04/23/2018 FINAL  Final  Blood Culture (routine x 2)     Status: None   Collection Time: 04/18/18  1:20 PM  Result Value Ref Range Status   Specimen Description BLOOD RIGHT [facility]  Final   Special Requests   Final    BOTTLES DRAWN AEROBIC AND ANAEROBIC Blood Culture adequate volume   Culture   Final    NO GROWTH 5 DAYS Performed at [facility], [address] [place], [place] [address]    Report Status 04/23/2018 FINAL  Final  MRSA PCR Screening     Status: None   Collection Time: 04/18/18  5:19 PM  Result Value Ref Range Status   MRSA by PCR NEGATIVE NEGATIVE Final     Comment:        The GeneXpert MRSA Assay (FDA approved for NASAL specimens only), is one component of a comprehensive MRSA colonization surveillance program. It is not intended to diagnose MRSA infection nor to guide or monitor treatment for MRSA infections. Performed at [facility], [address] [place], [place] [address]     Coagulation Studies: No results for input(s): LABPROT, INR in the last 72 hours.  Urinalysis: No results for input(s): COLORURINE, LABSPEC, PHURINE, GLUCOSEU, HGBUR, BILIRUBINUR, KETONESUR, PROTEINUR, UROBILINOGEN, NITRITE, LEUKOCYTESUR in the last 72 hours.  Invalid input(s): APPERANCEUR    Imaging: US Renal  Result Date: 04/22/2018 CLINICAL DATA:  Acute kidney injury EXAM: RENAL / URINARY TRACT ULTRASOUND COMPLETE COMPARISON:  None. FINDINGS: Right Kidney: Length: 10.6 cm. Echogenicity within normal limits. No mass or hydronephrosis visualized. Left Kidney: Length: 8.5 cm. Echogenicity within normal limits. No mass or hydronephrosis visualized. Bladder: Appears normal for degree of bladder distention. IMPRESSION: 1. No acute renal abnormality. Asymmetrically smaller left kidney compared to the right without focal abnormality. Electronically Signed   By: Elige Ko   On: 04/22/2018 15:03   Dg Chest Port 1 View  Result Date: 04/23/2018 CLINICAL DATA:  Atelectasis EXAM: PORTABLE CHEST 1 VIEW COMPARISON:  04/19/2018 FINDINGS: Interval extubation. Mildly increased interstitial markings. Mild left basilar atelectasis. No pleural effusion or pneumothorax. The heart is normal in size. Right IJ venous catheter terminates at the cavoatrial junction. IMPRESSION: No evidence of acute cardiopulmonary disease. Electronically Signed   By: Charline Bills M.D.   On: 04/23/2018 08:05     Medications:   . sodium chloride    . acyclovir Stopped (04/23/18 1215)  . ampicillin (OMNIPEN) IV Stopped (04/24/18 9147)  . cefTRIAXone (ROCEPHIN)  IV Stopped  (04/24/18 0553)  . famotidine (PEPCID) IV Stopped (04/23/18 2241)  . heparin 650 Units/hr (04/24/18 0800)  . lactated ringers 75 mL/hr at 04/24/18 0800  . norepinephrine (LEVOPHED) 16mg  / infusion Stopped (04/21/18 0720)   . albuterol  2.5 mg Nebulization Q6H  . chlorhexidine  15 mL Mouth Rinse BID  . guaiFENesin-codeine  10 mL Oral Q6H  . insulin aspart  0-9 Units Subcutaneous Q4H  . mouth rinse  15 mL Mouth Rinse q12n4p  . senna-docusate  1 tablet Per Tube BID   sodium chloride, acetaminophen, hydrALAZINE, ondansetron (ZOFRAN) IV  Assessment/ Plan:  82 y.o. female with diabetes mellitus type II, dementia, anemia, hypertension, gout, DVT on eliquis, restless leg syndrome, who was admitted to [facility] on 04/18/2018 for Acute respiratory failure (HCC) [J96.00] Status epilepticus (HCC) [G40.901] Meningitis [G03.9] Sepsis, due to unspecified organism (HCC) [A41.9]  1. Acute renal failure: baseline creatinine of 0.9, GFR > 60 on 02/10/18. (Care everywhere) Acute renal failure secondary to IV contrast nephropathy, hypotension, vancomycin and sepsis UOP and S Creatinine are improving  continue supportive care  2.  Sepsis and altered mental status: improving. Suspected acute bacterial meningitis Treated with acyclovir, ampicillin and ceftriaxone.  Followed by neurologist Does adjust medications based on renal function in collaboration with pharmacy   LOS: 6 Chriss Redel Thedore MinsSingh 8/12/20199:18 AM  [facility] [place], [place] [phone]  Note: This note was prepared with Dragon dictation. Any transcription errors are unintentional

## 2018-04-24 NOTE — [place] Notes (Signed)
Pharmacy Antibiotic Note  Natalie Knight is a 82 y.o. female admitted on 04/18/2018 with meningitis.  Pharmacy has been consulted for vancomycin, ceftriaxone and ampicillin dosing. She has a known history which includes anemia chronic disease, dementia, gout, DVT on Eliquis presenting from her apartment, found unresponsive in the bed, patient nonverbal/not following commands/no eye-opening, family member started CPR, EMS brought to the emergency room, patient noted to be comatose then intubated.   Plan: Will resume vancomycin 750 mg iv q 24 hours and follow renal function closely. Levels as clinically indicated.   Continue Ampicillin 2g IV Q6hr.   Increase acyclovir to 700 mg iv q 12 hours with ARF resolving.   Continue Ceftriaxone 2g IV Q12hr.      Height: 5\' 5"  (165.1 cm) Weight: 192 lb 3.9 oz (87.2 kg) IBW/kg (Calculated) : 57  Temp (24hrs), Avg:98.4 F (36.9 C), Min:97.7 F (36.5 C), Max:98.9 F (37.2 C)  Recent Labs  Lab 04/18/18 1527 04/18/18 1755 04/18/18 2041  04/20/18 0642 04/20/18 1734 04/21/18 0411 04/21/18 1820 04/21/18 1936 04/22/18 0459 04/23/18 0608 04/24/18 0217  WBC  --   --   --    < > 28.8*  --  22.1*  --   --  13.2* 12.2* 12.6*  CREATININE  --   --   --    < > 1.73*  --  1.49* 1.98*  --  2.34* 2.02* 1.62*  LATICACIDVEN 3.4* 3.2* 3.4*  --   --   --   --   --   --   --  1.7  --   VANCOTROUGH  --   --   --   --   --   --   --   --  26*  --   --   --   VANCORANDOM  --   --   --   --   --  19  --   --   --   --   --   --    < > = values in this interval not displayed.    No Known Allergies  Antimicrobials this admission: Cefepime 8/6 x1  Ampicillin 8/6 >>  Vancomycin 8/6 >>  Ceftriaxone 8/6>> Acyclovir 8/6 >>  Microbiology results: 8/6 BCx: NG  8/6 UCx: NG  8/6 MRSA PCR: negative   Thank you for allowing pharmacy to be a part of this patient's care.  Valentina Guhristy, Natalie Knight D, PharmD, BCPS Clinical Pharmacist 04/24/2018 4:10 PM

## 2018-04-24 NOTE — [place] Notes (Addendum)
ANTICOAGULATION CONSULT NOTE  Pharmacy Consult for heparin drip management  Indication: DVT  No Known Allergies  Patient Measurements: Height: 5\' 5"  (165.1 cm) Weight: 193 lb 5.5 oz (87.7 kg) IBW/kg (Calculated) : 57  Heparin DW 76 kg   Vital Signs: Temp: 98.9 F (37.2 C) (08/12 0000) Temp Source: Axillary (08/12 0000) BP: 109/97 (08/12 0200) Pulse Rate: 79 (08/12 0200)  Labs: Recent Labs    04/22/18 0459  04/23/18 0547 04/23/18 0608 04/23/18 1447 04/24/18 0217  HGB 10.6*  --   --  10.9*  --  8.8*  HCT 32.6*  --   --  33.4*  --  26.6*  PLT 138*  --   --  154  --  157  APTT 42*   < > 89*  --  105* 123*  HEPARINUNFRC 0.51  --  0.84*  --  0.88* 0.90*  CREATININE 2.34*  --   --  2.02*  --  1.62*   < > = values in this interval not displayed.    Estimated Creatinine Clearance: 30.8 mL/min (A) (by C-G formula based on SCr of 1.62 mg/dL (H)).   Assessment: Pharmacy consulted for heparin drip management for 82 yo female admitted with meningitis and requiring mechanical ventilation. Patient previously on apixaban for left leg DVT in 02/2018. Heparin currently infusing at 550units/hr.  8/8 AM aPTT slightly subratherapeutic with a therapeutic HL. Hgb/Hct/Plts remains stable. Will increase the infusion to 600units/hr and recheck aPTT and HL in 8 hours.   8/9 AM aPTT 65, heparin level 0.74. Continue current regimen. Recheck aPTT, heparin level, and CBC with tomorrow AM labs.  8/10 AM aPTT 42, heparin level 0.51. Increase rate to 700 units/hr and recheck aPTT in 6 hours.  8/10 1200 aPTT 52.  Per RN, no s/sx of bleeding noted; RN confirms heparin running at 7 ml/hr.  Increase rate to 800 units/hr and recheck aPTT in 6 hours. CBC and HL in AM.  Discussed plt trend with ICU MD - plt trending down from 200 to 138 over 5 days. Per MD, ok to continue heparin as long as plt count is >90, not that significant of drop at this point.   8/10 2101 aPTT 64 seconds. Increase rate to 850  units/hr and recheck aPTT with heparin level in the morning  8/11 AM aPTT 89, heparin level 0.84. Continue current regimen. Recheck heparin level, aPTT, and CBC with tomorrow AM labs  Goal of Therapy:  Heparin level 0.3-0.7 units/ml aPTT 66-102 seconds Monitor platelets by anticoagulation protocol: Yes   Plan:  8/11 1447  aPTT 105, heparin level 0.88.  Both levels are high, finally correlating?? Will decrease heparin drip to 800 units/hr. Recheck HL and APTT 8h after rate change. Recheck CBC with tomorrow AM labs. Informed RN of plan, RN confirms heparin drip has been running at 8.5 ml/hr and no s/sx of bleeding noted.   8/12 0230 heparin level 0.9, APTT 123. Decrease to 650 units/hr and recheck aPTT and heparin level in 8 hours. Plan to follow by heparin levels if labs correlate.  Pharmacy will continue to monitor and adjust per consult.   Erich MontaneMcBane,Coden Franchi S, PharmD Clinical Pharmacist 04/24/2018 2:54 AM

## 2018-04-24 NOTE — [place] Notes (Signed)
Pharmacy Electrolyte Monitoring Consult:  Pharmacy consulted to assist in monitoring and replacing electrolytes in this 82 y.o. female admitted on 04/18/2018 with Altered Mental Status  MIVF: LR/3020mEq of Potassium @ 7375mL/hr.   Labs:  Sodium (mmol/L)  Date Value  04/24/2018 143  10/03/2014 142   Potassium (mmol/L)  Date Value  04/24/2018 4.3  10/03/2014 3.6   Magnesium (mg/dL)  Date Value  78/29/562108/08/2018 2.0   Phosphorus (mg/dL)  Date Value  30/86/578408/08/2018 3.9   Calcium (mg/dL)  Date Value  69/62/952808/08/2018 8.5 (L)   Calcium, Total (mg/dL)  Date Value  41/32/440101/21/2016 9.3   Albumin (g/dL)  Date Value  02/72/536608/08/2018 2.7 (L)  10/01/2014 2.6 (L)    Assessment/Plan: No additional electrolyte replacement needed at this time.   Will continue to follow daily labs due to possible refeeding risk.   Valentina Guhristy, Fionna Merriott D, PharmD, BCPS Clinical Pharmacist 04/24/2018 4:04 PM

## 2018-04-24 NOTE — [place] Notes (Signed)
Patient continues to be confused but has been on RA since extubation and tolerating well. No concerns at this time.

## 2018-04-24 NOTE — [place] Notes (Signed)
ANTICOAGULATION CONSULT NOTE  Pharmacy Consult for heparin drip management  Indication: DVT  No Known Allergies  Patient Measurements: Height: 5\' 5"  (165.1 cm) Weight: 192 lb 3.9 oz (87.2 kg) IBW/kg (Calculated) : 57  Heparin DW 76 kg   Vital Signs: Temp: 98.1 F (36.7 C) (08/12 1200) Temp Source: Axillary (08/12 1200) BP: 106/55 (08/12 1400) Pulse Rate: 99 (08/12 1400)  Labs: Recent Labs    04/22/18 0459  04/23/18 0608 04/23/18 1447 04/24/18 0217 04/24/18 1109  HGB 10.6*  --  10.9*  --  8.8*  --   HCT 32.6*  --  33.4*  --  26.6*  --   PLT 138*  --  154  --  157  --   APTT 42*   < >  --  105* 123* 109*  HEPARINUNFRC 0.51   < >  --  0.88* 0.90* 0.67  CREATININE 2.34*  --  2.02*  --  1.62*  --    < > = values in this interval not displayed.    Estimated Creatinine Clearance: 30.7 mL/min (A) (by C-G formula based on SCr of 1.62 mg/dL (H)).   Assessment: Pharmacy consulted for heparin drip management for 82 yo female admitted with meningitis and requiring mechanical ventilation. Patient previously on apixaban for left leg DVT in 02/2018.     Goal of Therapy:  Heparin level 0.3-0.7 units/ml Monitor platelets by anticoagulation protocol: Yes   Plan:  Continue heparin infusion at current rate and recheck a HL in 8 hours.   Pharmacy will continue to monitor and adjust per consult.   Luisa Harthristy, Mayara Paulson D, PharmD Clinical Pharmacist 04/24/2018 4:02 PM

## 2018-04-25 DIAGNOSIS — D62 Acute posthemorrhagic anemia: Secondary | ICD-10-CM

## 2018-04-25 DIAGNOSIS — G9340 Encephalopathy, unspecified: Secondary | ICD-10-CM

## 2018-04-25 DIAGNOSIS — E1165 Type 2 diabetes mellitus with hyperglycemia: Secondary | ICD-10-CM

## 2018-04-25 LAB — PHOSPHORUS: PHOSPHORUS: 4.2 mg/dL (ref 2.5–4.6)

## 2018-04-25 LAB — CBC
HCT: 15.3 % — ABNORMAL LOW (ref 35.0–47.0)
Hemoglobin: 5.1 g/dL — ABNORMAL LOW (ref 12.0–16.0)
MCH: 25.9 pg — ABNORMAL LOW (ref 26.0–34.0)
MCHC: 33.5 g/dL (ref 32.0–36.0)
MCV: 77.2 fL — ABNORMAL LOW (ref 80.0–100.0)
PLATELETS: 139 10*3/uL — AB (ref 150–440)
RBC: 1.98 MIL/uL — ABNORMAL LOW (ref 3.80–5.20)
RDW: 14.7 % — ABNORMAL HIGH (ref 11.5–14.5)
WBC: 8.9 10*3/uL (ref 3.6–11.0)

## 2018-04-25 LAB — BASIC METABOLIC PANEL
Anion gap: 5 (ref 5–15)
BUN: 42 mg/dL — ABNORMAL HIGH (ref 8–23)
CO2: 24 mmol/L (ref 22–32)
Calcium: 8.8 mg/dL — ABNORMAL LOW (ref 8.9–10.3)
Chloride: 113 mmol/L — ABNORMAL HIGH (ref 98–111)
Creatinine, Ser: 1.43 mg/dL — ABNORMAL HIGH (ref 0.44–1.00)
GFR, EST AFRICAN AMERICAN: 39 mL/min — AB (ref >60)
GFR, EST NON AFRICAN AMERICAN: 34 mL/min — AB (ref >60)
Glucose, Bld: 233 mg/dL — ABNORMAL HIGH (ref 70–99)
Potassium: 3.9 mmol/L (ref 3.5–5.1)
Sodium: 142 mmol/L (ref 135–145)

## 2018-04-25 LAB — HEMOGLOBIN AND HEMATOCRIT, BLOOD
HCT: 14.7 % — CL (ref 35.0–47.0)
HEMOGLOBIN: 4.9 g/dL — AB (ref 12.0–16.0)

## 2018-04-25 LAB — GLUCOSE, CAPILLARY
GLUCOSE-CAPILLARY: 183 mg/dL — AB (ref 70–99)
GLUCOSE-CAPILLARY: 166 mg/dL — AB (ref 70–99)
Glucose-Capillary: 149 mg/dL — ABNORMAL HIGH (ref 70–99)
Glucose-Capillary: 156 mg/dL — ABNORMAL HIGH (ref 70–99)
Glucose-Capillary: 195 mg/dL — ABNORMAL HIGH (ref 70–99)
Glucose-Capillary: 205 mg/dL — ABNORMAL HIGH (ref 70–99)
Glucose-Capillary: 235 mg/dL — ABNORMAL HIGH (ref 70–99)

## 2018-04-25 LAB — CALCIUM, IONIZED
CALCIUM, IONIZED, SERUM: 4.6 mg/dL (ref 4.5–5.6)
Calcium, Ionized, Serum: 4.7 mg/dL (ref 4.5–5.6)

## 2018-04-25 LAB — MAGNESIUM: MAGNESIUM: 1.9 mg/dL (ref 1.7–2.4)

## 2018-04-25 LAB — ABO/RH: ABO/RH(D): O POS

## 2018-04-25 LAB — PREPARE RBC (CROSSMATCH)

## 2018-04-25 MED ORDER — INSULIN ASPART 100 UNIT/ML ~~LOC~~ SOLN
0.0000 [IU] | Freq: Three times a day (TID) | SUBCUTANEOUS | Status: DC
  Administered 2018-04-25 (×2): 3 [IU] via SUBCUTANEOUS
  Administered 2018-04-26: 5 [IU] via SUBCUTANEOUS
  Administered 2018-04-26: 8 [IU] via SUBCUTANEOUS
  Administered 2018-04-26: 3 [IU] via SUBCUTANEOUS
  Administered 2018-04-27 (×2): 8 [IU] via SUBCUTANEOUS
  Administered 2018-04-27: 5 [IU] via SUBCUTANEOUS
  Administered 2018-04-28 (×3): 3 [IU] via SUBCUTANEOUS
  Administered 2018-04-29: 5 [IU] via SUBCUTANEOUS
  Administered 2018-04-29: 3 [IU] via SUBCUTANEOUS
  Administered 2018-04-29: 5 [IU] via SUBCUTANEOUS
  Administered 2018-04-30: 3 [IU] via SUBCUTANEOUS
  Administered 2018-04-30: 2 [IU] via SUBCUTANEOUS
  Administered 2018-05-01: 5 [IU] via SUBCUTANEOUS
  Administered 2018-05-02 (×2): 3 [IU] via SUBCUTANEOUS
  Administered 2018-05-03: 11 [IU] via SUBCUTANEOUS
  Administered 2018-05-03: 5 [IU] via SUBCUTANEOUS
  Administered 2018-05-04: 3 [IU] via SUBCUTANEOUS
  Administered 2018-05-04: 2 [IU] via SUBCUTANEOUS
  Administered 2018-05-05: 3 [IU] via SUBCUTANEOUS
  Administered 2018-05-05 – 2018-05-06 (×3): 2 [IU] via SUBCUTANEOUS
  Administered 2018-05-06: 3 [IU] via SUBCUTANEOUS
  Filled 2018-04-25 (×29): qty 1

## 2018-04-25 MED ORDER — INSULIN ASPART 100 UNIT/ML ~~LOC~~ SOLN
0.0000 [IU] | Freq: Every day | SUBCUTANEOUS | Status: DC
  Administered 2018-04-26 – 2018-04-27 (×2): 3 [IU] via SUBCUTANEOUS
  Administered 2018-05-01: 2 [IU] via SUBCUTANEOUS
  Filled 2018-04-25 (×3): qty 1

## 2018-04-25 MED ORDER — SODIUM CHLORIDE 0.9% IV SOLUTION
Freq: Once | INTRAVENOUS | Status: AC
  Administered 2018-04-25: 11:00:00 via INTRAVENOUS

## 2018-04-25 NOTE — [place] Notes (Signed)
PT Cancellation Note  Patient Details Name: Natalie Knight MRN: [id number] DOB: 02-26-1936   Cancelled Treatment:    Reason Eval/Treat Not Completed: Medical issues which prohibited therapy Pt with very low blood levels, H&H 4.9 and 14.7), as well as low BP.  Will hold PT this date and assess when she is more appropriate for activity.  Malachi ProGalen R Rosezella Kronick, DPT 04/25/2018, 11:42 AM

## 2018-04-25 NOTE — [place] Notes (Signed)
Pt continues with very poor PO intake.  Very lethargic and slept most of this shift.  Repeated instructions sometimes result in her opening her mouth, but she then does not understand instructions to close and swallow.  Received two units PRBCs.  Family anticipates Palliative meeting tomorrow at 8911.

## 2018-04-25 NOTE — Consult Note (Addendum)
Consultation Note Date: 04/25/2018   Patient Name: Natalie Knight  DOB: 20-Oct-1935  MRN: [id number]  Age / Sex: 82 y.o., female  PCP: Tracie Harrier, MD Referring Physician: Demetrios Loll, MD  Reason for Consultation: Establishing goals of care  HPI/Patient Profile: 82 y.o. female  with past medical history of diabetes, HTN, anemia, dementia, gout, and DVT on eliquis admitted on 04/18/2018 after being found unresponsive by family. Family performed CPR and pulse was present when EMS arrived. Patient was intubated in ED for airway protection. Extubated 8/10. Concern for bacterial meningitis versus viral encephalitis with acute encephalopathy. Altered mental status remains an issue along with poor PO intake. Patient received 2 PRBCs for hgb of 4.9 on 8/13. PMT consulted for [place].   Clinical Assessment and Goals of Care: I have reviewed medical records including EPIC notes, labs and imaging, received report from Dr. Alva Garnet and Dr. Darvin Neighbours, assessed the patient and then met at the bedside along with patient's son, Thayer Jew, and daughter, Yamilka,  to discuss diagnosis prognosis, GOC, EOL wishes, disposition and options.  Patient unable to participate in conversation d/t AMS.  I introduced Palliative Medicine as specialized medical care for people living with serious illness. It focuses on providing relief from the symptoms and stress of a serious illness. The goal is to improve quality of life for both the patient and the family.  They tell me patient lives alone, but they check on her frequently.   As far as functional and nutritional status, they tell me she ambulates with a walker and is able to complete ADLs independently. They endorse poor appetite - tell me she only ate once a day. They tried multiple ways to get her to eat more but were unsuccessful. They also tell me of her dementia diagnosis and describe her as "forgetful".  We discussed their current  illness and what it means in the larger context of their on-going co-morbidities. We discussed patient's altered mental status and poor PO intake. Today, patient is starting to eat more than she has previously and is more alert than yesterday. They are hopeful she is beginning to improve.   I attempted to elicit values and goals of care important to the patient.  They tell me independence is important to her and being at home.   The difference between aggressive medical intervention and comfort care was considered in light of the patient's goals of care. They would like to continue current medical care, however, would not want aggressive medical interventions.   Advanced directives, concepts specific to code status, artifical feeding and hydration, and rehospitalization were considered and discussed. Confirmed DNR. Family brought up the topic of feeding tube - they ask multiple questions about this. We discussed pros and cons of feeding tube. After thorough discussion, family decides no feeding tube.   Hospice and Palliative Care services outpatient were explained and offered. Unsure if patient is eligible for hospice if mental status and PO intake continue to improve. Her only life limiting illness is dementia, but does not appear to be in late stages. Offered palliative care to follow patient upon discharge and family agrees. Discussed that if patient does not continue to improve - continues to be lethargic with poor po intake - then she may be eligible for hospice and we could discuss.   Discussed possibility that patient will need rehab at discharge - family agrees to this if recommended by medical team.   Questions and concerns were addressed. The family was encouraged to call  with questions or concerns.   Primary Decision Maker NEXT OF KIN - adult children Thayer Jew and Nyeema    SUMMARY OF RECOMMENDATIONS    - DNR  - continue medical management  - hopeful for continued improvement in  mentation and PO intake - no feeding tube if no improvement  - palliative to follow on discharge - family agrees to rehab if recommended by team once stable for discharge, social work consulted  - interested in hospice if declines/ does not improve  - PMT continue to follow  Symptom Management:  Could consider mirtazipine as appetite stimulamnt  Code Status/Advance Care Planning:  DNR  Palliative Prophylaxis:   Aspiration, Bowel Regimen, Delirium Protocol, Frequent Pain Assessment, Oral Care and Turn Reposition  Additional Recommendations (Limitations, Scope, Preferences):  No Artificial Feeding  Psycho-social/Spiritual:   Desire for further Chaplaincy support:no  Additional Recommendations: none  Prognosis:   Unable to determine  Discharge Planning: [place] for rehab with Palliative care service follow-up - pending PT eval     Primary Diagnoses: Present on Admission: . Meningitis   I have reviewed the medical record, interviewed the patient and family, and examined the patient. The following aspects are pertinent.  Past Medical History:  Diagnosis Date  . Diabetes mellitus without complication ([place])   . Hypertension    Social History   Socioeconomic History  . Marital status: Widowed    Spouse name: Not on file  . Number of children: Not on file  . Years of education: Not on file  . Highest education level: Not on file  Occupational History  . Not on file  Social Needs  . Financial resource strain: Not on file  . Food insecurity:    Worry: Not on file    Inability: Not on file  . Transportation needs:    Medical: Not on file    Non-medical: Not on file  Tobacco Use  . Smoking status: Never Smoker  . Smokeless tobacco: Current User    Types: Snuff  Substance and Sexual Activity  . Alcohol use: No  . Drug use: Not on file  . Sexual activity: Not on file  Lifestyle  . Physical activity:    Days per week: Not on file    Minutes  per session: Not on file  . Stress: Not on file  Relationships  . Social connections:    Talks on phone: Not on file    Gets together: Not on file    Attends religious service: Not on file    Active member of club or organization: Not on file    Attends meetings of clubs or organizations: Not on file    Relationship status: Not on file  Other Topics Concern  . Not on file  Social History Narrative  . Not on file   History reviewed. No pertinent family history. Scheduled Meds: . chlorhexidine  15 mL Mouth Rinse BID  . feeding supplement (ENSURE ENLIVE)  237 mL Oral TID BM  . insulin aspart  0-15 Units Subcutaneous TID WC  . insulin aspart  0-5 Units Subcutaneous QHS  . mouth rinse  15 mL Mouth Rinse q12n4p  . multivitamin with minerals  1 tablet Oral Daily  . senna-docusate  1 tablet Per Tube BID   Continuous Infusions: . sodium chloride    . acyclovir Stopped (04/25/18 1030)  . ampicillin (OMNIPEN) IV 2 g (04/25/18 1327)  . cefTRIAXone (ROCEPHIN)  IV Stopped (04/25/18 9381)  . vancomycin Stopped (04/24/18 1914)  PRN Meds:.sodium chloride, acetaminophen, albuterol, hydrALAZINE, ondansetron (ZOFRAN) IV No Known Allergies Review of Systems  Unable to perform ROS: Mental status change    Physical Exam  Constitutional: No distress.  HENT:  Head: Normocephalic and atraumatic.  Cardiovascular: Normal rate and regular rhythm.  Pulmonary/Chest: Effort normal and breath sounds normal. No respiratory distress.  Abdominal: Soft. Bowel sounds are decreased.  Musculoskeletal: She exhibits edema.  Skin: Skin is warm and dry. She is not diaphoretic.  Psychiatric: Cognition and memory are impaired.    Vital Signs: BP (!) 140/59   Pulse 88   Temp 98 F (36.7 C) (Axillary)   Resp 12   Ht '5\' 5"'  (1.651 m)   Wt 87.7 kg   SpO2 100%   BMI 32.17 kg/m  Pain Scale: CPOT       SpO2: SpO2: 100 % O2 Device:SpO2: 100 % O2 Flow Rate: .O2 Flow Rate (L/min): 2 L/min  IO:  Intake/output summary:   Intake/Output Summary (Last 24 hours) at 04/25/2018 1332 Last data filed at 04/25/2018 1200 Gross per 24 hour  Intake 736.73 ml  Output 565 ml  Net 171.73 ml    LBM: Last BM Date: 04/25/18 Baseline Weight: Weight: 69.1 kg Most recent weight: Weight: 87.7 kg     Palliative Assessment/Data: PPS 20%     Time Total: 70 minutes Greater than 50%  of this time was spent counseling and coordinating care related to the above assessment and plan.  Juel Burrow, DNP, AGNP-C Palliative Medicine Team [phone] Pager: [phone]

## 2018-04-25 NOTE — Progress Notes (Signed)
[facility] [place], [place] 04/25/18  Subjective:   Serum creatinine improved to 1.43 today Urine output is fair Getting blood transfusion at this time  Objective:  Vital signs in last 24 hours:  Temp:  [97.8 F (36.6 C)-99.3 F (37.4 C)] 98 F (36.7 C) (08/13 1200) Pulse Rate:  [79-100] 88 (08/13 1245) Resp:  [0-26] 0 (08/13 1249) BP: (88-123)/(44-73) 121/48 (08/13 1200) SpO2:  [96 %-100 %] 100 % (08/13 1245) Weight:  [87.7 kg] 87.7 kg (08/13 0451)  Weight change: 0.5 kg Filed Weights   04/23/18 0500 04/24/18 0400 04/25/18 0451  Weight: 87.7 kg 87.2 kg 87.7 kg    Intake/Output:    Intake/Output Summary (Last 24 hours) at 04/25/2018 1319 Last data filed at 04/25/2018 1200 Gross per 24 hour  Intake 736.73 ml  Output 565 ml  Net 171.73 ml     Physical Exam: General:  Laying in the bed, no acute distress  Neck  no JVD  Pulm/lungs  coarse breath sounds, normal breathing effort, room air  CVS/Heart  irregular,   Abdomen:   Soft, nontender  Extremities:  SCDs in place, nonsense edema bilaterally  Neurologic:  open eyes but did not answer any questions  Skin:  No acute rashes    Foley in place       Basic Metabolic Panel:  Recent Labs  Lab 04/20/18 0642 04/21/18 0411 04/21/18 1820 04/22/18 0459 04/23/18 0608 04/24/18 0217 04/25/18 0454  NA 137 140 140 140 143 143 142  K 3.7 4.3 4.7 4.8 4.5 4.3 3.9  CL 103 109 108 108 110 112* 113*  CO2 20* 23 23 21* 20* 19* 24  GLUCOSE 327* 183* 150* 153* 146* 172* 233*  BUN 36* 37* 47* 51* 56* 47* 42*  CREATININE 1.73* 1.49* 1.98* 2.34* 2.02* 1.62* 1.43*  CALCIUM 8.3* 8.2* 7.9* 7.7* 8.2* 8.5* 8.8*  MG 2.3  --   --  2.2 2.1 2.0 1.9  PHOS 1.6* 3.1  --  4.1 3.6 3.9 4.2     CBC: Recent Labs  Lab 04/21/18 0411 04/22/18 0459 04/23/18 0608 04/24/18 0217 04/25/18 0454 04/25/18 0727  WBC 22.1* 13.2* 12.2* 12.6* 8.9  --   NEUTROABS  --   --  10.1* 10.5*  --   --   HGB 11.2* 10.6* 10.9* 8.8*  5.1* 4.9*  HCT 34.3* 32.6* 33.4* 26.6* 15.3* 14.7*  MCV 77.4* 78.1* 76.8* 76.7* 77.2*  --   PLT 168 138* 154 157 139*  --      No results found for: HEPBSAG, HEPBSAB, HEPBIGM    Microbiology:  Recent Results (from the past 240 hour(s))  Urine culture     Status: None   Collection Time: 04/18/18 12:08 PM  Result Value Ref Range Status   Specimen Description   Final    URINE, RANDOM Performed at [facility], [address] [place], [place] [address]    Special Requests   Final    NONE Performed at [facility], [address] [place], [place] [address]    Culture   Final    NO GROWTH Performed at [facility], [address] [place], [place] [address]    Report Status 04/19/2018 FINAL  Final  Blood Culture (routine x 2)     Status: None   Collection Time: 04/18/18  1:12 PM  Result Value Ref Range Status   Specimen Description BLOOD RIGHT HAND  Final   Special Requests   Final  BOTTLES DRAWN AEROBIC AND ANAEROBIC Blood Culture results may not be optimal due to an inadequate volume of blood received in culture bottles   Culture   Final    NO GROWTH 5 DAYS Performed at [facility], [address] [place], [place] [address]    Report Status 04/23/2018 FINAL  Final  Blood Culture (routine x 2)     Status: None   Collection Time: 04/18/18  1:20 PM  Result Value Ref Range Status   Specimen Description [facility]LOOD RIGHT [facility]  Final   Special Requests   Final    BOTTLES DRAWN AEROBIC AND ANAEROBIC Blood Culture adequate volume   Culture   Final    NO GROWTH 5 DAYS Performed at [facility], [address] [place], [place] [address]    Report Status 04/23/2018 FINAL  Final  MRSA PCR Screening     Status: None   Collection Time: 04/18/18  5:19 PM  Result Value Ref Range Status   MRSA by PCR NEGATIVE NEGATIVE Final    Comment:        The GeneXpert MRSA Assay (FDA approved for NASAL specimens only), is one component of  a comprehensive MRSA colonization surveillance program. It is not intended to diagnose MRSA infection nor to guide or monitor treatment for MRSA infections. Performed at [facility], [address] [place], [place] [address]     Coagulation Studies: No results for input(s): LABPROT, INR in the last 72 hours.  Urinalysis: No results for input(s): COLORURINE, LABSPEC, PHURINE, GLUCOSEU, HGBUR, BILIRUBINUR, KETONESUR, PROTEINUR, UROBILINOGEN, NITRITE, LEUKOCYTESUR in the last 72 hours.  Invalid input(s): APPERANCEUR    Imaging: No results found.   Medications:   . sodium chloride    . acyclovir Stopped (04/25/18 1030)  . ampicillin (OMNIPEN) IV Stopped (04/25/18 0030)  . cefTRIAXone (ROCEPHIN)  IV Stopped (04/25/18 56210613)  . vancomycin Stopped (04/24/18 1914)   . chlorhexidine  15 mL Mouth Rinse BID  . feeding supplement (ENSURE ENLIVE)  237 mL Oral TID BM  . insulin aspart  0-15 Units Subcutaneous TID WC  . insulin aspart  0-5 Units Subcutaneous QHS  . mouth rinse  15 mL Mouth Rinse q12n4p  . multivitamin with minerals  1 tablet Oral Daily  . senna-docusate  1 tablet Per Tube BID   sodium chloride, acetaminophen, albuterol, hydrALAZINE, ondansetron (ZOFRAN) IV  Assessment/ Plan:  82 y.o. female with diabetes mellitus type II, dementia, anemia, hypertension, gout, DVT on eliquis, restless leg syndrome, who was [facility]18[facility]2018 for Acute respiratory failure (HCC) [J96.00] Status epilepticus (HCC) [G40.901] Meningitis [G03.9] Sepsis, due to unspecified organism (HCC) [A41.9]  1. Acute renal failure: baseline creatinine of 0.9, GFR > 60 on 02/10/18. (Care everywhere) Acute renal failure secondary to IV contrast nephropathy, hypotension, vancomycin and sepsis UOP and S Creatinine are improving continue supportive care  2.  Sepsis and altered mental status: improving. Suspected acute bacterial meningitis Treated with acyclovir, ampicillin and  ceftriaxone.  Followed by neurologist Dose adjust medications based on renal function in collaboration with pharmacy   LOS: 7 [facility] 8/13/20191:19 PM  [facility] [place], [place] [phone]  Note: This note was prepared with Dragon dictation. Any transcription errors are unintentional

## 2018-04-25 NOTE — Progress Notes (Signed)
[employer] [facility] [place] [facility]   PATIENT NAME: Natalie Knight    MR#:  [id number]  DATE OF BIRTH:  [birth year]  SUBJECTIVE:  CHIEF COMPLAINT:   Chief Complaint  Patient presents with  . Altered Mental Status   Patient extubated but is still drowsy.  She opened eyes on stimuli.  She has no active bleeding but hemoglobin dropped to 4.9. REVIEW OF SYSTEMS:  Review of Systems  Unable to perform ROS: Intubated    DRUG ALLERGIES:  No Known Allergies VITALS:  Blood pressure 137/66, pulse 78, temperature 97.9 F (36.6 C), temperature source Axillary, resp. rate (!) 7, height 5\' 5"  (1.651 m), weight 87.7 kg, SpO2 100 %. PHYSICAL EXAMINATION:  Physical Exam  HENT:  Head: Normocephalic.  Eyes: No scleral icterus.  Bilateral pupils are pinpoint, not reactive to light.  Neck: Neck supple. No JVD present. No tracheal deviation present.  Cardiovascular: Normal rate, regular rhythm and normal heart sounds. Exam reveals no gallop.  No murmur heard. Pulmonary/Chest: Breath sounds normal. No respiratory distress. She has no wheezes. She has no rales.  Abdominal: Soft. Bowel sounds are normal. She exhibits no distension. There is no tenderness. There is no rebound.  Musculoskeletal: She exhibits no edema or tenderness.  Neurological:  Drowsy, not follow command, unable to exam.  Skin: No rash noted. No erythema.   LABORATORY PANEL:  Female CBC Recent Labs  Lab 04/25/18 0454 04/25/18 0727  WBC 8.9  --   HGB 5.1* 4.9*  HCT 15.3* 14.7*  PLT 139*  --    ------------------------------------------------------------------------------------------------------------------ Chemistries  Recent Labs  Lab 04/24/18 0217 04/25/18 0454  NA 143 142  K 4.3 3.9  CL 112* 113*  CO2 19* 24  GLUCOSE 172* 233*  BUN 47* 42*  CREATININE 1.62* 1.43*  CALCIUM 8.5* 8.8*  MG 2.0 1.9  AST 106*  --   ALT 94*  --   ALKPHOS 40  --   BILITOT 0.9  --    RADIOLOGY:  No results  found. ASSESSMENT AND PLAN:   Acute respiratory failure with hypoxia. Patient was extubated.  In room air.  *Acute sepsis Suspected due to acute bacterial meningitis Continue ampicillin, Rocephin and acyclovir, dexamethasone IV,  MRI of the brain- no acute findings.  No indication for LP per Dr. Thad Rangereynolds.  Leukocytosis is improving.  Blood culture is negative so far.  Vancomycin was discontinued. Would continue antibiotic coverage for full 14 day course per Dr. Thad Rangereynolds.  *Acute comatose state/acute metabolic encephalopathy. Most likely secondary to above, MRI and EEG done. Avoid psychotropic meds, neurochecks per routine, aspiration/fall precautions,  negative RPR.  * Ac renal fialure, ATN   Likely due to Hypoperfusion secondary to hypotension, improving with IV fluids, Follow-up BMP.  * Hypotension.  Off Levophed drip.    Improved.  * Elevated troponin, possible due to demanding ischemia secondary to above.  * Lactic acidosis.  Due to above.  Improved.  *Chronic diabetes mellitus type 2 On sliding scale.  *Chronic benign essential hypertension Hold hypertension medication due to hypotension.  *Acute hypokalemia Repleted with IV potassium, improved.  *History of DVT Hold Eliquis for now, SCDs,  heparine drip is discontinued.   Anemia of chronic disease and acute blood loss, unclear etiology. PRBC transfusion.  Follow-up hemoglobin.  Discontinued heparin drip.  Very poor prognosis.  Palliative care consult.  All the records are reviewed and case discussed with Care Management/Social Worker. Management plans discussed with the patient's daughter, and they are  in agreement.  CODE STATUS: DNR  TOTAL TIME TAKING CARE OF THIS PATIENT: 35 minutes.    POSSIBLE D/C IN ? DAYS, DEPENDING ON CLINICAL CONDITION.   Shaune PollackQing Latise Dilley M.D on 04/25/2018 at 5:10 PM  Between 7am to 6pm - Pager - [phone]  After 6pm go to [email or web address] - Hotel managerpassword [email or web address]  [employer]  [place] [facility]

## 2018-04-25 NOTE — Progress Notes (Signed)
OT Cancellation Note  Patient Details Name: Natalie Knight MRN: [id number] DOB: 04/24/1936   Cancelled Treatment:    Reason Eval/Treat Not Completed: Medical issues which prohibited therapy. Pt noted with significantly low hemoglobin and hematocrit this am (Hgb 4.9, HCT 14.7). Contraindicated for therapy at this time. Will hold and re-attempt OT evaluation at later date/time as pt is medically appropriate.   Richrd PrimeJamie Stiller, MPH, MS, OTR/L ascom [phone] 04/25/18, 8:13 AM

## 2018-04-25 NOTE — Progress Notes (Signed)
Pharmacy Electrolyte Monitoring Consult:  Pharmacy consulted to assist in monitoring and replacing electrolytes in this 82 y.o. female admitted on 04/18/2018 with Altered Mental Status  Labs:  Sodium (mmol/L)  Date Value  04/25/2018 142  10/03/2014 142   Potassium (mmol/L)  Date Value  04/25/2018 3.9  10/03/2014 3.6   Magnesium (mg/dL)  Date Value  16/10/960408/13/2019 1.9   Phosphorus (mg/dL)  Date Value  54/09/811908/13/2019 4.2   Calcium (mg/dL)  Date Value  14/78/295608/13/2019 8.8 (L)   Calcium, Total (mg/dL)  Date Value  21/30/865701/21/2016 9.3   Albumin (g/dL)  Date Value  84/69/629508/08/2018 2.7 (L)  10/01/2014 2.6 (L)    Assessment/Plan: No additional electrolyte replacement needed at this time.   Will continue to follow daily labs due to possible refeeding risk.   Valentina Guhristy, Angila Wombles D, PharmD, BCPS Clinical Pharmacist 04/25/2018 4:04 PM

## 2018-04-25 NOTE — Progress Notes (Signed)
Keep foley and central line in place for now, for patient's comfort, per Dr Sung AmabileSimonds

## 2018-04-25 NOTE — Progress Notes (Addendum)
No distress Seems more alert but not following commands Hemoglobin low this a.m. without overt signs of bleeding Presently, receiving second unit PRBCs  Vitals:   04/25/18 1430 04/25/18 1435 04/25/18 1445 04/25/18 1450  BP:    124/66  Pulse: 82 79 79 78  Resp: 12 18 17  (!) 9  Temp:    98 F (36.7 C)  TempSrc:    Axillary  SpO2: 100% 100% 100% 99%  Weight:      Height:      RA  Gen: NAD HEENT: NCAT, sclerae white, oropharynx normal Neck: No LAN, no JVD noted Lungs: full BS, no adventitious sounds Cardiovascular: Regular, no M noted Abdomen: Soft, NT, +BS Ext: no C/C/E Neuro: PERRL, EOMI, moves all extremities Skin: No lesions noted  BMP Latest Ref Rng & Units 04/25/2018 04/24/2018 04/23/2018  Glucose 70 - 99 mg/dL 161(W233(H) 960(A172(H) 540(J146(H)  BUN 8 - 23 mg/dL 81(X42(H) 91(Y47(H) 78(G56(H)  Creatinine 0.44 - 1.00 mg/dL 9.56(O1.43(H) 1.30(Q1.62(H) 6.57(Q2.02(H)  Sodium 135 - 145 mmol/L 142 143 143  Potassium 3.5 - 5.1 mmol/L 3.9 4.3 4.5  Chloride 98 - 111 mmol/L 113(H) 112(H) 110  CO2 22 - 32 mmol/L 24 19(L) 20(L)  Calcium 8.9 - 10.3 mg/dL 4.6(N8.8(L) 6.2(X8.5(L) 5.2(W8.2(L)    CBC Latest Ref Rng & Units 04/25/2018 04/25/2018 04/24/2018  WBC 3.6 - 11.0 K/uL - 8.9 12.6(H)  Hemoglobin 12.0 - 16.0 g/dL 4.9(LL) 5.1(L) 8.8(L)  Hematocrit 35.0 - 47.0 % 14.7(LL) 15.3(L) 26.6(L)  Platelets 150 - 440 K/uL - 139(L) 157    CXR: NNF  IMPRESSION:  Acute respiratory failure, resolved Acute kidney injury, resolving Acute encephalopathy, suspected meningitis versus encephalitis Type 2 diabetes Anemia, presumed acute blood loss  No site of bleeding identified  No hemodynamic compromise History of DVT (diagnosed 01/2018, probably chronic per report) DNR  PLAN/REC: Transfer to MedSurg floor ordered Monitor BMET intermittently Monitor I/Os Correct electrolytes as indicated Transfusion of 2 units PRBCs ordered by me Eliquis discontinued Continue SSI Complete 10-14 days antibiotics for meningitis, herpes encephalitis Palliative care  involved Goals of care meeting planned for 8/14 Depending on goals of care, recommend no further anticoagulation therapy  After transfer, PCCM will sign off. Please call if we can be of further assistance   Billy Fischeravid Simonds, MD PCCM service Mobile [phone] Pager [phone] 04/25/2018 4:49 PM

## 2018-04-25 NOTE — Progress Notes (Signed)
Speech Language Pathology Dysphagia Treatment Patient Details Name: Natalie Knight MRN: [id number] DOB: November 18, 1935 Today's Date: 04/25/2018 Time: 1350-1420 SLP Time Calculation (min) (ACUTE ONLY): 30 min  Assessment / Plan / Recommendation Clinical Impression   pt presents with a moderate to severe oral pharyngeal dysphagia as characterized by poor mastication, increased a-p transit time. Pt required significant cues and assistance to intake solids and liquids. Pt was able to intake x 2 small bites of puree with no overt ssx aspiration. Pt intake x 2 sips of thin liquid via spoon with noted decrease in O2 stats. Pt was unable to drink liquids via straw. Pt was noted to have delayed cough and throat clear post intake of thin liquids. Pt was able to take x 1 spoon nectar thick liquids and refused further trials. Pt does become too lethargic to safely consume po trials. slp was able to educate nsg in room to only attempt po trials upon pt alert status. SLP recommends Dys 1 with nectar thick liquids at this time pending further palliative care consult     Diet Recommendation    Dysphagia 1 with Nectar thick liquids   SLP Plan Continue with current plan of care   Pertinent Vitals/Pain None reported   Swallowing Goals     General Behavior/Cognition: Confused;Lethargic/Drowsy Patient Positioning: Upright in bed Oral care provided: N/A HPI: 82 year old female with PMHx of DM, HTN who is admitted after being found unresponsive by friends, was intubated on 8/6 for airway protection, with suspected bacterial meningitis vs CVA.   Oral Cavity - Oral Hygiene     Dysphagia Treatment Treatment Methods: Skilled observation;Upgraded PO texture trial;Patient/caregiver education Patient observed directly with PO's: Yes Type of PO's observed: Dysphagia 1 (puree);Nectar-thick liquids;Thin liquids Feeding: Total assist Liquids provided via: Teaspoon;Cup;Straw Oral Phase Signs & Symptoms: Anterior  loss/spillage;Prolonged mastication;Prolonged bolus formation Pharyngeal Phase Signs & Symptoms: Suspected delayed swallow initiation;Immediate cough;Delayed throat clear;Changes in respirations Type of cueing: Verbal;Tactile;Visual Amount of cueing: Maximal   GO     Meredith PelStacie Harris Sauber 04/25/2018, 2:31 PM

## 2018-04-25 NOTE — Progress Notes (Signed)
No charge note.  Palliative care consult received and chart reviewed. Spoke with patient's daughter, Natalie Knight [phone]). She would like to involve other family members and requested GOC meeting tomorrow (8/14) at 11 am.  Thank you for this consult.  Natalie RenShae Lee Anayia Eugene, DNP, AGNP-C Palliative Medicine Team Team Phone # [phone]  Pager # [phone]

## 2018-04-26 ENCOUNTER — Inpatient Hospital Stay: Payer: Medicare HMO

## 2018-04-26 DIAGNOSIS — A419 Sepsis, unspecified organism: Secondary | ICD-10-CM

## 2018-04-26 DIAGNOSIS — I10 Essential (primary) hypertension: Secondary | ICD-10-CM

## 2018-04-26 DIAGNOSIS — F17220 Nicotine dependence, chewing tobacco, uncomplicated: Secondary | ICD-10-CM

## 2018-04-26 DIAGNOSIS — R509 Fever, unspecified: Secondary | ICD-10-CM

## 2018-04-26 DIAGNOSIS — D62 Acute posthemorrhagic anemia: Secondary | ICD-10-CM

## 2018-04-26 DIAGNOSIS — N179 Acute kidney failure, unspecified: Secondary | ICD-10-CM

## 2018-04-26 DIAGNOSIS — E119 Type 2 diabetes mellitus without complications: Secondary | ICD-10-CM

## 2018-04-26 DIAGNOSIS — Z8673 Personal history of transient ischemic attack (TIA), and cerebral infarction without residual deficits: Secondary | ICD-10-CM

## 2018-04-26 DIAGNOSIS — M79605 Pain in left leg: Secondary | ICD-10-CM

## 2018-04-26 DIAGNOSIS — Z794 Long term (current) use of insulin: Secondary | ICD-10-CM

## 2018-04-26 DIAGNOSIS — Z7901 Long term (current) use of anticoagulants: Secondary | ICD-10-CM

## 2018-04-26 DIAGNOSIS — Z960 Presence of urogenital implants: Secondary | ICD-10-CM

## 2018-04-26 DIAGNOSIS — Z86718 Personal history of other venous thrombosis and embolism: Secondary | ICD-10-CM

## 2018-04-26 DIAGNOSIS — Z7189 Other specified counseling: Secondary | ICD-10-CM

## 2018-04-26 DIAGNOSIS — Z7982 Long term (current) use of aspirin: Secondary | ICD-10-CM

## 2018-04-26 DIAGNOSIS — R6510 Systemic inflammatory response syndrome (SIRS) of non-infectious origin without acute organ dysfunction: Secondary | ICD-10-CM

## 2018-04-26 DIAGNOSIS — F0150 Vascular dementia without behavioral disturbance: Secondary | ICD-10-CM

## 2018-04-26 DIAGNOSIS — R630 Anorexia: Secondary | ICD-10-CM

## 2018-04-26 DIAGNOSIS — Z515 Encounter for palliative care: Secondary | ICD-10-CM

## 2018-04-26 DIAGNOSIS — M79604 Pain in right leg: Secondary | ICD-10-CM

## 2018-04-26 DIAGNOSIS — Z9889 Other specified postprocedural states: Secondary | ICD-10-CM

## 2018-04-26 DIAGNOSIS — Z79899 Other long term (current) drug therapy: Secondary | ICD-10-CM

## 2018-04-26 LAB — VANCOMYCIN, TROUGH: VANCOMYCIN TR: 17 ug/mL (ref 15–20)

## 2018-04-26 LAB — CBC
HCT: 25.5 % — ABNORMAL LOW (ref 35.0–47.0)
Hemoglobin: 8.8 g/dL — ABNORMAL LOW (ref 12.0–16.0)
MCH: 27.9 pg (ref 26.0–34.0)
MCHC: 34.5 g/dL (ref 32.0–36.0)
MCV: 80.7 fL (ref 80.0–100.0)
PLATELETS: 143 10*3/uL — AB (ref 150–440)
RBC: 3.16 MIL/uL — ABNORMAL LOW (ref 3.80–5.20)
RDW: 15.4 % — AB (ref 11.5–14.5)
WBC: 13.2 10*3/uL — AB (ref 3.6–11.0)

## 2018-04-26 LAB — BPAM RBC
Blood Product Expiration Date: 201908142359
Blood Product Expiration Date: 201908162359
ISSUE DATE / TIME: 201908131124
ISSUE DATE / TIME: 201908131424
UNIT TYPE AND RH: 9500
Unit Type and Rh: 9500

## 2018-04-26 LAB — TYPE AND SCREEN
ABO/RH(D): O POS
Antibody Screen: NEGATIVE
UNIT DIVISION: 0
Unit division: 0

## 2018-04-26 LAB — GLUCOSE, CAPILLARY
GLUCOSE-CAPILLARY: 249 mg/dL — AB (ref 70–99)
Glucose-Capillary: 180 mg/dL — ABNORMAL HIGH (ref 70–99)
Glucose-Capillary: 270 mg/dL — ABNORMAL HIGH (ref 70–99)
Glucose-Capillary: 265 mg/dL — ABNORMAL HIGH (ref 70–99)

## 2018-04-26 LAB — BASIC METABOLIC PANEL
Anion gap: 8 (ref 5–15)
BUN: 32 mg/dL — AB (ref 8–23)
CALCIUM: 9 mg/dL (ref 8.9–10.3)
CHLORIDE: 114 mmol/L — AB (ref 98–111)
CO2: 23 mmol/L (ref 22–32)
CREATININE: 1.04 mg/dL — AB (ref 0.44–1.00)
GFR calc Af Amer: 58 mL/min — ABNORMAL LOW (ref >60)
GFR, EST NON AFRICAN AMERICAN: 50 mL/min — AB (ref >60)
Glucose, Bld: 212 mg/dL — ABNORMAL HIGH (ref 70–99)
Potassium: 3.7 mmol/L (ref 3.5–5.1)
SODIUM: 145 mmol/L (ref 135–145)

## 2018-04-26 LAB — MAGNESIUM: Magnesium: 1.7 mg/dL (ref 1.7–2.4)

## 2018-04-26 LAB — PHOSPHORUS: PHOSPHORUS: 3.3 mg/dL (ref 2.5–4.6)

## 2018-04-26 MED ORDER — ACETAMINOPHEN 325 MG PO TABS
650.0000 mg | ORAL_TABLET | Freq: Four times a day (QID) | ORAL | Status: DC | PRN
  Administered 2018-04-26 – 2018-05-05 (×7): 650 mg via ORAL
  Filled 2018-04-26 (×7): qty 2

## 2018-04-26 MED ORDER — POTASSIUM CHLORIDE CRYS ER 20 MEQ PO TBCR
20.0000 meq | EXTENDED_RELEASE_TABLET | Freq: Once | ORAL | Status: AC
  Administered 2018-04-26: 20 meq via ORAL
  Filled 2018-04-26: qty 1

## 2018-04-26 MED ORDER — MAGNESIUM SULFATE 2 GM/50ML IV SOLN
2.0000 g | Freq: Once | INTRAVENOUS | Status: AC
  Administered 2018-04-26: 2 g via INTRAVENOUS
  Filled 2018-04-26: qty 50

## 2018-04-26 MED ORDER — VANCOMYCIN HCL IN DEXTROSE 750-5 MG/150ML-% IV SOLN
750.0000 mg | INTRAVENOUS | Status: DC
  Administered 2018-04-26 – 2018-04-27 (×2): 750 mg via INTRAVENOUS
  Filled 2018-04-26 (×3): qty 150

## 2018-04-26 MED ORDER — LOSARTAN POTASSIUM 50 MG PO TABS
50.0000 mg | ORAL_TABLET | Freq: Every day | ORAL | Status: DC
  Administered 2018-04-26 – 2018-05-06 (×10): 50 mg via ORAL
  Filled 2018-04-26 (×10): qty 1

## 2018-04-26 MED ORDER — ACETAMINOPHEN 325 MG PO TABS: 650.0000 mg | ORAL_TABLET | Freq: Four times a day (QID) | ORAL | Status: DC | PRN

## 2018-04-26 MED ORDER — DEXTROSE 5 % IV SOLN
900.0000 mg | Freq: Two times a day (BID) | INTRAVENOUS | Status: DC
  Administered 2018-04-26 – 2018-04-27 (×2): 900 mg via INTRAVENOUS
  Filled 2018-04-26 (×3): qty 18

## 2018-04-26 NOTE — Clinical Social Work Placement (Signed)
   CLINICAL SOCIAL WORK PLACEMENT  NOTE  Date:  04/26/2018  Patient Details  Name: Natalie Knight MRN: [id number] Date of Birth: [birth year]  Clinical Social Work is seeking post-discharge placement for this patient at the Skilled  Nursing Facility level of care (*CSW will initial, date and re-position this form in  chart as items are completed):  Yes   Patient/family provided with [facility] Clinical Social Work Department's list of facilities offering this level of care within the geographic area requested by the patient (or if unable, by the patient's family).  Yes   Patient/family informed of their freedom to choose among providers that offer the needed level of care, that participate in Medicare, Medicaid or managed care program needed by the patient, have an available bed and are willing to accept the patient.  Yes   Patient/family informed of [facility] ownership interest in [facility] and [facility], as well as of the fact that they are under no obligation to receive care at these facilities.  PASRR submitted to EDS on       PASRR number received on       Existing PASRR number confirmed on 04/26/18     FL2 transmitted to all facilities in geographic area requested by pt/family on 04/26/18     FL2 transmitted to all facilities within larger geographic area on       Patient informed that his/her managed care company has contracts with or will negotiate with certain facilities, including the following:        Yes   Patient/family informed of bed offers received.  Patient chooses bed at ([facility] )     Physician recommends and patient chooses bed at      Patient to be transferred to   on  .  Patient to be transferred to facility by       Patient family notified on   of transfer.  Name of family member notified:        PHYSICIAN       Additional Comment:    _______________________________________________ Jathniel Smeltzer, Darleen CrockerBailey M, LCSW 04/26/2018, 3:45 PM

## 2018-04-26 NOTE — Progress Notes (Signed)
[facility] [place], [place] 04/26/18  Subjective:   Serum creatinine improved to 1.04 today Urine output is fair Daughter is in the room. States patient talkled with her earlier today For me, just opened eyes  Objective:  Vital signs in last 24 hours:  Temp:  [97.8 F (36.6 C)-99 F (37.2 C)] 97.8 F (36.6 C) (08/14 0753) Pulse Rate:  [70-88] 70 (08/14 0753) Resp:  [0-20] 20 (08/14 0753) BP: (124-168)/(59-102) 160/81 (08/14 0753) SpO2:  [98 %-100 %] 100 % (08/14 0753) Weight:  [90.2 kg-93.1 kg] 93.1 kg (08/14 0900)  Weight change: 2.5 kg Filed Weights   04/25/18 0451 04/26/18 0500 04/26/18 0900  Weight: 87.7 kg 90.2 kg 93.1 kg    Intake/Output:    Intake/Output Summary (Last 24 hours) at 04/26/2018 1242 Last data filed at 04/26/2018 1200 Gross per 24 hour  Intake 1895.33 ml  Output 1150 ml  Net 745.33 ml     Physical Exam: General:  Laying in the bed, no acute distress  Neck  no JVD  Pulm/lungs  coarse breath sounds, normal breathing effort, room air  CVS/Heart  irregular,   Abdomen:   Soft, nontender  Extremities:  SCDs in place, trace edema bilaterally  Neurologic:  open eyes. Answers few questions  Skin:  No acute rashes    Foley in place       Basic Metabolic Panel:  Recent Labs  Lab 04/22/18 0459 04/23/18 0608 04/24/18 0217 04/25/18 0454 04/26/18 0509  NA 140 143 143 142 145  K 4.8 4.5 4.3 3.9 3.7  CL 108 110 112* 113* 114*  CO2 21* 20* 19* 24 23  GLUCOSE 153* 146* 172* 233* 212*  BUN 51* 56* 47* 42* 32*  CREATININE 2.34* 2.02* 1.62* 1.43* 1.04*  CALCIUM 7.7* 8.2* 8.5* 8.8* 9.0  MG 2.2 2.1 2.0 1.9 1.7  PHOS 4.1 3.6 3.9 4.2 3.3     CBC: Recent Labs  Lab 04/22/18 0459 04/23/18 0608 04/24/18 0217 04/25/18 0454 04/25/18 0727 04/26/18 0509  WBC 13.2* 12.2* 12.6* 8.9  --  13.2*  NEUTROABS  --  10.1* 10.5*  --   --   --   HGB 10.6* 10.9* 8.8* 5.1* 4.9* 8.8*  HCT 32.6* 33.4* 26.6* 15.3* 14.7* 25.5*  MCV 78.1*  76.8* 76.7* 77.2*  --  80.7  PLT 138* 154 157 139*  --  143*     No results found for: HEPBSAG, HEPBSAB, HEPBIGM    Microbiology:  Recent Results (from the past 240 hour(s))  Urine culture     Status: None   Collection Time: 04/18/18 12:08 PM  Result Value Ref Range Status   Specimen Description   Final    URINE, RANDOM Performed at [facility], [address] [place], [place] [address]    Special Requests   Final    NONE Performed at [facility], [address] [place], [place] [address]    Culture   Final    NO GROWTH Performed at [facility], [address] [place], [place] [address]    Report Status 04/19/2018 FINAL  Final  Blood Culture (routine x 2)     Status: None   Collection Time: 04/18/18  1:12 PM  Result Value Ref Range Status   Specimen Description BLOOD RIGHT HAND  Final   Special Requests   Final    BOTTLES DRAWN AEROBIC AND ANAEROBIC Blood Culture results may not be optimal due to an inadequate volume of blood received  in culture bottles   Culture   Final    NO GROWTH 5 DAYS Performed at [facility], [address] [place], [place] [address]    Report Status 04/23/2018 FINAL  Final  Blood Culture (routine x 2)     Status: None   Collection Time: 04/18/18  1:20 PM  Result Value Ref Range Status   Specimen Description BLOOD RIGHT [facility]  Final   Special Requests   Final    BOTTLES DRAWN AEROBIC AND ANAEROBIC Blood Culture adequate volume   Culture   Final    NO GROWTH 5 DAYS Performed at [facility], [address] [place], [place] [address]    Report Status 04/23/2018 FINAL  Final  MRSA PCR Screening     Status: None   Collection Time: 04/18/18  5:19 PM  Result Value Ref Range Status   MRSA by PCR NEGATIVE NEGATIVE Final    Comment:        The GeneXpert MRSA Assay (FDA approved for NASAL specimens only), is one component of a comprehensive MRSA colonization surveillance program. It is  not intended to diagnose MRSA infection nor to guide or monitor treatment for MRSA infections. Performed at [facility], [address] [place], [place] [address]     Coagulation Studies: No results for input(s): LABPROT, INR in the last 72 hours.  Urinalysis: No results for input(s): COLORURINE, LABSPEC, PHURINE, GLUCOSEU, HGBUR, BILIRUBINUR, KETONESUR, PROTEINUR, UROBILINOGEN, NITRITE, LEUKOCYTESUR in the last 72 hours.  Invalid input(s): APPERANCEUR    Imaging: Dg Chest Port 1 View  Result Date: 04/26/2018 CLINICAL DATA:  Respiratory failure. EXAM: PORTABLE CHEST 1 VIEW COMPARISON:  Radiograph of April 23, 2018. FINDINGS: The heart size and mediastinal contours are within normal limits. No pneumothorax or significant pleural effusion is noted. Right internal jugular catheter is unchanged in position. No acute pulmonary disease is noted. The visualized skeletal structures are unremarkable. IMPRESSION: No acute cardiopulmonary abnormality seen. Electronically Signed   By: Lupita RaiderJames  Green Jr, M.D.   On: 04/26/2018 07:00     Medications:   . sodium chloride    . acyclovir    . ampicillin (OMNIPEN) IV Stopped (04/26/18 1131)  . cefTRIAXone (ROCEPHIN)  IV 2 g (04/26/18 0538)  . vancomycin Stopped (04/25/18 1745)   . chlorhexidine  15 mL Mouth Rinse BID  . feeding supplement (ENSURE ENLIVE)  237 mL Oral TID BM  . insulin aspart  0-15 Units Subcutaneous TID WC  . insulin aspart  0-5 Units Subcutaneous QHS  . mouth rinse  15 mL Mouth Rinse q12n4p  . multivitamin with minerals  1 tablet Oral Daily  . senna-docusate  1 tablet Per Tube BID   sodium chloride, acetaminophen, albuterol, hydrALAZINE, ondansetron (ZOFRAN) IV  Assessment/ Plan:  82 y.o. female with diabetes mellitus type II, dementia, anemia, hypertension, gout, DVT on eliquis, restless leg syndrome, who was [facility]2018 for Acute respiratory failure (HCC) [J96.00] Status epilepticus (HCC)  [G40.901] Meningitis [G03.9] Sepsis, due to unspecified organism (HCC) [A41.9]  1. Acute renal failure: baseline creatinine of 0.9, GFR > 60 on 02/10/18. (Care everywhere) Acute renal failure secondary to IV contrast nephropathy, hypotension, vancomycin and sepsis UOP and S Creatinine have improved close to normal continue supportive care  2.  Sepsis and altered mental status: improving. Suspected acute bacterial meningitis Treated with acyclovir, ampicillin and ceftriaxone.  Followed by neurologist Dose adjust medications based on renal function in collaboration with pharmacy  Will sign off Please  reconsult as necessary   LOS: 8 Quran Vasco Thedore Mins 8/14/201912:42 [facility] [place], [place] [phone]  Note: This note was prepared with Dragon dictation. Any transcription errors are unintentional

## 2018-04-26 NOTE — [place] FL2 (Signed)
[place] MEDICAID FL2 LEVEL OF CARE SCREENING TOOL     IDENTIFICATION  Patient Name: Natalie Knight Birthdate: [birth year] Sex: female Admission Date (Current Location): 04/18/2018  [place]   Facility and Address:  [facility], [address], [place], [place] [address]      Provider Number: [id number]  Attending Physician Name and Address:  Milagros LollSudini, Srikar, MD  Relative Name and Phone Number:       Current Level of Care: Hospital Recommended Level of Care: Skilled Nursing Facility Prior Approval Number:    Date Approved/Denied:   PASRR Number: ([id number])  Discharge Plan: SNF    Current Diagnoses: Patient Active Problem List   Diagnosis Date Noted  . Sepsis (HCC)   . Anorexia   . Goals of care, counseling/discussion   . Palliative care by specialist   . Meningitis 04/18/2018    Orientation RESPIRATION BLADDER Height & Weight     Self  Normal Continent Weight: 93.1 kg Height:  5\' 5"  (165.1 cm)  BEHAVIORAL SYMPTOMS/MOOD NEUROLOGICAL BOWEL NUTRITION STATUS      Incontinent Diet(Diet: DYS 1. )  AMBULATORY STATUS COMMUNICATION OF NEEDS Skin   Extensive Assist Verbally Normal                       Personal Care Assistance Level of Assistance  Bathing, Feeding, Dressing Bathing Assistance: Limited assistance Feeding assistance: Independent Dressing Assistance: Limited assistance     Functional Limitations Info  Sight, Hearing, Speech Sight Info: Adequate Hearing Info: Adequate Speech Info: Adequate    SPECIAL CARE FACTORS FREQUENCY  PT (By licensed PT), OT (By licensed OT)     PT Frequency: (5) OT Frequency: (5)            Contractures      Additional Factors Info  Code Status, Allergies Code Status Info: (DNR ) Allergies Info: (No Known Allergies. )           Current Medications (04/26/2018):  This is the current hospital active medication list Current Facility-Administered  Medications  Medication Dose Route Frequency Provider Last Rate Last Dose  . 0.9 %  sodium chloride infusion  250 mL Intravenous PRN Salary, Montell D, MD      . acetaminophen (TYLENOL) tablet 650 mg  650 mg Oral Q6H PRN Milagros LollSudini, Srikar, MD   650 mg at 04/26/18 1022  . acyclovir (ZOVIRAX) 900 mg in dextrose 5 % 150 mL IVPB  900 mg Intravenous Q12H Maxcine HamMartin, Savanna M, RPH      . albuterol (PROVENTIL) (2.5 MG/3ML) 0.083% nebulizer solution 2.5 mg  2.5 mg Nebulization Q6H PRN Merwyn KatosSimonds, David B, MD      . ampicillin (OMNIPEN) 2 g in sodium chloride 0.9 % 100 mL IVPB  2 g Intravenous Q6H Salary, Evelena AsaMontell D, MD   Stopped at 04/26/18 1131  . cefTRIAXone (ROCEPHIN) 2 g in sodium chloride 0.9 % 100 mL IVPB  2 g Intravenous Q12H Salary, Montell D, MD 200 mL/hr at 04/26/18 0538 2 g at 04/26/18 0538  . chlorhexidine (PERIDEX) 0.12 % solution 15 mL  15 mL Mouth Rinse BID Uvaldo RisingSamaan, Maged, MD   15 mL at 04/26/18 0934  . feeding supplement (ENSURE ENLIVE) (ENSURE ENLIVE) liquid 237 mL  237 mL Oral TID BM Merwyn KatosSimonds, David B, MD   237 mL at 04/26/18 0934  . hydrALAZINE (APRESOLINE) injection 10 mg  10 mg Intravenous Q4H PRN Judithe ModestKeene, Jeremiah D, NP      .  insulin aspart (novoLOG) injection 0-15 Units  0-15 Units Subcutaneous TID WC Merwyn KatosSimonds, David B, MD   5 Units at 04/26/18 1226  . insulin aspart (novoLOG) injection 0-5 Units  0-5 Units Subcutaneous QHS Merwyn KatosSimonds, David B, MD      . losartan (COZAAR) tablet 50 mg  50 mg Oral Daily Sudini, Wardell HeathSrikar, MD      . MEDLINE mouth rinse  15 mL Mouth Rinse q12n4p Duanne LimerickSamaan, Maged, MD   15 mL at 04/26/18 1228  . multivitamin with minerals tablet 1 tablet  1 tablet Oral Daily Merwyn KatosSimonds, David B, MD   1 tablet at 04/26/18 0931  . ondansetron (ZOFRAN) injection 4 mg  4 mg Intravenous Q6H PRN Salary, Montell D, MD      . senna-docusate (Senokot-S) tablet 1 tablet  1 tablet Per Tube BID Erin FullingKasa, Kurian, MD   1 tablet at 04/26/18 0932  . vancomycin (VANCOCIN) IVPB 750 mg/150 ml premix  750 mg Intravenous  Q18H Mauri ReadingMartin, Savanna M, [facility]         Discharge Medications: Please see discharge summary for a list of discharge medications.  Relevant Imaging Results:  Relevant Lab Results:   Additional Information (SSN: [id number])  Nadalyn Deringer, Darleen CrockerBailey M, LCSW

## 2018-04-26 NOTE — Progress Notes (Signed)
Pharmacy Antibiotic Note  Natalie Knight is a 82 y.o. female admitted on 04/18/2018 with meningitis.  Pharmacy has been consulted for vancomycin, ceftriaxone, acyclovir, and ampicillin dosing. She has a known history which includes anemia chronic disease, dementia, gout, DVT on Eliquis presenting from her apartment, found unresponsive in the bed, patient nonverbal/not following commands/no eye-opening, family member started CPR, EMS brought to the emergency room, patient noted to be comatose then intubated.   Plan: Will increase vancomycin 750 mg iv q 18 hours and follow renal function closely. Levels as clinically indicated.   Continue Ampicillin 2g IV Q6hr.   Rechecked patient weight. Increased acyclovir to 900 mg iv q 12 hours with ARF resolving.   Continue Ceftriaxone 2g IV Q12hr.      Height: 5\' 5"  (165.1 cm) Weight: 198 lb 13.7 oz (90.2 kg) IBW/kg (Calculated) : 57  Temp (24hrs), Avg:98.2 F (36.8 C), Min:97.8 F (36.6 C), Max:99 F (37.2 C)  Recent Labs  Lab 04/20/18 1734 04/21/18 0411  04/21/18 1936 04/22/18 0459 04/23/18 0608 04/24/18 0217 04/25/18 0454 04/26/18 0509  WBC  --  22.1*  --   --  13.2* 12.2* 12.6* 8.9  --   CREATININE  --  1.49*   < >  --  2.34* 2.02* 1.62* 1.43* 1.04*  LATICACIDVEN  --   --   --   --   --  1.7  --   --   --   VANCOTROUGH  --   --   --  26*  --   --   --   --   --   VANCORANDOM 19  --   --   --   --   --   --   --   --    < > = values in this interval not displayed.    No Known Allergies  Antimicrobials this admission: Cefepime 8/6 x1  Ampicillin 8/6 >>  Vancomycin 8/6 >>  Ceftriaxone 8/6>> Acyclovir 8/6 >>  Microbiology results: 8/6 BCx: NG  8/6 UCx: NG  8/6 MRSA PCR: negative   Thank you for allowing pharmacy to be a part of this patient's care.  Mauri ReadingSavanna M Cayci Mcnabb, PharmD Pharmacy Resident  04/26/2018 8:01 AM

## 2018-04-26 NOTE — Progress Notes (Signed)
[facility] [place] [facility]   PATIENT NAME: Natalie Knight    MR#:  [id number]  DATE OF BIRTH:  10-13-1935  SUBJECTIVE:  CHIEF COMPLAINT:   Chief Complaint  Patient presents with  . Altered Mental Status   More awake today. 1-2 words. Afebrile  REVIEW OF SYSTEMS:  Review of Systems  Unable to perform ROS: Mental status change    DRUG ALLERGIES:  No Known Allergies VITALS:  Blood pressure (!) 160/81, pulse 70, temperature 97.8 F (36.6 C), temperature source Oral, resp. rate 20, height 5\' 5"  (1.651 m), weight 93.1 kg, SpO2 100 %. PHYSICAL EXAMINATION:  Physical Exam  HENT:  Head: Normocephalic.  Eyes: No scleral icterus.  Neck: Neck supple. No JVD present. No tracheal deviation present.  Cardiovascular: Normal rate, regular rhythm and normal heart sounds. Exam reveals no gallop.  No murmur heard. Pulmonary/Chest: Breath sounds normal. No respiratory distress. She has no wheezes. She has no rales.  Abdominal: Soft. Bowel sounds are normal. She exhibits no distension. There is no tenderness. There is no rebound.  Musculoskeletal: She exhibits no edema or tenderness.  Neurological:  Doesn't follow instructions  Skin: No rash noted. No erythema.   LABORATORY PANEL:  Female CBC Recent Labs  Lab 04/26/18 0509  WBC 13.2*  HGB 8.8*  HCT 25.5*  PLT 143*   ------------------------------------------------------------------------------------------------------------------ Chemistries  Recent Labs  Lab 04/24/18 0217  04/26/18 0509  NA 143   < > 145  K 4.3   < > 3.7  CL 112*   < > 114*  CO2 19*   < > 23  GLUCOSE 172*   < > 212*  BUN 47*   < > 32*  CREATININE 1.62*   < > 1.04*  CALCIUM 8.5*   < > 9.0  MG 2.0   < > 1.7  AST 106*  --   --   ALT 94*  --   --   ALKPHOS 40  --   --   BILITOT 0.9  --   --    < > = values in this interval not displayed.   RADIOLOGY:  Dg Chest Port 1 View  Result Date: 04/26/2018 CLINICAL DATA:  Respiratory  failure. EXAM: PORTABLE CHEST 1 VIEW COMPARISON:  Radiograph of April 23, 2018. FINDINGS: The heart size and mediastinal contours are within normal limits. No pneumothorax or significant pleural effusion is noted. Right internal jugular catheter is unchanged in position. No acute pulmonary disease is noted. The visualized skeletal structures are unremarkable. IMPRESSION: No acute cardiopulmonary abnormality seen. Electronically Signed   By: Lupita RaiderJames  Green Jr, M.D.   On: 04/26/2018 07:00   ASSESSMENT AND PLAN:   Acute respiratory failure with hypoxia. Patient was extubated.  In room air.  * Septic shock - resolved Suspected due to acute bacterial meningitis Continue ampicillin, Rocephin and acyclovir, vancomycin  MRI of the brain- no acute findings.   LP could not be done due to patient being on Eliquis.  Leukocytosis is improving.  Blood culture is negative so far.   Would continue antibiotic coverage for full 14 day course per Dr. Thad Rangereynolds.  Consulted ID  * Acute toxic and metabolic encephalopathy. Most likely secondary to above, MRI and EEG done.  * AKI due to ATN from hypotension Improved  * Elevated troponin, possible due to demanding ischemia secondary to above.  *Chronic diabetes mellitus type 2 On sliding scale.  *Hypertension.  Restart losartan.  *History of DVT Eliquis stopped due  to anemia  * Anemia of chronic disease and acute blood loss, unclear etiology. PRBC transfusion.  Monitor hemoglobin  All the records are reviewed and case discussed with Care Management/Social Worker. Management plans discussed with the patient's daughter, and they are in agreement.  CODE STATUS: DNR  TOTAL TIME TAKING CARE OF THIS PATIENT: 35 minutes.   Orie FishermanSrikar R Breeona Waid M.D on 04/26/2018 at 1:43 PM  Between 7am to 6pm - Pager - [phone]  After 6pm go to [email or web address] - Therapist, nutritionalpassword [email or web address] [facility]  [facility] [place] [facility]

## 2018-04-26 NOTE — Clinical Social Work Note (Signed)
Clinical Social Work Assessment  Patient Details  Name: Natalie Knight MRN: [id number] Date of Birth: 06/10/1936  Date of referral:  04/26/18               Reason for consult:  Facility Placement                Permission sought to share information with:  Facility Contact Representative Permission granted to share information::  Yes, Verbal Permission Granted  Name::      Skilled Nursing Facility   Agency::   [place]   Relationship::     Contact Information:     Housing/Transportation Living arrangements for the past 2 months:  Single Family Home Source of Information:  Adult Children Patient Interpreter Needed:  None Criminal Activity/Legal Involvement Pertinent to Current Situation/Hospitalization:  No - Comment as needed Significant Relationships:  Adult Children Lives with:  Self Do you feel safe going back to the place where you live?  Yes Need for family participation in patient care:  Yes (Comment)  Care giving concerns:  Patient lives alone in an apartment in [place].    Social Worker assessment / plan:  Clinical Social Worker (CSW) reviewed chart and noted that PT is recommending SNF. CSW met with patient and her daughter in law Doreen was at bedside. Patient was asleep and did not participate in assessment. Per Doreen patient lives alone in [place] and has 1 adult son and 1 adult daughter. Per Doreen patient has been to [facility] in 2014 for rehab. CSW explained SNF process and that [employer] will have to approve it. Doreen is agreeable to SNF search in [place]. FL2 complete and faxed out. Per Doreen patient's adult children will decide which SNF patient will go to.   CSW left a voicemail for patient's son Walter. CSW contacted patient's daughter Betty and made her aware of above. Betty is agreeable to SNF search and prefers [facility].   Tammy admissions coordinator at [facility] can make a bed offer and will start [employer] SNF authorization today. CSW will  continue to follow and assist as needed.  Employment status:  Disabled (Comment on whether or not currently receiving Disability), Retired Insurance information:  Managed Medicare PT Recommendations:  Skilled Nursing Facility Information / Referral to community resources:  Skilled Nursing Facility  Patient/Family's Response to care:  Patient's daughter chose [facility].   Patient/Family's Understanding of and Emotional Response to Diagnosis, Current Treatment, and Prognosis:  Patient's daughter in law and daughter were very pleasant and thanked CSW for assistance.   Emotional Assessment Appearance:  Appears stated age Attitude/Demeanor/Rapport:    Affect (typically observed):  Unable to Assess Orientation:  Oriented to Self, Fluctuating Orientation (Suspected and/or reported Sundowners) Alcohol / Substance use:  Not Applicable Psych involvement (Current and /or in the community):  No (Comment)  Discharge Needs  Concerns to be addressed:  Discharge Planning Concerns Readmission within the last 30 days:  No Current discharge risk:  Cognitively Impaired, Dependent with Mobility Barriers to Discharge:  Continued Medical Work up   ,  M, LCSW 04/26/2018, 3:47 PM  

## 2018-04-26 NOTE — Progress Notes (Signed)
OT Cancellation Note  Patient Details Name: Natalie Knight MRN: [id number] DOB: 05/10/1936   Cancelled Treatment:    Reason Eval/Treat Not Completed: Medical issues which prohibited therapy. New lab results for Hgb pending. Last known 4.9. Will continue to hold until Hgb can be confirmed and pt is medically appropriate. Per RN note, palliative care meeting with family scheduled for 11am today, 04/26/18. Will continue to follow for appropriateness of OT evaluation.   Richrd PrimeJamie Stiller, MPH, MS, OTR/L ascom [phone] 04/26/18, 7:58 AM

## 2018-04-26 NOTE — Progress Notes (Signed)
Pharmacy Electrolyte Monitoring Consult:  Pharmacy consulted to assist in monitoring and replacing electrolytes in this 82 y.o. female admitted on 04/18/2018 with Altered Mental Status  Labs:  Sodium (mmol/L)  Date Value  04/26/2018 145  10/03/2014 142   Potassium (mmol/L)  Date Value  04/26/2018 3.7  10/03/2014 3.6   Magnesium (mg/dL)  Date Value  16/10/960408/14/2019 1.7   Phosphorus (mg/dL)  Date Value  54/09/811908/14/2019 3.3   Calcium (mg/dL)  Date Value  14/78/295608/14/2019 9.0   Calcium, Total (mg/dL)  Date Value  21/30/865701/21/2016 9.3   Albumin (g/dL)  Date Value  84/69/629508/08/2018 2.7 (L)  10/01/2014 2.6 (L)    Assessment/Plan: 8/14  K = 3.7          Mg = 1.7  Will order 2 g Mg sulfate IV x 1, and KCl 20 mEq PO x 1.    Will continue to follow daily labs due to possible refeeding risk.   Mauri ReadingSavanna M Maliq Pilley, PharmD Pharmacy Resident  04/26/2018 7:30 AM

## 2018-04-26 NOTE — Care Management Important Message (Signed)
Important Message  Patient Details  Name: Natalie Knight MRN: [id number] Date of Birth: 17-Nov-1935   Medicare Important Message Given:  Yes    Olegario MessierKathy A Zhaniya Swallows 04/26/2018, 10:38 AM

## 2018-04-26 NOTE — Progress Notes (Signed)
Subjective: Patient awake and alert with minimal verbal responses. Unable to provide history.  Objective: Current vital signs: BP (!) 160/81 (BP Location: Left Arm)   Pulse 82   Temp 97.8 F (36.6 C) (Oral)   Resp 20   Ht 5\' 5"  (1.651 m)   Wt 93.1 kg   SpO2 99%   BMI 34.16 kg/m  Vital signs in last 24 hours: Temp:  [97.8 F (36.6 C)-99 F (37.2 C)] 97.8 F (36.6 C) (08/14 0753) Pulse Rate:  [70-84] 82 (08/14 1340) Resp:  [0-20] 20 (08/14 0753) BP: (130-168)/(59-102) 160/81 (08/14 0753) SpO2:  [98 %-100 %] 99 % (08/14 1340) Weight:  [90.2 kg-93.1 kg] 93.1 kg (08/14 0900)  Intake/Output from previous day: 08/13 0701 - 08/14 0700 In: 1791.3 [Blood:832; IV Piggyback:959.3] Out: 1100 [Urine:1100] Intake/Output this shift: Total I/O In: 370 [P.O.:120; IV Piggyback:250] Out: 200 [Urine:200] Nutritional status:  Diet Order            DIET - DYS 1 Room service appropriate? Yes; Fluid consistency: Nectar Thick  Diet effective now             Physical Exam   Vitals Blood pressure (!) 160/81, pulse 82, temperature 97.8 F (36.6 C), temperature source Oral, resp. rate 20, height 5\' 5"  (1.651 m), weight 93.1 kg, SpO2 99 %.  (Some of the exam changes noted are from previous clinical observations which are unchanged)  Neurological Exam Alert.  Able to follow simple commands.  More verbal and fluent Cranial Nerves: II: Discs flat bilaterally; able to tract, No gaze palsy III,IV, VI: ptosis not present, extra-ocular motions intact bilaterally V,VII: smile symmetric, facial light touch sensation normal bilaterally VIII: hearing normal bilaterally IX,X: gag reflex deferred, patient awake and alert XI: bilateral shoulder shrug XII: midline tongue extension Motor: Moves upper extremities against gravity.  Unable to break gravity in bilateral lower extremities Sensory: Pinprick and light touch intact throughout, bilaterally Gait and station deferred.  Lab Results: Basic  Metabolic Panel: Recent Labs  Lab 04/22/18 0459 04/23/18 0608 04/24/18 0217 04/25/18 0454 04/26/18 0509  NA 140 143 143 142 145  K 4.8 4.5 4.3 3.9 3.7  CL 108 110 112* 113* 114*  CO2 21* 20* 19* 24 23  GLUCOSE 153* 146* 172* 233* 212*  BUN 51* 56* 47* 42* 32*  CREATININE 2.34* 2.02* 1.62* 1.43* 1.04*  CALCIUM 7.7* 8.2* 8.5* 8.8* 9.0  MG 2.2 2.1 2.0 1.9 1.7  PHOS 4.1 3.6 3.9 4.2 3.3    Liver Function Tests: Recent Labs  Lab 04/23/18 0608 04/24/18 0217  AST 112* 106*  ALT 93* 94*  ALKPHOS 42 40  BILITOT 0.7 0.9  PROT 5.3* 5.2*  ALBUMIN 2.6* 2.7*   No results for input(s): LIPASE, AMYLASE in the last 168 hours. No results for input(s): AMMONIA in the last 168 hours.  CBC: Recent Labs  Lab 04/22/18 0459 04/23/18 0608 04/24/18 0217 04/25/18 0454 04/25/18 0727 04/26/18 0509  WBC 13.2* 12.2* 12.6* 8.9  --  13.2*  NEUTROABS  --  10.1* 10.5*  --   --   --   HGB 10.6* 10.9* 8.8* 5.1* 4.9* 8.8*  HCT 32.6* 33.4* 26.6* 15.3* 14.7* 25.5*  MCV 78.1* 76.8* 76.7* 77.2*  --  80.7  PLT 138* 154 157 139*  --  143*    Cardiac Enzymes: No results for input(s): CKTOTAL, CKMB, CKMBINDEX, TROPONINI in the last 168 hours.  Lipid Panel: No results for input(s): CHOL, TRIG, HDL, CHOLHDL, VLDL, LDLCALC in the  last 168 hours.  CBG: Recent Labs  Lab 04/25/18 1551 04/25/18 2124 04/25/18 2333 04/26/18 0751 04/26/18 1207  GLUCAP 166* 149* 156* 180* 249*    Microbiology: Results for orders placed or performed during the hospital encounter of 04/18/18  Urine culture     Status: None   Collection Time: 04/18/18 12:08 PM  Result Value Ref Range Status   Specimen Description   Final    URINE, RANDOM Performed at [facility], [address], [place], [place] [address]    Special Requests   Final    NONE Performed at [facility], [address] [place], [place] [address]    Culture   Final    NO GROWTH Performed at [facility], [address] [place], [place] [address]    Report Status 04/19/2018 FINAL  Final  Blood Culture (routine x 2)     Status: None   Collection Time: 04/18/18  1:12 PM  Result Value Ref Range Status   Specimen Description BLOOD RIGHT HAND  Final   Special Requests   Final    BOTTLES DRAWN AEROBIC AND ANAEROBIC Blood Culture results may not be optimal due to an inadequate volume of blood received in culture bottles   Culture   Final    NO GROWTH 5 DAYS Performed at [facility], [address] [place], [place] [address]    Report Status 04/23/2018 FINAL  Final  Blood Culture (routine x 2)     Status: None   Collection Time: 04/18/18  1:20 PM  Result Value Ref Range Status   Specimen Description BLOOD RIGHT [facility]  Final   Special Requests   Final    BOTTLES DRAWN AEROBIC AND ANAEROBIC Blood Culture adequate volume   Culture   Final    NO GROWTH 5 DAYS Performed at [facility], [address] [place], [place] [address]    Report Status 04/23/2018 FINAL  Final  MRSA PCR Screening     Status: None   Collection Time: 04/18/18  5:19 PM  Result Value Ref Range Status   MRSA by PCR NEGATIVE NEGATIVE Final    Comment:        The GeneXpert MRSA Assay (FDA approved for NASAL specimens only), is one component of a comprehensive MRSA colonization surveillance program. It is not intended to diagnose MRSA infection nor to guide or monitor treatment for MRSA infections. Performed at [facility], [address] [place], [place] [address]     Coagulation Studies: No results for input(s): LABPROT, INR in the last 72 hours.  Imaging: Dg Chest Port 1 View  Result Date: 04/26/2018 CLINICAL DATA:  Respiratory failure. EXAM: PORTABLE CHEST 1 VIEW COMPARISON:  Radiograph of April 23, 2018. FINDINGS: The heart size and mediastinal contours are within normal limits. No pneumothorax or significant pleural effusion is noted. Right internal jugular catheter is unchanged in position.  No acute pulmonary disease is noted. The visualized skeletal structures are unremarkable. IMPRESSION: No acute cardiopulmonary abnormality seen. Electronically Signed   By: Lupita Raider, M.D.   On: 04/26/2018 07:00    Medications:  I have reviewed the patient's current medications. Scheduled: . chlorhexidine  15 mL Mouth Rinse BID  . feeding supplement (ENSURE ENLIVE)  237 mL Oral TID BM  . insulin aspart  0-15 Units Subcutaneous TID WC  . insulin aspart  0-5 Units Subcutaneous QHS  . losartan  50 mg Oral Daily  . mouth rinse  15 mL Mouth Rinse q12n4p  . multivitamin with minerals  1 tablet Oral Daily  . senna-docusate  1 tablet Per Tube BID   Patient seen and examined.  Clinical course and management discussed.  Necessary edits performed.  I agree with the above.  Assessment and plan of care developed and discussed below.    Assessment/Plan Patient improved from previous assessment. She has definitely made some progress but still not at baseline. She is deconditioned and will require intensive PT/OT/speech.  Remains on broad spectrum antibiotics and acyclovir.  Recommendations: 1. Would continue antibiotic coverage for full 14 day course.   2. Will continue to follow prn   Thana FarrLeslie Quill Grinder, MD Neurology [phone]  04/26/2018  5:23 PM    LOS: 8 days

## 2018-04-26 NOTE — Progress Notes (Addendum)
ANTICOAGULATION CONSULT NOTE  Pharmacy Consult for apixaban dosing Indication: History of DVT on apixaban as an outpatient   No Known Allergies  Patient Measurements: Height: 5\' 5"  (165.1 cm) Weight: 198 lb 13.7 oz (90.2 kg) IBW/kg (Calculated) : 57  Heparin DW 76 kg   Vital Signs: Temp: 99 F (37.2 C) (08/14 0500) Temp Source: Oral (08/14 0500) BP: 143/102 (08/14 0500) Pulse Rate: 72 (08/14 0500)  Labs: Recent Labs    04/23/18 1447  04/24/18 0217 04/24/18 1109 04/25/18 0454 04/25/18 0727 04/26/18 0509  HGB  --    < > 8.8*  --  5.1* 4.9*  --   HCT  --   --  26.6*  --  15.3* 14.7*  --   PLT  --   --  157  --  139*  --   --   APTT 105*  --  123* 109*  --   --   --   HEPARINUNFRC 0.88*  --  0.90* 0.67  --   --   --   CREATININE  --   --  1.62*  --  1.43*  --  1.04*   < > = values in this interval not displayed.    Estimated Creatinine Clearance: 48.7 mL/min (A) (by C-G formula based on SCr of 1.04 mg/dL (H)).   Assessment: Pharmacy consulted for apixaban dosing for 82 yo female admitted with meningitis.Patient previously on apixaban for left leg DVT in 02/2018. Patient extubated on 8/10 and has been transferred to step-down.  Goal of Therapy:  Monitor platelets by anticoagulation protocol: Yes   Plan:  Apixaban held for now as patient had acute blood loss on 8/13 and received 2 units packed RBCs. Etiology unknown.   Pharmacy will continue to monitor.  Mauri ReadingSavanna M Vick Filter, PharmD Pharmacy Resident  04/26/2018 7:48 AM

## 2018-04-26 NOTE — Progress Notes (Signed)
PT Cancellation Note  Patient Details Name: Natalie Knight MRN: [id number] DOB: 26-Aug-1936   Cancelled Treatment:    Reason Eval/Treat Not Completed: Medical issues which prohibited therapy; New lab results for Hgb pending. Last known 4.9. Will continue to hold until Hgb can be confirmed and pt is medically appropriate. Per RN note, palliative care meeting with family scheduled for 11am today, 04/26/18. Will continue to follow for appropriateness of PT evaluation.    Ovidio Hanger. Scott Marrianne Sica PT, DPT 04/26/18, 8:24 AM

## 2018-04-26 NOTE — Evaluation (Signed)
Occupational Therapy Evaluation Patient Details Name: Natalie Knight MRN: [id number] DOB: Oct 22, 1935 Today's Date: 04/26/2018    History of Present Illness Pt is a81 y.o.femalewith a known history that includes DM, HTN, anemia chronic disease, dementia, gout, and DVT on Eliquis presented from her apartment, found unresponsive in the bed, patient nonverbal/not following commands/no eye-opening, family member started CPR, EMS brought to the emergency room, code stroke initiated, patient noted to be comatose, patient electively intubated, patient noted to be tachycardic, hypertensive, potassium 3.3, white count 16,000, CT angios was a negative study, CT head negative, chest x-ray negative, urinalysis noted for acute ketones/dehydration.  Assessment includes: septic shock suspected due to acute bacterial meningitis, leukocytosis, acute toxic and metabolic encephalopathy, AKI, elevated troponin possibly due to demand ischemia, HTN, and anemia of chronic disease and acute blood loss of unclear etiology.   Clinical Impression   Pt seen for OT evaluation this date. Prior to hospital admission, pt was mod indep with rollator, indep with basic ADL, and getting assist for med mgt, groceries, transportation, and errands from family. Pt lives alone in an older adult apt complex. Currently pt demonstrates impairments in all aspects of ADL and mobility 2/2 impairments in strength, activity tolerance, and impaired cognition. Pt required mod-max assist and set up with verbal cues to wash her face from bed level. Pt able to lift both arms off bed for therapist to position pillows underneath to support edema mgt. Pt/daughter in law educated in positioning; both verbalized understanding. RN notified of positioning to monitor. Pt will benefit from skilled OT to address noted impairments and functional limitations (see below for any additional details) in order to maximize safety and independence while minimizing falls risk  and caregiver burden.  Upon hospital discharge, recommend pt discharge to [facility].     Follow Up Recommendations  SNF    Equipment Recommendations  Other (comment)(TBD)    Recommendations for Other Services       Precautions / Restrictions Precautions Precautions: Fall Restrictions Weight Bearing Restrictions: No      Mobility Bed Mobility Overal bed mobility: Needs Assistance Bed Mobility: Supine to Sit;Sit to Supine     Supine to sit: +2 for physical assistance;Max assist Sit to supine: +2 for physical assistance;Max assist   General bed mobility comments: unable to attempt 2/2 fatigue after PT evaluation. Required Max +2 for mobility with PT  Transfers                 General transfer comment: Unable/unsafe to attempt    Balance                           ADL either performed or assessed with clinical judgement   ADL Overall ADL's : Needs assistance/impaired Eating/Feeding: Bed level;Maximal assistance Eating/Feeding Details (indicate cue type and reason): RN reports assist from staff Grooming: Bed level;Moderate assistance;Maximal assistance Grooming Details (indicate cue type and reason): with set up of cold then warm wash cloths and placed in R dom hand, pt able to wash approx 50% of her face with mod assist, requiring max assist for L side of face Upper Body Bathing: Bed level;Moderate assistance   Lower Body Bathing: Bed level;Maximal assistance   Upper Body Dressing : Bed level;Maximal assistance   Lower Body Dressing: Bed level;Maximal assistance     Toilet Transfer Details (indicate cue type and reason): unable to attempt  Vision Baseline Vision/History: Wears glasses Wears Glasses: At all times Patient Visual Report: No change from baseline       Perception     Praxis      Pertinent Vitals/Pain Pain Assessment: No/denies pain     Hand Dominance Right   Extremity/Trunk Assessment Upper Extremity  Assessment Upper Extremity Assessment: Generalized weakness(able to raise BUE off bed, 3+/5, noted with edema bilaterally)   Lower Extremity Assessment Lower Extremity Assessment: Generalized weakness;Defer to PT evaluation       Communication Communication Communication: No difficulties   Cognition Arousal/Alertness: Lethargic Behavior During Therapy: Flat affect Overall Cognitive Status: Impaired/Different from baseline Area of Impairment: Orientation                 Orientation Level: Disoriented to;Time;Situation             General Comments: grossly WFL, able to follow simple commands, oriented to self and place, disoriented to time/situation   General Comments       Exercises   Shoulder Instructions      Home Living Family/patient expects to be discharged to:: Private residence(pt and daughter in law in room confirmed home info) Living Arrangements: Alone Available Help at Discharge: Family;Available PRN/intermittently Type of Home: Apartment Home Access: Stairs to enter Entrance Stairs-Number of Steps: 2 steps x 2 with R rails Entrance Stairs-Rails: Right Home Layout: One level     Bathroom Shower/Tub: Producer, television/film/videoWalk-in shower   Bathroom Toilet: Handicapped height     Home Equipment: Environmental consultantWalker - 2 wheels;Walker - 4 wheels;Shower seat          Prior Functioning/Environment Level of Independence: Needs assistance  Gait / Transfers Assistance Needed: Mod Ind with amb with a rollator with history of 1 fall (tried to sit on her rollator w/o locking brakes), Ind with bed mobility and transfers ADL's / Homemaking Assistance Needed: Ind with most ADLs with family assisting with running errands, taking pt to apointments, and filling pill box; pt does not drive            OT Problem List: Decreased strength;Increased edema;Decreased knowledge of use of DME or AE;Obesity      OT Treatment/Interventions: Self-care/ADL training;Balance training;Therapeutic  exercise;Therapeutic activities;Energy conservation;DME and/or AE instruction;Patient/family education    OT Goals(Current goals can be found in the care plan section) Acute Rehab OT Goals Patient Stated Goal: to get better OT Goal Formulation: With patient/family Time For Goal Achievement: 05/10/18 Potential to Achieve Goals: Good ADL Goals Pt Will Perform Grooming: with set-up;with min assist;sitting Pt Will Transfer to Toilet: with mod assist;with +2 assist;bedside commode;ambulating(LRAD for transfer) Additional ADL Goal #1: Pt will tolerate sitting EOB for 5 minutes to perform table top ADL activities with close supervision.  OT Frequency: Min 1X/week   Barriers to D/C:            Co-evaluation              AM-PAC PT "6 Clicks" Daily Activity     Outcome Measure Help from another person eating meals?: A Lot Help from another person taking care of personal grooming?: A Lot Help from another person toileting, which includes using toliet, bedpan, or urinal?: Total Help from another person bathing (including washing, rinsing, drying)?: A Lot Help from another person to put on and taking off regular upper body clothing?: A Lot Help from another person to put on and taking off regular lower body clothing?: A Lot 6 Click Score: 11   End of Session  Activity Tolerance: Patient tolerated treatment well Patient left: in bed;with call bell/phone within reach;with bed alarm set;with family/visitor present;Other (comment)(pillows propped under BUE to minimize edema)  OT Visit Diagnosis: Other abnormalities of gait and mobility (R26.89);Muscle weakness (generalized) (M62.81)                Time: 1432-1450 OT Time Calculation (min): 18 min Charges:  OT General Charges $OT Visit: 1 Visit OT Evaluation $OT Eval Moderate Complexity: 1 Mod  Richrd PrimeJamie Stiller, MPH, MS, OTR/L ascom [phone] 04/26/18, 3:51 PM

## 2018-04-26 NOTE — Consult Note (Signed)
Date of Admission:  04/18/2018        Reason for Consult: Meningitis    Referring Provider: Sudini  No history available from patient.  Spoke to the daughter and daughter-in-law who both are limited historians.  Chart reviewed HPI: Natalie Knight is a 82 y.o. female with a history of diabetes mellitus, hypertension, vascular dementia, previous CVA recent left DVT on Eliquis was admitted to the hospital on April 18, 2018 after being found unresponsive at home.  Patient lives on her own and her daughter and daughter-in-law visits her and helps with shopping cooking etc. her daughter had seen her over the weekend of August third and fourth and patient was doing all right and was at her baseline.  On August 5 night she called her and patient was not sounding right on the phone.  She asked her what was wrong and the patient said she did not know what was wrong with her.  Told her not to worry about her.  Next morning the daughter went to her home but patient did not come to the door .  So she called her brother who had the key .  They found their mother on the bed unresponsive.  They put her on the floor and tried to resuscitate her and called 911.  When 911 reached her home patient was responding to pain and was breathing with normal blood pressure.  Sugar was more than 200.  She was [facility] ED. code stroke was initiated and patient was electively intubated.  She was noted to have a temperature of 102.8 which later increased to 104.7. she was tachycardic and hypertensive with a white count of 16,000.  CT angio   was done and it was a negative study.  CT head was negative chest x-ray was normal and the UA was noted for ketones. In the  ED she was noted to have positive meningismus or nuchal rigidity with decerebrate bilateral posturing noted and so acute sepsis was suspected along with acute bacterial meningitis.  As she was on Eliquis , lumbar puncture was not done.  She was started on vancomycin,  ceftriaxone and ampicillin.  She was seen by neurologist and acyclovir was added.  She had an EEG which was abnormal with slow complex but did not show any epileptiform discharges.  Patient had another temperature of 104.7 on August 7 at 8 AM and since then she has not had abnormal temperature.  Patient remained in ICU.  Her creatinine which was normal on admission at 1.05 peaked up to 2.02 on 04/23/2018 and is back down to 1.04 today.  The WBC on admission was 16.3 and it increased up to 28.8 on 04/20/2018 and today it is 13.2.  Her hemoglobin on admission was 13.5 and it dropped to 8.8 on April 24, 2018 and then on August 13 it was 4.9 for which she received blood transfusion and today it is 8.8.  There was no cause for the acute blood loss.  She has been extubated and  has been transferred to the floor .  She is awake and knows that she is in the hospital but is unable to give any other history.  She says her legs have been hurting her.  Past Medical History:  Diagnosis Date  . Diabetes mellitus without complication (HCC)   . Hypertension   DVT left leg GERD Gout Stroke Dementia  Past surgical history Oophorectomy Left ear tube placement.  Family history Unable to obtain from  the patient because of her confusion.  Social History   Tobacco Use  . Smoking status: Never Smoker  . Smokeless tobacco: Current User    Types: Snuff  Substance Use Topics  . Alcohol use: No  . Drug use: Not on file   Home meds from PCPs records Eliquis Aspirin Cetirizine Cyanocobalamin Neurontin Hydrochlorothiazide NovoLog Iron Losartan Mag oxide Metformin Multivitamin Fish oil Prilosec Ropinirole Senna plus  Meds in the hospital Acyclovir IV Ampicillin IV Ceftriaxone IV Vancomycin IV Hydralazine as needed Losartan daily NovoLog   Abtx:  Anti-infectives (From admission, onward)   Start     Dose/Rate Route Frequency Ordered Stop   04/26/18 2200  acyclovir (ZOVIRAX) 900 mg in dextrose  5 % 150 mL IVPB     900 mg 168 mL/hr over 60 Minutes Intravenous Every 12 hours 04/26/18 0951     04/26/18 1430  vancomycin (VANCOCIN) IVPB 750 mg/150 ml premix     750 mg 150 mL/hr over 60 Minutes Intravenous Every 18 hours 04/26/18 1324     04/24/18 2200  acyclovir (ZOVIRAX) 700 mg in dextrose 5 % 100 mL IVPB  Status:  Discontinued     700 mg 114 mL/hr over 60 Minutes Intravenous Every 12 hours 04/24/18 1542 04/26/18 0951   04/24/18 1700  vancomycin (VANCOCIN) IVPB 750 mg/150 ml premix  Status:  Discontinued     750 mg 150 mL/hr over 60 Minutes Intravenous Every 24 hours 04/24/18 1553 04/26/18 1324   04/23/18 1000  acyclovir (ZOVIRAX) 700 mg in dextrose 5 % 100 mL IVPB  Status:  Discontinued     700 mg 114 mL/hr over 60 Minutes Intravenous Every 24 hours 04/22/18 1215 04/24/18 1542   04/22/18 1120  vancomycin variable dose per unstable renal function (pharmacist dosing)  Status:  Discontinued      Does not apply See admin instructions 04/22/18 1125 04/22/18 1232   04/21/18 2000  vancomycin (VANCOCIN) IVPB 1000 mg/200 mL premix  Status:  Discontinued     1,000 mg 200 mL/hr over 60 Minutes Intravenous Every 24 hours 04/21/18 0923 04/21/18 2028   04/18/18 2200  acyclovir (ZOVIRAX) 700 mg in dextrose 5 % 100 mL IVPB  Status:  Discontinued     700 mg 114 mL/hr over 60 Minutes Intravenous Every 12 hours 04/18/18 1713 04/22/18 1215   04/18/18 2000  ampicillin (OMNIPEN) 2 g in sodium chloride 0.9 % 100 mL IVPB  Status:  Discontinued     2 g 300 mL/hr over 20 Minutes Intravenous Every 4 hours 04/18/18 1617 04/18/18 1713   04/18/18 2000  vancomycin (VANCOCIN) IVPB 1000 mg/200 mL premix  Status:  Discontinued     1,000 mg 200 mL/hr over 60 Minutes Intravenous Every 18 hours 04/18/18 1621 04/18/18 1738   04/18/18 2000  vancomycin (VANCOCIN) IVPB 1000 mg/200 mL premix  Status:  Discontinued     1,000 mg 200 mL/hr over 60 Minutes Intravenous Every 24 hours 04/18/18 1738 04/20/18 2020   04/18/18  1800  ampicillin (OMNIPEN) 2 g in sodium chloride 0.9 % 100 mL IVPB     2 g 300 mL/hr over 20 Minutes Intravenous Every 6 hours 04/18/18 1713     04/18/18 1730  cefTRIAXone (ROCEPHIN) 2 g in sodium chloride 0.9 % 100 mL IVPB    Note to Pharmacy:  Pharmacy to dose   2 g 200 mL/hr over 30 Minutes Intravenous Every 12 hours 04/18/18 1541     04/18/18 1500  ampicillin (OMNIPEN) 2 g in sodium  chloride 0.9 % 100 mL IVPB  Status:  Discontinued     2 g 300 mL/hr over 20 Minutes Intravenous  Once 04/18/18 1342 04/18/18 1429   04/18/18 1430  ampicillin (OMNIPEN) 2 g in sodium chloride 0.9 % 100 mL IVPB     2 g 300 mL/hr over 20 Minutes Intravenous  Once 04/18/18 1428 04/18/18 1633   04/18/18 1400  acyclovir (ZOVIRAX) 680 mg in dextrose 5 % 100 mL IVPB     680 mg 113.6 mL/hr over 60 Minutes Intravenous  Once 04/18/18 1342 04/18/18 1726   04/18/18 1215  vancomycin (VANCOCIN) IVPB 1000 mg/200 mL premix     1,000 mg 200 mL/hr over 60 Minutes Intravenous  Once 04/18/18 1208 04/18/18 1510   04/18/18 1215  ceFEPIme (MAXIPIME) 2 g in sodium chloride 0.9 % 100 mL IVPB     2 g 200 mL/hr over 30 Minutes Intravenous  Once 04/18/18 1208 04/18/18 1408       Review of Systems: Patient states she has pain in her legs. She says she does not remember anything. She does not complain of headache, cough, fever, chills, chest pain, abdominal pain, nausea or vomiting. She has a Foley catheter and does not complain of any burning or pain.   OBJECTIVE: Blood pressure 140/67, pulse 70, temperature 97.8 F (36.6 C), temperature source Oral, resp. rate 20, height 5\' 5"  (1.651 m), weight 93.1 kg, SpO2 99 %.  Physical Exam Constitutional: Awake, oriented in self and place.  No distress.  Tired.  Follows simple commands.  Obese Head.  Normocephalic.  No neck rigidity. Eyes: Full range of eye movement. ENT: Oral cavity.  Tongue coated. CVS: S1-S2, no murmur, edema legs RS: Bilateral air entry decreased in the  bases. GI: Soft, bowel sounds heard, no tenderness GU: Foley catheter in place MSK: She moves her limbs SKIN: No rash Neurologic: Grossly nonfocal Psychiatric: Flat affect Lymphatic: No obvious lymph nodes palpable  Lab Results CBC    Component Value Date/Time   WBC 13.2 (H) 04/26/2018 0509   RBC 3.16 (L) 04/26/2018 0509   HGB 8.8 (L) 04/26/2018 0509   HGB 11.0 (L) 10/03/2014 0558   HCT 25.5 (L) 04/26/2018 0509   HCT 35.2 10/03/2014 0558   PLT 143 (L) 04/26/2018 0509   PLT 236 10/03/2014 0558   MCV 80.7 04/26/2018 0509   MCV 73 (L) 10/03/2014 0558   MCH 27.9 04/26/2018 0509   MCHC 34.5 04/26/2018 0509   RDW 15.4 (H) 04/26/2018 0509   RDW 14.3 10/03/2014 0558   LYMPHSABS 0.9 (L) 04/24/2018 0217   LYMPHSABS 1.1 10/03/2014 0558   MONOABS 1.1 (H) 04/24/2018 0217   MONOABS 0.8 10/03/2014 0558   EOSABS 0.1 04/24/2018 0217   EOSABS 0.0 10/03/2014 0558   BASOSABS 0.0 04/24/2018 0217   BASOSABS 0.1 10/03/2014 0558    CMP Latest Ref Rng & Units 04/26/2018 04/25/2018 04/24/2018  Glucose 70 - 99 mg/dL 409(W) 119(J) 478(G)  BUN 8 - 23 mg/dL 95(A) 21(H) 08(M)  Creatinine 0.44 - 1.00 mg/dL 5.78(I) 6.96(E) 9.52(W)  Sodium 135 - 145 mmol/L 145 142 143  Potassium 3.5 - 5.1 mmol/L 3.7 3.9 4.3  Chloride 98 - 111 mmol/L 114(H) 113(H) 112(H)  CO2 22 - 32 mmol/L 23 24 19(L)  Calcium 8.9 - 10.3 mg/dL 9.0 4.1(L) 2.4(M)  Total Protein 6.5 - 8.1 g/dL - - 5.2(L)  Total Bilirubin 0.3 - 1.2 mg/dL - - 0.9  Alkaline Phos 38 - 126 U/L - - 40  AST 15 - 41 U/L - - 106(H)  ALT 0 - 44 U/L - - 94(H)           CK 8/6 was 161     Microbiology: BC 8/6 NG UC- NG RSA PCR Screening     Status: None   Collection Time: 04/18/18  5:19 PM  Result Value Ref Range Status   MRSA by PCR NEGATIVE NEGATIVE Final    Radiographs and labs were personally reviewed by me.   Assessment and Plan 82 year-old female with a history of diabetes mellitus, hypertension, vascular dementia who lives on her own  presents to the hospital after being found unresponsive by family members.  Had fever in the ED of 102.8  which increased to 104.  Patient apparently was normal and at baseline 2 days before admission and on the night before the admission when the daughter had called she was not sounding right on the phone.  Negative neuro imaging.  Lumbar puncture could not be done because of Eliquis has been treated empirically as bacterial meningitis since admission and also being treated as herpes encephalitis.  On admission her WBC count was 16 which are increased to 28.  Also creatinine which was normal on admission had increased during the hospitalization.  During the hospitalization she had an unexplained drop in hemoglobin to 4.9 and required blood transfusion.  I am asked to see the patient for antibiotic recommendations  Hyperthermia with SIRS and unresponsiveness on admission.  There is no evidence of urinary tract infection or bacteremia or pneumonia on admission.  Being treated empirically for bacterial meningitis and herpes encephalitis.  And it is day 9 of treatment. Without a lumbar puncture and not had assessed the patient on admission unable to comment whether the patient really had bacterial meningitis or herpes encephalitis.  However Her acute presentation without any prodromal features of fever or headache in the preceding days is somewhat uncommon for  bacterial meningitis and herpes encephalitis.   With normal MRI showing no evidence of temporal lobe enhancement , EEG not showing any PLEDs herpes encephalitis is unlikely.  Also the fever responded pretty quickly within 24 hours which is unusual for an infection. Nevertheless this could be an unusual presentation  Could her presentation have been a  case of nonexertional or classic heatstroke?  Being elderly and living on her own she could have been dehydrated. She was not on any SSRI or antipsychotics to suggest neuroleptic malignant syndrome. Could  she have had seizures?  The EEG did not show any seizure activity.  The increase in WBC, procalcitonin worsening creatinine and abnormal LFTs a suggest sepsis but this could be SIRS related to the heatstroke.  Her CPK was normal on day 1 but was never checked after that and she could  very well have had rhabdomyolysis  which could have made the creatinine go up.    Plan If lumbar puncture can be done safely now would recommend doing that as the cell count and herpes virus PCR and protein  will give us some clues. Recommend  MRI brain with contrast and if there is no evidence of temporal lobe enhancement  will discontinue acyclovir.  AKI: Resolving.  Likely a combination of dehydration, rhabdomyolysis.  She is on many antibiotics that are nephrotoxic.  We will try to de-escalate the antibiotics.  We may stop the vancomycin.  Anemia with acute blood loss in the hospital.  Hemoglobin drop of 4 g.  Received blood transfusion.  Patient  on Eliquis.  No obvious bleeding.  Diabetes mellitus on insulin  Hypertension on losartan   Left DVT on Eliquis  Discussed the management with her daughter and daughter-in-law and also with her nurse.  Lynn Ito, MD  04/26/2018, 4:54 PM

## 2018-04-26 NOTE — Evaluation (Signed)
Physical Therapy Evaluation Patient Details Name: Natalie Knight MRN: [id number] DOB: 11-05-1935 Today's Date: 04/26/2018   History of Present Illness  Pt is a81 y.o.femalewith a known history that includes DM, HTN, anemia chronic disease, dementia, gout, and DVT on Eliquis presented from her apartment, found unresponsive in the bed, patient nonverbal/not following commands/no eye-opening, family member started CPR, EMS brought to the emergency room, code stroke initiated, patient noted to be comatose, patient electively intubated, patient noted to be tachycardic, hypertensive, potassium 3.3, white count 16,000, CT angios was a negative study, CT head negative, chest x-ray negative, urinalysis noted for acute ketones/dehydration.  Assessment includes: septic shock suspected due to acute bacterial meningitis, leukocytosis, acute toxic and metabolic encephalopathy, AKI, elevated troponin possibly due to demand ischemia, HTN, and anemia of chronic disease and acute blood loss of unclear etiology.    Clinical Impression  Pt presents with deficits in strength, transfers, mobility, gait, balance, and activity tolerance.  Pt required +2 max A during sup to/from sit with max verbal and tactile cues for sequencing.  Pt was able to sit at the EOB mostly unsupported but did require occasional min A for stability.  Did not attempt to stand with the pt this session secondary to the combination of significant functional weakness and confusion.  Pt was able to answer some questions typically with yes or no answers and but was A&O to self only and mostly confused/disoriented.  Pt was able to follow simple commands only occasionally but with verbal and tactile cues was able to participate with the below therapeutic exercises.  Pt will benefit from PT services in a SNF setting upon discharge to safely address above deficits for decreased caregiver assistance and eventual return to PLOF.      Follow Up Recommendations  SNF    Equipment Recommendations  None recommended by PT    Recommendations for Other Services       Precautions / Restrictions Precautions Precautions: Fall Restrictions Weight Bearing Restrictions: No      Mobility  Bed Mobility Overal bed mobility: Needs Assistance Bed Mobility: Supine to Sit;Sit to Supine     Supine to sit: +2 for physical assistance;Max assist Sit to supine: +2 for physical assistance;Max assist   General bed mobility comments: Max verbal and tactile cues for sequencing  Transfers                 General transfer comment: Unable/unsafe to attempt  Ambulation/Gait             General Gait Details: Unable/unsafe to attempt  Stairs            Wheelchair Mobility    Modified Rankin (Stroke Patients Only)       Balance Overall balance assessment: Needs assistance Sitting-balance support: Bilateral upper extremity supported Sitting balance-Leahy Scale: Poor Sitting balance - Comments: Pt SBA with sitting at EOB with occasional min A for stability and fatigued requiring to return to supine after around 2 minutes       Standing balance comment: Unable/unsafe to attempt                             Pertinent Vitals/Pain Pain Assessment: No/denies pain    Home Living Family/patient expects to be discharged to:: Private residence(History taken from daughter Harlow Asalsie Ramberg via phone call) Living Arrangements: Alone Available Help at Discharge: Family;Available PRN/intermittently Type of Home: Apartment Home Access: Stairs to enter Entrance Stairs-Rails: Right Entrance  Stairs-Number of Steps: 1 step x 2 with R rails Home Layout: One level Home Equipment: Walker - 2 wheels;Walker - 4 wheels;Shower seat      Prior Function Level of Independence: Needs assistance   Gait / Transfers Assistance Needed: Mod Ind with amb with a rollator with no fall history, Ind with bed mobility and transfers  ADL's / Homemaking  Assistance Needed: Ind with most ADLs with family assisting with running errands, taking pt to apointments, and filling pill box; pt does not drive        Hand Dominance        Extremity/Trunk Assessment   Upper Extremity Assessment Upper Extremity Assessment: Generalized weakness    Lower Extremity Assessment Lower Extremity Assessment: Generalized weakness       Communication      Cognition Arousal/Alertness: Lethargic Behavior During Therapy: Flat affect Overall Cognitive Status: No family/caregiver present to determine baseline cognitive functioning                                 General Comments: Per daughter via phone conversation pt has a diagnosis of dementia but continues to function at a high level and is mostly independent and living alone      General Comments      Exercises Total Joint Exercises Ankle Circles/Pumps: AROM;Both;5 reps;10 reps;AAROM Quad Sets: AAROM;Both;Strengthening;5 reps;10 reps Short Arc Quad: AROM;AAROM;10 reps;15 reps;Both Hip ABduction/ADduction: AROM;Both;5 reps;10 reps;AAROM Straight Leg Raises: AROM;AAROM;Both;5 reps;10 reps Other Exercises Other Exercises: Unsupported sitting at the EOB with occasional min A for stability x 2 min   Assessment/Plan    PT Assessment Patient needs continued PT services  PT Problem List Decreased strength;Decreased activity tolerance;Decreased balance;Decreased mobility       PT Treatment Interventions DME instruction;Gait training;Stair training;Functional mobility training;Balance training;Therapeutic activities;Patient/family education    PT Goals (Current goals can be found in the Care Plan section)  Acute Rehab PT Goals PT Goal Formulation: Patient unable to participate in goal setting Time For Goal Achievement: 05/09/18 Potential to Achieve Goals: Fair    Frequency Min 2X/week   Barriers to discharge Inaccessible home environment;Decreased caregiver support       Co-evaluation               AM-PAC PT "6 Clicks" Daily Activity  Outcome Measure Difficulty turning over in bed (including adjusting bedclothes, sheets and blankets)?: Unable Difficulty moving from lying on back to sitting on the side of the bed? : Unable Difficulty sitting down on and standing up from a chair with arms (e.g., wheelchair, bedside commode, etc,.)?: Unable Help needed moving to and from a bed to chair (including a wheelchair)?: Total Help needed walking in hospital room?: Total Help needed climbing 3-5 steps with a railing? : Total 6 Click Score: 6    End of Session Equipment Utilized During Treatment: Gait belt Activity Tolerance: Patient limited by fatigue Patient left: in bed;with bed alarm set;with call bell/phone within reach;with SCD's reapplied Nurse Communication: Mobility status PT Visit Diagnosis: Muscle weakness (generalized) (M62.81);Difficulty in walking, not elsewhere classified (R26.2)    Time: 1335-1405 PT Time Calculation (min) (ACUTE ONLY): 30 min   Charges:   PT Evaluation $PT Eval Low Complexity: 1 Low PT Treatments $Therapeutic Exercise: 8-22 mins        D. Elly ModenaScott Loreda Silverio PT, DPT 04/26/18, 2:40 PM

## 2018-04-27 DIAGNOSIS — Z7901 Long term (current) use of anticoagulants: Secondary | ICD-10-CM

## 2018-04-27 DIAGNOSIS — N179 Acute kidney failure, unspecified: Secondary | ICD-10-CM

## 2018-04-27 DIAGNOSIS — Z86718 Personal history of other venous thrombosis and embolism: Secondary | ICD-10-CM

## 2018-04-27 DIAGNOSIS — Z9889 Other specified postprocedural states: Secondary | ICD-10-CM

## 2018-04-27 DIAGNOSIS — D62 Acute posthemorrhagic anemia: Secondary | ICD-10-CM

## 2018-04-27 DIAGNOSIS — E119 Type 2 diabetes mellitus without complications: Secondary | ICD-10-CM

## 2018-04-27 DIAGNOSIS — Z79899 Other long term (current) drug therapy: Secondary | ICD-10-CM

## 2018-04-27 DIAGNOSIS — G009 Bacterial meningitis, unspecified: Secondary | ICD-10-CM

## 2018-04-27 DIAGNOSIS — R509 Fever, unspecified: Secondary | ICD-10-CM

## 2018-04-27 DIAGNOSIS — R6510 Systemic inflammatory response syndrome (SIRS) of non-infectious origin without acute organ dysfunction: Secondary | ICD-10-CM

## 2018-04-27 DIAGNOSIS — Z794 Long term (current) use of insulin: Secondary | ICD-10-CM

## 2018-04-27 DIAGNOSIS — F0150 Vascular dementia without behavioral disturbance: Secondary | ICD-10-CM

## 2018-04-27 DIAGNOSIS — Z8673 Personal history of transient ischemic attack (TIA), and cerebral infarction without residual deficits: Secondary | ICD-10-CM

## 2018-04-27 DIAGNOSIS — I10 Essential (primary) hypertension: Secondary | ICD-10-CM

## 2018-04-27 LAB — CBC WITH DIFFERENTIAL/PLATELET
BAND NEUTROPHILS: 4 %
Basophils Absolute: 0 10*3/uL (ref 0–0.1)
Basophils Relative: 0 %
Blasts: 0 %
EOS ABS: 0.1 10*3/uL (ref 0–0.7)
EOS PCT: 1 %
HCT: 23.3 % — ABNORMAL LOW (ref 35.0–47.0)
Hemoglobin: 8.1 g/dL — ABNORMAL LOW (ref 12.0–16.0)
LYMPHS ABS: 1.6 10*3/uL (ref 1.0–3.6)
Lymphocytes Relative: 12 %
MCH: 27.7 pg (ref 26.0–34.0)
MCHC: 34.7 g/dL (ref 32.0–36.0)
MCV: 79.8 fL — AB (ref 80.0–100.0)
Metamyelocytes Relative: 3 %
Monocytes Absolute: 0.3 10*3/uL (ref 0.2–0.9)
Monocytes Relative: 2 %
Myelocytes: 1 %
Neutro Abs: 11.1 10*3/uL — ABNORMAL HIGH (ref 1.4–6.5)
Neutrophils Relative %: 77 %
OTHER: 0 %
PLATELETS: 151 10*3/uL (ref 150–440)
Promyelocytes Relative: 0 %
RBC: 2.91 MIL/uL — ABNORMAL LOW (ref 3.80–5.20)
RDW: 16 % — AB (ref 11.5–14.5)
WBC: 13.1 10*3/uL — ABNORMAL HIGH (ref 3.6–11.0)
nRBC: 3 /100 WBC — ABNORMAL HIGH

## 2018-04-27 LAB — BASIC METABOLIC PANEL
Anion gap: 7 (ref 5–15)
BUN: 28 mg/dL — AB (ref 8–23)
CHLORIDE: 113 mmol/L — AB (ref 98–111)
CO2: 26 mmol/L (ref 22–32)
Calcium: 9.4 mg/dL (ref 8.9–10.3)
Creatinine, Ser: 0.9 mg/dL (ref 0.44–1.00)
GFR calc Af Amer: >60 mL/min (ref >60)
GFR calc non Af Amer: 59 mL/min — ABNORMAL LOW (ref >60)
GLUCOSE: 255 mg/dL — AB (ref 70–99)
Potassium: 3.3 mmol/L — ABNORMAL LOW (ref 3.5–5.1)
Sodium: 146 mmol/L — ABNORMAL HIGH (ref 135–145)

## 2018-04-27 LAB — GLUCOSE, CAPILLARY
GLUCOSE-CAPILLARY: 282 mg/dL — AB (ref 70–99)
Glucose-Capillary: 236 mg/dL — ABNORMAL HIGH (ref 70–99)
Glucose-Capillary: 271 mg/dL — ABNORMAL HIGH (ref 70–99)
Glucose-Capillary: 265 mg/dL — ABNORMAL HIGH (ref 70–99)
Glucose-Capillary: 298 mg/dL — ABNORMAL HIGH (ref 70–99)

## 2018-04-27 LAB — MAGNESIUM: Magnesium: 1.7 mg/dL (ref 1.7–2.4)

## 2018-04-27 MED ORDER — INSULIN ASPART 100 UNIT/ML ~~LOC~~ SOLN
3.0000 [IU] | Freq: Three times a day (TID) | SUBCUTANEOUS | Status: DC
  Administered 2018-04-27 – 2018-05-01 (×10): 3 [IU] via SUBCUTANEOUS
  Filled 2018-04-27 (×11): qty 1

## 2018-04-27 MED ORDER — MAGNESIUM SULFATE 2 GM/50ML IV SOLN
2.0000 g | Freq: Once | INTRAVENOUS | Status: AC
  Administered 2018-04-27: 2 g via INTRAVENOUS
  Filled 2018-04-27: qty 50

## 2018-04-27 MED ORDER — INSULIN GLARGINE 100 UNIT/ML ~~LOC~~ SOLN
5.0000 [IU] | Freq: Every day | SUBCUTANEOUS | Status: DC
  Administered 2018-04-27 – 2018-05-01 (×5): 5 [IU] via SUBCUTANEOUS
  Filled 2018-04-27 (×6): qty 0.05

## 2018-04-27 MED ORDER — MAGNESIUM OXIDE 400 (241.3 MG) MG PO TABS
400.0000 mg | ORAL_TABLET | Freq: Two times a day (BID) | ORAL | Status: DC
  Administered 2018-04-28 – 2018-05-04 (×10): 400 mg via ORAL
  Filled 2018-04-27 (×10): qty 1

## 2018-04-27 MED ORDER — POTASSIUM CHLORIDE CRYS ER 20 MEQ PO TBCR
40.0000 meq | EXTENDED_RELEASE_TABLET | Freq: Once | ORAL | Status: AC
  Administered 2018-04-27: 40 meq via ORAL
  Filled 2018-04-27: qty 2

## 2018-04-27 MED ORDER — DEXTROSE 5 % IV SOLN
700.0000 mg | Freq: Two times a day (BID) | INTRAVENOUS | Status: DC
  Filled 2018-04-27: qty 14

## 2018-04-27 MED ORDER — DEXTROSE 5 % IV SOLN
700.0000 mg | Freq: Three times a day (TID) | INTRAVENOUS | Status: DC
  Administered 2018-04-27 – 2018-04-28 (×2): 700 mg via INTRAVENOUS
  Filled 2018-04-27 (×4): qty 14

## 2018-04-27 MED ORDER — MAGNESIUM SULFATE 2 GM/50ML IV SOLN
2.0000 g | Freq: Once | INTRAVENOUS | Status: DC
Start: 2018-04-27 — End: 2018-05-01
  Filled 2018-04-27: qty 50

## 2018-04-27 NOTE — Progress Notes (Signed)
Occupational Therapy Treatment Patient Details Name: Natalie Knight MRN: [id number] DOB: [birth year] Today's Date: 04/27/2018    History of present illness Pt is a81 y.o.femalewith a known history that includes DM, HTN, anemia chronic disease, dementia, gout, and DVT on Eliquis presented from her apartment, found unresponsive in the bed, patient nonverbal/not following commands/no eye-opening, family member started CPR, EMS brought to the emergency room, code stroke initiated, patient noted to be comatose, patient electively intubated, patient noted to be tachycardic, hypertensive, potassium 3.3, white count 16,000, CT angios was a negative study, CT head negative, chest x-ray negative, urinalysis noted for acute ketones/dehydration.  Assessment includes: septic shock suspected due to acute bacterial meningitis, leukocytosis, acute toxic and metabolic encephalopathy, AKI, elevated troponin possibly due to demand ischemia, HTN, and anemia of chronic disease and acute blood loss of unclear etiology.   OT comments  Pt seen for OT tx this date. Pt long sitting in bed with HOB elevated, family in room. Pt alert and oriented, agreeable to therapy. Pt denies pain. Pt instructed in BUE there-ex (see below for details). BUE noted to still be edematous; not positioned on pillows. Family instructed in proper positioning and retrograde massage provided by therapist to minimize edema in order to maximize independence with ADL tasks. Pt more alert and participatory today during session. Pt endorses feeling better today vs yesterday. Pt continues to benefit from skilled OT Services to address noted impairments and functional deficits. STR remains appropriate.   Follow Up Recommendations  SNF    Equipment Recommendations  Other (comment)(TBD)    Recommendations for Other Services      Precautions / Restrictions Precautions Precautions: Fall Restrictions Weight Bearing Restrictions: No       Mobility  Bed Mobility                  Transfers                      Balance                                           ADL either performed or assessed with clinical judgement   ADL Overall ADL's : Needs assistance/impaired     Grooming: Bed level;Moderate assistance;Minimal assistance;Wash/dry hands                                       Vision Baseline Vision/History: Wears glasses Wears Glasses: At all times Patient Visual Report: No change from baseline     Perception     Praxis      Cognition Arousal/Alertness: Awake/alert Behavior During Therapy: WFL for tasks assessed/performed Overall Cognitive Status: Within Functional Limits for tasks assessed                                          Exercises Other Exercises Other Exercises: Pt/family in room educated in positioning of BUE on pillows to minimize edema Other Exercises: OT provided retrograde massage to BUE to support edema control Other Exercises: Pt instructed in BUE ther-ex including shoulder flexion/ext/abd/adduction and elbow flex/ext, 1x10 each   Shoulder Instructions       General Comments      Pertinent  Vitals/ Pain       Pain Assessment: No/denies pain  Home Living                                          Prior Functioning/Environment              Frequency  Min 1X/week        Progress Toward Goals  OT Goals(current goals can now be found in the care plan section)  Progress towards OT goals: Progressing toward goals  Acute Rehab OT Goals Patient Stated Goal: to get better OT Goal Formulation: With patient/family Time For Goal Achievement: 05/10/18 Potential to Achieve Goals: Good  Plan Discharge plan remains appropriate;Frequency remains appropriate    Co-evaluation                 AM-PAC PT "6 Clicks" Daily Activity     Outcome Measure   Help from another person eating meals?: A  Lot Help from another person taking care of personal grooming?: A Lot Help from another person toileting, which includes using toliet, bedpan, or urinal?: Total Help from another person bathing (including washing, rinsing, drying)?: A Lot Help from another person to put on and taking off regular upper body clothing?: A Lot Help from another person to put on and taking off regular lower body clothing?: A Lot 6 Click Score: 11    End of Session    OT Visit Diagnosis: Other abnormalities of gait and mobility (R26.89);Muscle weakness (generalized) (M62.81)   Activity Tolerance Patient tolerated treatment well   Patient Left in bed;with call bell/phone within reach;with bed alarm set;with nursing/sitter in room;with family/visitor present;Other (comment)(BUE propped on pillows)   Nurse Communication          Time: 1610-9604: 1311-1334 OT Time Calculation (min): 23 min  Charges: OT General Charges $OT Visit: 1 Visit OT Treatments $Therapeutic Exercise: 23-37 mins   Richrd PrimeJamie Stiller, MPH, MS, OTR/L ascom [phone] 04/27/18, 2:02 PM

## 2018-04-27 NOTE — Progress Notes (Addendum)
Speech Language Pathology Treatment: Dysphagia  Patient Details Name: Natalie Knight MRN: [id number] DOB: [birth year] Today's Date: 04/27/2018 Time: [id number] SLP Time Calculation (min) (ACUTE ONLY): 52 min  Assessment / Plan / Recommendation Clinical Impression  Pt seen for ongoing assessment of toleration of oral diet; trials to upgrade diet as appropriate. Pt appears more alert and awake vs earlier in the week. She is verbally responsive answering basic questions re: self and following basic commands to participate in po trials w/ SLP.  Pt appears to present w/ just min oropharyngeal phase dysphagia noted w/ slight-min increased risk for aspiration w/ oral intake - this risk is significantly reduced when pt follows general aspiration precautions including SMALL sips of liquids SLOWLY. Pt's overall Cognitive and Medical status' continues to improve daily but she remains weak and easily fatigued w/ exertion/tasks. Pt consumed trials of thin liquids via CUP, purees, and soft solids w/ no consistent, overt s/s of aspiration noted except 1x when she was taking multiple sips of thin liquids via Cup(pt tended to drink w/out break(may have been thirsty for the water). No further s/s noted when taking smaller sips, Slowly. No decline in vocal quality or respiratory status during oral intake. Oral phase was grossly [facility] for liquids and purees for timely oral phase management and clearing of these boluses, however, pt exhibited slight-min increased oral phase time w/ soft solids for thorough mastication/clearing. Given time, oral clearing was achieved b/t these trials; also encouraged pt to use lingual sweeping to fully clear as needed. Pt exhibited a strong cough reflex and response to the 1 trial of water. Pt fed self given setup and support - Holding her own Cup to drink. Recommend a Mech soft diet d/t overall weak status; thin liquids VIA CUP - NO STRAWS. General aspiration precautions; PILLS WHOLE IN PUREE.  Setup and feeding support at meals - ALLOW PT TO HOLD OWN CUP TO DRINK. NSG updated. ST services will continue to f/u w/ pt's toleration of oral diet; education on aspiration precautions; education w/ family. Family present in room toward end of session were presented w/ education on pt's aspiration precautions as posted and need for upright positioning w/ any oral intake.     HPI HPI: Pt is a 82 year old female with PMHx of DM, HTN who is admitted after being found unresponsive by friends, was intubated on 8/6 for airway protection, now extubated, with suspected bacterial meningitis vs CVA. Per MD and Neurology notes, pt is more awake and alert and able to provide some minimal history today with prompting. She denies any discomfort. Per nursing report, she was noted to sit at the side of bed without assistance. Family at bedside.      SLP Plan  Continue with current plan of care       Recommendations  Diet recommendations: Dysphagia 3 (mechanical soft);Thin liquid(well cut meats) Liquids provided via: Cup;No straw Medication Administration: Whole meds with puree(as able or Crushed as needed) Supervision: Staff to assist with self feeding;Patient able to self feed;Full supervision/cueing for compensatory strategies(pt can hold cup) Compensations: Minimize environmental distractions;Slow rate;Small sips/bites;Lingual sweep for clearance of pocketing;Multiple dry swallows after each bite/sip;Follow solids with liquid Postural Changes and/or Swallow Maneuvers: Seated upright 90 degrees;Upright 30-60 min after meal                General recommendations: (Dietician following) Oral Care Recommendations: Oral care BID;Staff/trained caregiver to provide oral care Follow up Recommendations: None(TBD) SLP Visit Diagnosis: Dysphagia, oropharyngeal phase (R13.12) Plan:  Continue with current plan of care       GO                 Jerilynn SomKatherine Misty Foutz, MS, CCC-SLP [name] 04/27/2018,  12:46 PM

## 2018-04-27 NOTE — Progress Notes (Signed)
Natalie Knight is a 82 y.o. female with a history of diabetes mellitus, hypertension, vascular dementia, previous CVA recent left DVT on Eliquis was admitted to the hospital on April 18, 2018 after being found unresponsive at home.  Patient lives on her own and her daughter and daughter-in-law visits her and helps with shopping cooking etc. her daughter had seen her over the weekend of August third and fourth and patient was doing all right and was at her baseline.  On August 5 night she called her and patient was not sounding right on the phone.  She asked her what was wrong and the patient said she did not know what was wrong with her.  Told her not to worry about her.  Next morning the daughter went to her home but patient did not come to the door .  So she called her brother who had the key .  They found their mother on the bed unresponsive.  They put her on the floor and tried to resuscitate her and called 911.  When 911 reached her home patient was responding to pain and was breathing with normal blood pressure.  Sugar was more than 200.  She was brought to [facility] ED. code stroke was initiated and patient was electively intubated.  She was noted to have a temperature of 102.8 which later increased to 104.7. she was tachycardic and hypertensive with a white count of 16,000.  CT angio   was done and it was a negative study.  CT head was negative chest x-ray was normal and the UA was noted for ketones. In the  ED she was noted to have positive meningismus or nuchal rigidity with decerebrate bilateral posturing noted and so acute sepsis was suspected along with acute bacterial meningitis.  As she was on Eliquis , lumbar puncture was not done.  She was started on vancomycin, ceftriaxone and ampicillin.  She was seen by neurologist and acyclovir was added.  She had an EEG which was abnormal with slow complex but did not show any epileptiform discharges.  Patient had another temperature of 104.7 on August 7 at 8 AM and  since then she has not had abnormal temperature.  Patient remained in ICU.  Her creatinine which was normal on admission at 1.05 peaked up to 2.02 on 04/23/2018 and is back down to 1.04 today.  The WBC on admission was 16.3 and it increased up to 28.8 on 04/20/2018 and today it is 13.2.  Her hemoglobin on admission was 13.5 and it dropped to 8.8 on April 24, 2018 and then on August 13 it was 4.9 for which she received blood transfusion and today it is 8.8.  There was no cause for the acute blood loss.  She has been extubated and  has been transferred to the floor .  She is awake and knows that she is in the hospital but is unable to give any other history.  She says her legs have been hurting her.  Meds in the hospital Acyclovir IV Ampicillin IV Ceftriaxone IV Vancomycin IV Hydralazine as needed Losartan daily NovoLog  Subjective Feeling better Sitting at the edge of bed and participating in PT. Slow to respond Some m  OBJECTIVE:BP (!) 138/52 (BP Location: Left Arm)   Pulse 87   Temp 98.7 F (37.1 C) (Oral)   Resp 18   Ht 5\' 5"  (1.651 m)   Wt 93 kg   SpO2 97%   BMI 34.12 kg/m   Physical Exam  Constitutional: More Awake, oriented in self and place.  No distress.  slow to respond. Knows her family members, participating in PT  Head.  Normocephalic.  No neck rigidity. Eyes: Full range of eye movement. ENT: Oral cavity.  Tongue coated. CVS: S1-S2, no murmur, edema legs RS: Bilateral air entry decreased in the bases. GI: Soft, bowel sounds heard, no tenderness GU: Foley out Rt central line neck. MSK: She moves her limbs SKIN: No rash Neurologic: Grossly nonfocal Psychiatric: Flat affect Lymphatic: No obvious lymph nodes palpable  Lab Results CBC Labs(Brief)          Component Value Date/Time   WBC 13.2 (H) 04/26/2018 0509   RBC 3.16 (L) 04/26/2018 0509   HGB 8.8 (L) 04/26/2018 0509   HGB 11.0 (L) 10/03/2014 0558   HCT 25.5 (L) 04/26/2018 0509   HCT 35.2  10/03/2014 0558   PLT 143 (L) 04/26/2018 0509   PLT 236 10/03/2014 0558   MCV 80.7 04/26/2018 0509   MCV 73 (L) 10/03/2014 0558   MCH 27.9 04/26/2018 0509   MCHC 34.5 04/26/2018 0509   RDW 15.4 (H) 04/26/2018 0509   RDW 14.3 10/03/2014 0558   LYMPHSABS 0.9 (L) 04/24/2018 0217   LYMPHSABS 1.1 10/03/2014 0558   MONOABS 1.1 (H) 04/24/2018 0217   MONOABS 0.8 10/03/2014 0558   EOSABS 0.1 04/24/2018 0217   EOSABS 0.0 10/03/2014 0558   BASOSABS 0.0 04/24/2018 0217   BASOSABS 0.1 10/03/2014 0558      CMP Latest Ref Rng & Units 04/26/2018 04/25/2018 04/24/2018  Glucose 70 - 99 mg/dL 161(W212(H) 960(A233(H) 540(J172(H)  BUN 8 - 23 mg/dL 81(X32(H) 91(Y42(H) 78(G47(H)  Creatinine 0.44 - 1.00 mg/dL 9.56(O1.04(H) 1.30(Q1.43(H) 6.57(Q1.62(H)  Sodium 135 - 145 mmol/L 145 142 143  Potassium 3.5 - 5.1 mmol/L 3.7 3.9 4.3  Chloride 98 - 111 mmol/L 114(H) 113(H) 112(H)  CO2 22 - 32 mmol/L 23 24 19(L)  Calcium 8.9 - 10.3 mg/dL 9.0 4.6(N8.8(L) 6.2(X8.5(L)  Total Protein 6.5 - 8.1 g/dL - - 5.2(L)  Total Bilirubin 0.3 - 1.2 mg/dL - - 0.9  Alkaline Phos 38 - 126 U/L - - 40  AST 15 - 41 U/L - - 106(H)  ALT 0 - 44 U/L - - 94(H)           CK 8/6 was 161     Microbiology: BC 8/6 NG UC- NG       RSA PCR Screening     Status: None   Collection Time: 04/18/18  5:19 PM  Result Value Ref Range Status   MRSA by PCR NEGATIVE NEGATIVE Final    Radiographs and labs were personally reviewed by me.   Assessment and Plan 82 year-old female with a history of diabetes mellitus, hypertension, vascular dementia who lives on her own presents to the hospital after being found unresponsive by family members.  Had fever in the ED of 102.8  which increased to 104.  Patient apparently was normal and at baseline 2 days before admission and on the night before the admission when the daughter had called she was not sounding right on the phone.  Negative neuro imaging.  Lumbar puncture could not be done because of Eliquis has been treated  empirically as bacterial meningitis since admission and also being treated as herpes encephalitis.  On admission her WBC count was 16 which are increased to 28.  Also creatinine which was normal on admission had increased during the hospitalization.  During the hospitalization she had an unexplained drop  in hemoglobin to 4.9 and required blood transfusion.  I am asked to see the patient for antibiotic recommendations  Fever with SIRS( sepsis) and unresponsiveness on admission. Being treated empirically for bacterial meningitis and herpes encephalitis.  And it is day 10 of treatment.  There is no evidence of urinary tract infection or bacteremia or pneumonia on admission.  Without a lumbar puncture and not had assessed the patient on admission unable to comment whether the patient really had bacterial meningitis or herpes encephalitis.  However Her acute presentation without any prodromal features of fever or headache in the preceding day is somewhat uncommon for  bacterial meningitis and herpes encephalitis.   With normal MRI showing no evidence of temporal lobe enhancement , EEG not showing any PLEDs herpes encephalitis is unlikely. But the MRI was done without contrast and hence is not sensitive. The fever responded pretty quickly within 24 hours which is unusual for an infection. Nevertheless this could still be an unusual presentation of a CNS infection  Could her presentation have been a  case of nonexertional or classic heatstroke?  Being elderly and living on her own she could have been dehydrated. She was not on any SSRI or antipsychotics to suggest neuroleptic malignant syndrome. Could she have had seizures?  The EEG did not show any seizure activity.  The increase in WBC, procalcitonin worsening creatinine and abnormal LFTs  suggest sepsis but this could be SIRS related to the heatstroke.  Her CPK was normal on day 1 but was never checked after that and she could  very well have had  rhabdomyolysis  which could have made the creatinine go up.    Plan As lumbar puncture cannot  be done safely  recommend MRI brain with contrast to look for any temporal lobe enhancement- If no enhancement acyclovir can be stopped. In the absence of a MRI C contrast  or MRI showing temporal lobe enhancement will need 21 days of acyclovir which is the treatment for herpes encephalitis. For bacterial meningitis the treatment for pneumococcus is 10-14 days.  ceftriaxone to be  given for another 4 days ( 8/19) Ampicillin is being given  Empirically for   listeria and is usually a 21 day course ( 05/08/18) . If MRI is normal then would discontinue on 8/19 Discontinued vancomycin as nephrotoxic (along with acyclovir)  AKI: Resolving.  Likely a combination of dehydration, rhabdomyolysis.  She is on many antibiotics that are nephrotoxic.  We will try to de-escalate the antibiotics.  We may stop the vancomycin.  Anemia with acute blood loss in the hospital.  Hemoglobin drop of 4 g.  Received blood transfusion.  Patient on Eliquis.  No obvious bleeding.  Diabetes mellitus on insulin  Hypertension on losartan   Left DVT on Eliquis  Discussed the management with her daughter and daughter-in-law and in detail with Dr.Sudini and neurologist. ID will sign off- call if needed  Lynn ItoJAYASHREE Marquitta Persichetti, MD

## 2018-04-27 NOTE — Progress Notes (Signed)
Daily Progress Note   Patient Name: Natalie Knight       Date: 04/27/2018 DOB: 02-Oct-1935  Age: 82 y.o. MRN#: [id number] Attending Physician: Milagros LollSudini, Srikar, MD Primary Care Physician: Barbette ReichmannHande, Vishwanath, MD Admit Date: 04/18/2018  Reason for Consultation/Follow-up: Establishing goals of care  Subjective: Patient more alert. Family at bedside. Reports small PO intake this morning - more than yesterday. Does c/o lower leg pain - reports tylenol was helpful.  Length of Stay: 9  Current Medications: Scheduled Meds:  . chlorhexidine  15 mL Mouth Rinse BID  . feeding supplement (ENSURE ENLIVE)  237 mL Oral TID BM  . insulin aspart  0-15 Units Subcutaneous TID WC  . insulin aspart  0-5 Units Subcutaneous QHS  . losartan  50 mg Oral Daily  . mouth rinse  15 mL Mouth Rinse q12n4p  . multivitamin with minerals  1 tablet Oral Daily  . senna-docusate  1 tablet Per Tube BID    Continuous Infusions: . sodium chloride    . acyclovir    . acyclovir Stopped (04/26/18 2237)  . ampicillin (OMNIPEN) IV Stopped (04/27/18 0602)  . cefTRIAXone (ROCEPHIN)  IV Stopped (04/27/18 0513)  . magnesium sulfate 1 - 4 g bolus IVPB 2 g (04/27/18 0945)    PRN Meds: sodium chloride, acetaminophen, albuterol, hydrALAZINE, ondansetron (ZOFRAN) IV  Physical Exam      Constitutional: No distress.  HENT:  Head: Normocephalic and atraumatic.  Cardiovascular: Normal rate and regular rhythm.  Pulmonary/Chest: Effort normal and breath sounds normal. No respiratory distress.  Abdominal: Soft. Bowel sounds are decreased.  Musculoskeletal: She exhibits edema.  Skin: Skin is warm and dry. She is not diaphoretic.  Psychiatric: Cognition and memory are impaired.   Vital Signs: BP (!) 157/67 (BP Location: Left Arm)   Pulse 77    Temp 98.1 F (36.7 C) (Oral)   Resp 16   Ht 5\' 5"  (1.651 m)   Wt 93 kg   SpO2 96%   BMI 34.12 kg/m  SpO2: SpO2: 96 % O2 Device: O2 Device: Room Air O2 Flow Rate: O2 Flow Rate (L/min): 2 L/min  Intake/output summary:   Intake/Output Summary (Last 24 hours) at 04/27/2018 1043 Last data filed at 04/27/2018 1028 Gross per 24 hour  Intake 1285 ml  Output 350 ml  Net 935 ml   LBM: Last BM Date: 04/27/18 Baseline Weight: Weight: 69.1 kg Most recent weight: Weight: 93 kg       Palliative Assessment/Data: PPS 20%    Flowsheet Rows     Most Recent Value  Intake Tab  Referral Department  Critical care  Unit at Time of Referral  ICU  Palliative Care Primary Diagnosis  Sepsis/Infectious Disease  Date Notified  04/24/18  Palliative Care Type  New Palliative care  Reason for referral  Clarify Goals of Care  Date of Admission  04/18/18  Date first seen by Palliative Care  04/26/18  # of days Palliative referral response time  2 Day(s)  # of days IP prior to Palliative referral  6  Clinical Assessment  Palliative Performance Scale Score  20%  Psychosocial & Spiritual Assessment  Palliative Care Outcomes  Patient/Family meeting held?  Yes  Who  was at the meeting?  son and daughter  Palliative Care Outcomes  Clarified goals of care, Provided psychosocial or spiritual support, ACP counseling assistance, Improved non-pain symptom therapy, Counseled regarding hospice, Linked to palliative care logitudinal support      Patient Active Problem List   Diagnosis Date Noted  . Sepsis (HCC)   . Anorexia   . Goals of care, counseling/discussion   . Palliative care by specialist   . Meningitis 04/18/2018    Palliative Care Assessment & Plan   HPI: 82 y.o. female  with past medical history of diabetes, HTN, anemia, dementia, gout, and DVT on eliquis admitted on 04/18/2018 after being found unresponsive by family. Family performed CPR and pulse was present when EMS arrived. Patient was  intubated in ED for airway protection. Extubated 8/10. Concern for bacterial meningitis versus viral encephalitis with acute encephalopathy. Altered mental status remains an issue along with poor PO intake. Patient received 2 PRBCs for hgb of 4.9 on 8/13. PMT consulted for GOC.   Assessment: Family and nursing staff report improved mentation and PO intake. Family voices concerns about edema. Asking about discharge - happy with plan to d/c to SNF with palliative to follow.   Recommendations/Plan:  - DNR  - continue medical management  - hopeful for continued improvement in mentation and PO intake - no feeding tube if no improvement  - palliative to follow on discharge - family agrees to rehab if recommended by team once stable for discharge, social work consulted  - interested in hospice if declines/ does not improve  - PMT continue to follow   Goals of Care and Additional Recommendations:  Limitations on Scope of Treatment: No Artificial Feeding  Code Status:  DNR  Prognosis:   Unable to determine  Discharge Planning:  Skilled Nursing Facility for rehab with Palliative care service follow-up  Care plan was discussed with patient's daughters  Thank you for allowing the Palliative Medicine Team to assist in the care of this patient.   Total Time 15 minutes Prolonged Time Billed  no       Greater than 50%  of this time was spent counseling and coordinating care related to the above assessment and plan.  Gerlean RenShae Lee Abdoul Encinas, DNP, [facility] HospitalGNP-C Palliative Medicine Team Team Phone # [phone]  Pager [phone]

## 2018-04-27 NOTE — Progress Notes (Signed)
New referral for outpatient Palliative to follow [facility]. No discharge date at this time. Patient information faxed to referral. Dayna BarkerKaren Robertson RN, BSN, [facility] [place], hospital Liaison [phone]

## 2018-04-27 NOTE — Progress Notes (Signed)
[facility] [facility]. Patient's son Natalie Knight is aware of above. Patient can D/C to [facility] when medically stable.   N[facility]iley Peola Joynt, LCSW [phone]

## 2018-04-27 NOTE — Progress Notes (Signed)
Patient's son Natalie Knight and daughter Natalie Knight are in agreement with patient going to [facility] and understand that [facility] authorization is pending.   Baker Hughes IncorporatedBailey [name], LCSW [phone]

## 2018-04-27 NOTE — Progress Notes (Signed)
Inpatient Diabetes Program Recommendations  AACE/ADA: Consensus Statement on Inpatient Glycemic Control (2015)  Target Ranges:  Prepandial:   less than 140 mg/dL      [facility] postprandial:   less than 180 mg/dL (1-2 hours)      Critically ill patients:  140 - 180 mg/dL   Results for Fuller PlanLLEN, Jessamyn P (MRN [id number]) as of 04/27/2018 07:50  Ref. Range 04/26/2018 07:51 04/26/2018 12:07 04/26/2018 16:54 04/26/2018 20:56  Glucose-Capillary Latest Ref Range: 70 - 99 mg/dL 409180 (H)  3 units NOVOLOG  249 (H)  5 units NOVOLOG  270 (H)  8 units NOVOLOG  265 (H)  3 units NOVOLOG    Results for Fuller PlanLLEN, Shi P (MRN [id number]) as of 04/27/2018 07:50  Ref. Range 04/27/2018 07:21  Glucose-Capillary Latest Ref Range: 70 - 99 mg/dL 956236 (H)    Home DM Meds: Metformin 1000 mg AM/ 500 mg PM                             Novolog (Has not started at home yet)  Current Orders: Novolog Moderate Correction Scale/ SSI (0-15 units) TID AC + HS      Fasting glucose elevated the last 2 days.    Afternoon CBGs also elevated yesterday as well.    MD- Please consider the following in-hospital insulin adjustments:  1. Start low dose Lantus: Lantus 5 units daily (0.05 units/kg dosing based on weight of 93 kg)  2. Start low dose Novolog Meal Coverage: Novolog 3 units TID with meals   (Please add the following Hold Parameters: Hold if pt eats <50% of meal, Hold if pt NPO)     --Will follow patient during hospitalization--  Ambrose FinlandJeannine Johnston Lema Heinkel RN, MSN, CDE Diabetes Coordinator Inpatient Glycemic Control Team Team Pager: [phone] (8a-5p)

## 2018-04-27 NOTE — Progress Notes (Signed)
Pharmacy Antibiotic Note  Natalie Knight is a 82 y.o. female admitted on 04/18/2018 with meningitis.  Pharmacy has been consulted for vancomycin, ceftriaxone, acyclovir, and ampicillin dosing. She has a known history which includes anemia chronic disease, dementia, gout, DVT on Eliquis presenting from her apartment, found unresponsive in the bed, patient nonverbal/not following commands/no eye-opening, family member started CPR, EMS brought to the emergency room, patient noted to be comatose then intubated.   Plan: Will increase vancomycin 750 mg iv q 18 hours and follow renal function closely.Will check trough on 8/19 at 1300. Levels as clinically indicated.   Continue Ampicillin 2g IV Q6hr.   Rechecked patient weight. Acyclovir dosed based on IBW. Increased to 700 mg iv q 8 hours (starting @ 1900) as renal function has improved  Continue Ceftriaxone 2g IV Q12hr.      Height: 5\' 5"  (165.1 cm) Weight: 205 lb 0.4 oz (93 kg) IBW/kg (Calculated) : 57  Temp (24hrs), Avg:98 F (36.7 C), Min:97.8 F (36.6 C), Max:98.2 F (36.8 C)  Recent Labs  Lab 04/20/18 1734  04/21/18 1936  04/23/18 0608 04/24/18 0217 04/25/18 0454 04/26/18 0509 04/26/18 1101 04/27/18 0436  WBC  --    < >  --    < > 12.2* 12.6* 8.9 13.2*  --  13.1*  CREATININE  --    < >  --    < > 2.02* 1.62* 1.43* 1.04*  --  0.90  LATICACIDVEN  --   --   --   --  1.7  --   --   --   --   --   VANCOTROUGH  --   --  26*  --   --   --   --   --  17  --   VANCORANDOM 19  --   --   --   --   --   --   --   --   --    < > = values in this interval not displayed.    No Known Allergies  Antimicrobials this admission: Cefepime 8/6 x1  Ampicillin 8/6 >>  Vancomycin 8/6 >>  Ceftriaxone 8/6>> Acyclovir 8/6 >>  Microbiology results: 8/6 BCx: NG  8/6 UCx: NG  8/6 MRSA PCR: negative   Thank you for allowing pharmacy to be a part of this patient's care.  Mauri ReadingSavanna M Camdon Saetern, PharmD Pharmacy Resident  04/27/2018 7:52 AM

## 2018-04-27 NOTE — Progress Notes (Signed)
[employer] [facility]   PATIENT NAME: Natalie Knight    MR#:  [id number]  DATE OF BIRTH:  03-28-1936  SUBJECTIVE:  CHIEF COMPLAINT:   Chief Complaint  Patient presents with  . Altered Mental Status   More awake today.  Minimally verbal Family at bedside  REVIEW OF SYSTEMS:  Review of Systems  Unable to perform ROS: Mental status change    DRUG ALLERGIES:  No Known Allergies VITALS:  Blood pressure (!) 138/52, pulse 87, temperature 98.7 F (37.1 C), temperature source Oral, resp. rate 18, height 5\' 5"  (1.651 m), weight 93 kg, SpO2 97 %. PHYSICAL EXAMINATION:  Physical Exam  HENT:  Head: Normocephalic.  Eyes: No scleral icterus.  Neck: Neck supple. No JVD present. No tracheal deviation present.  Cardiovascular: Normal rate, regular rhythm and normal heart sounds. Exam reveals no gallop.  No murmur heard. Pulmonary/Chest: Breath sounds normal. No respiratory distress. She has no wheezes. She has no rales.  Abdominal: Soft. Bowel sounds are normal. She exhibits no distension. There is no tenderness. There is no rebound.  Musculoskeletal: She exhibits no edema or tenderness.  Neurological:  More awake  Skin: No rash noted. No erythema.   LABORATORY PANEL:  Female CBC Recent Labs  Lab 04/27/18 0436  WBC 13.1*  HGB 8.1*  HCT 23.3*  PLT 151   ------------------------------------------------------------------------------------------------------------------ Chemistries  Recent Labs  Lab 04/24/18 0217  04/27/18 0436  NA 143   < > 146*  K 4.3   < > 3.3*  CL 112*   < > 113*  CO2 19*   < > 26  GLUCOSE 172*   < > 255*  BUN 47*   < > 28*  CREATININE 1.62*   < > 0.90  CALCIUM 8.5*   < > 9.4  MG 2.0   < > 1.7  AST 106*  --   --   ALT 94*  --   --   ALKPHOS 40  --   --   BILITOT 0.9  --   --    < > = values in this interval not displayed.   RADIOLOGY:  No results found. ASSESSMENT AND PLAN:   Acute respiratory failure with  hypoxia. Patient was extubated.  In room air.  * Septic shock - resolved Suspected due to acute bacterial meningitis Continue ampicillin, Rocephin and acyclovir, vancomycin  MRI of the brain- no acute findings.   LP could not be done due to patient being on Eliquis. Now she is off eliquis but LP will be low yield. Leukocytosis is improving.  Blood culture is negative so far.   Would continue antibiotic coverage for full 14 day course for now. Check MRI brain with contrast for herpes  Consulted ID and discussed with Dr. Rivka Saferravishankar  * Acute toxic and metabolic encephalopathy. Most likely secondary to above, MRI and EEG done.  * AKI due to ATN from hypotension Resolved  * Elevated troponin, possible due to demanding ischemia secondary to above.  *Chronic diabetes mellitus type 2 On sliding scale.  *Hypertension.  Restarted losartan.  *History of DVT Eliquis stopped due to anemia  * Anemia of chronic disease and acute blood loss, unclear etiology. PRBC transfusion.  Monitor hemoglobin Check stool for blood  All the records are reviewed and case discussed with Care Management/Social Worker. Management plans discussed with the patient's daughter, and they are in agreement.  CODE STATUS: DNR  TOTAL TIME TAKING CARE OF THIS PATIENT: 35 minutes.  Orie FishermanSrikar R Khy Pitre M.D on 04/27/2018 at 6:14 PM  Between 7am to 6pm - Pager - [phone]  After 6pm go to [email or web address] - Therapist, nutritionalpassword EPAS [facility]  [facility] [place] [facility]

## 2018-04-27 NOTE — Progress Notes (Signed)
Subjective: Patient is awake and alert and able to provide some minimal history today with prompting. She denies any discomfort. Per nursing report, she was noted to sit at the side of bed without assistance. Family at bedside.  Objective: Current vital signs: BP (!) 157/67 (BP Location: Left Arm)   Pulse 77   Temp 98.1 F (36.7 C) (Oral)   Resp 16   Ht 5\' 5"  (1.651 m)   Wt 93 kg   SpO2 96%   BMI 34.12 kg/m  Vital signs in last 24 hours: Temp:  [98 F (36.7 C)-98.2 F (36.8 C)] 98.1 F (36.7 C) (08/15 0724) Pulse Rate:  [70-82] 77 (08/15 0724) Resp:  [16-20] 16 (08/15 0724) BP: (130-157)/(60-67) 157/67 (08/15 0724) SpO2:  [96 %-100 %] 96 % (08/15 0724) Weight:  [93 kg] 93 kg (08/15 0500)  Intake/Output from previous day: 08/14 0701 - 08/15 0700 In: 1285 [P.O.:180; IV Piggyback:1105] Out: 350 [Urine:350] Intake/Output this shift: Total I/O In: 270 [P.O.:120; IV Piggyback:150] Out: -  Nutritional status:  Diet Order            DIET - DYS 1 Room service appropriate? Yes; Fluid consistency: Nectar Thick  Diet effective now             Physical Exam   Vitals Blood pressure (!) 157/67, pulse 77, temperature 98.1 F (36.7 C), temperature source Oral, resp. rate 16, height 5\' 5"  (1.651 m), weight 93 kg, SpO2 96 %.  (Some of the exam changes noted are from previous clinical observations)  Neurological Exam . Alert,  . Oriented to place and person  . Attention span and concentration seemed appropriate  . Language seemed impaired (naming, spontaneous speech, comprehension). Mild dysarthric speech noted with delayed responses. . I. Olfactory not examined . II: Visual fields were full. Pupils were equal, round and reactive to light and accommodation . III,IV, VI: ptosis not present, extra-ocular motions intact bilaterally . V,VII: smile symmetric, facial light touch sensation normal bilaterally . VIII: Finger rub was heard symmetric in both ears . IX, X: Palate and  uvular movements are normal and oral sensations are OK, gag reflex deffered . XI: Neck muscle strength and shoulder shrug is normal . XII: midline tongue extension . Tone is normal in all extremities, no abnormal movements seen  . Muscle strength in all extremities decreased, able to break gravity in lower bilateral extremity which is improvement from previous. . Deep tendon reflexes were symmetric  . Sensations were intact to light touch in all extremities  . Gait and station deferred due to deconditioning   Lab Results: Basic Metabolic Panel: Recent Labs  Lab 04/22/18 0459 04/23/18 0608 04/24/18 0217 04/25/18 0454 04/26/18 0509 04/27/18 0436  NA 140 143 143 142 145 146*  K 4.8 4.5 4.3 3.9 3.7 3.3*  CL 108 110 112* 113* 114* 113*  CO2 21* 20* 19* 24 23 26   GLUCOSE 153* 146* 172* 233* 212* 255*  BUN 51* 56* 47* 42* 32* 28*  CREATININE 2.34* 2.02* 1.62* 1.43* 1.04* 0.90  CALCIUM 7.7* 8.2* 8.5* 8.8* 9.0 9.4  MG 2.2 2.1 2.0 1.9 1.7 1.7  PHOS 4.1 3.6 3.9 4.2 3.3  --     Liver Function Tests: Recent Labs  Lab 04/23/18 0608 04/24/18 0217  AST 112* 106*  ALT 93* 94*  ALKPHOS 42 40  BILITOT 0.7 0.9  PROT 5.3* 5.2*  ALBUMIN 2.6* 2.7*   No results for input(s): LIPASE, AMYLASE in the last 168 hours. No results  for input(s): AMMONIA in the last 168 hours.  CBC: Recent Labs  Lab 04/23/18 0608 04/24/18 0217 04/25/18 0454 04/25/18 0727 04/26/18 0509 04/27/18 0436  WBC 12.2* 12.6* 8.9  --  13.2* 13.1*  NEUTROABS 10.1* 10.5*  --   --   --  11.1*  HGB 10.9* 8.8* 5.1* 4.9* 8.8* 8.1*  HCT 33.4* 26.6* 15.3* 14.7* 25.5* 23.3*  MCV 76.8* 76.7* 77.2*  --  80.7 79.8*  PLT 154 157 139*  --  143* 151    Cardiac Enzymes: No results for input(s): CKTOTAL, CKMB, CKMBINDEX, TROPONINI in the last 168 hours.  Lipid Panel: No results for input(s): CHOL, TRIG, HDL, CHOLHDL, VLDL, LDLCALC in the last 168 hours.  CBG: Recent Labs  Lab 04/26/18 0751 04/26/18 1207 04/26/18 1654  04/26/18 2056 04/27/18 0721  GLUCAP 180* 249* 270* 265* 236*    Microbiology: Results for orders placed or performed during the hospital encounter of 04/18/18  Urine culture     Status: None   Collection Time: 04/18/18 12:08 PM  Result Value Ref Range Status   Specimen Description   Final    URINE, RANDOM Performed at [facility], [address] [place], [place] [address]    Special Requests   Final    NONE Performed at [facility], [address] [place], [place] [address]    Culture   Final    NO GROWTH Performed at [facility], [address] [place], [place] [address]    Report Status 04/19/2018 FINAL  Final  Blood Culture (routine x 2)     Status: None   Collection Time: 04/18/18  1:12 PM  Result Value Ref Range Status   Specimen Description BLOOD RIGHT HAND  Final   Special Requests   Final    BOTTLES DRAWN AEROBIC AND ANAEROBIC Blood Culture results may not be optimal due to an inadequate volume of blood received in culture bottles   Culture   Final    NO GROWTH 5 DAYS Performed at [facility], [address] [place], [place] [address]    Report Status 04/23/2018 FINAL  Final  Blood Culture (routine x 2)     Status: None   Collection Time: 04/18/18  1:20 PM  Result Value Ref Range Status   Specimen Description [facility]OD [facility]  Final   Special Requests   Final    BOTTLES DRAWN AEROBIC AND ANAEROBIC Blood Culture adequate volume   Culture   Final    NO GROWTH 5 DAYS Performed at [facility], [address] [place], [place] [address]    Report Status 04/23/2018 FINAL  Final  MRSA PCR Screening     Status: None   Collection Time: 04/18/18  5:19 PM  Result Value Ref Range Status   MRSA by PCR NEGATIVE NEGATIVE Final    Comment:        The GeneXpert MRSA Assay (FDA approved for NASAL specimens only), is one component of a comprehensive MRSA colonization surveillance program. It is not intended to diagnose  MRSA infection nor to guide or monitor treatment for MRSA infections. Performed at [facility], [address] [place], [place] [address]     Coagulation Studies: No results for input(s): LABPROT, INR in the last 72 hours.  Imaging: Dg Chest Port 1 View  Result Date: 04/26/2018 CLINICAL DATA:  Respiratory failure. EXAM: PORTABLE CHEST 1 VIEW COMPARISON:  Radiograph of April 23, 2018. FINDINGS: The heart size and mediastinal  contours are within normal limits. No pneumothorax or significant pleural effusion is noted. Right internal jugular catheter is unchanged in position. No acute pulmonary disease is noted. The visualized skeletal structures are unremarkable. IMPRESSION: No acute cardiopulmonary abnormality seen. Electronically Signed   By: Lupita Raider, M.D.   On: 04/26/2018 07:00    Medications:  I have reviewed the patient's current medications. Scheduled: . chlorhexidine  15 mL Mouth Rinse BID  . feeding supplement (ENSURE ENLIVE)  237 mL Oral TID BM  . insulin aspart  0-15 Units Subcutaneous TID WC  . insulin aspart  0-5 Units Subcutaneous QHS  . losartan  50 mg Oral Daily  . mouth rinse  15 mL Mouth Rinse q12n4p  . multivitamin with minerals  1 tablet Oral Daily  . senna-docusate  1 tablet Per Tube BID    Patient seen and examined.  Clinical course and management discussed.  Necessary edits performed.  I agree with the above.  Assessment and plan of care developed and discussed below.     Assessment/Plan Patient continues to make remarkable  improvement from previous assessment. Appreciate PT/OT input as she will require intensive therapy due to deconditioning.Remains on broad spectrum antibiotics and acyclovir.  Recommendations: 1. Recommend to continue with antibiotic coverage for full 14 day course.  2. Will continue to follow prn    LOS: 9 days   Thana Farr, MD Neurology [phone] 04/27/2018  11:10 AM

## 2018-04-27 NOTE — Progress Notes (Signed)
Chaplain was rounding on Palliative care consult. Daughter was at the bedside and patient was drifting. She was able to speak and answer appropriately to question of how you are doing. Daughter mentioned discharge plans. Chaplain offer chaplain services. Daughter accepted prayer for family. Chaplain prayed for the family and the care team. Pt begin to drift to sleep as Chaplain departs but awake to daughters call. Chaplain will continue to follow.    04/27/18 0900  Clinical Encounter Type  Visited With Patient and family together  Visit Type Psychological support  Referral From Chaplain  Spiritual Encounters  Spiritual Needs Prayer

## 2018-04-27 NOTE — Progress Notes (Addendum)
Physical Therapy Treatment Patient Details Name: Natalie Knight MRN: [id number] DOB: December 20, 1935 Today's Date: 04/27/2018    History of Present Illness Pt is a81 y.o.femalewith a known history that includes DM, HTN, anemia chronic disease, dementia, gout, and DVT on Eliquis presented from her apartment, found unresponsive in the bed, patient nonverbal/not following commands/no eye-opening, family member started CPR, EMS brought to the emergency room, code stroke initiated, patient noted to be comatose, patient electively intubated, patient noted to be tachycardic, hypertensive, potassium 3.3, white count 16,000, CT angios was a negative study, CT head negative, chest x-ray negative, urinalysis noted for acute ketones/dehydration.  Assessment includes: septic shock suspected due to acute bacterial meningitis, leukocytosis, acute toxic and metabolic encephalopathy, AKI, elevated troponin possibly due to demand ischemia, HTN, and anemia of chronic disease and acute blood loss of unclear etiology.    PT Comments    Patient oriented to self at start of session, agreeable to PT. Patient able to participate in therapeutic exercises with AAROM for all exercises, good ability to follow commands. Supine to sit with modAx1, extensive time given to perform, verbal/tactil cues for hand placement, sequencing of movements, technique. Able to sit EOB for several minutes with fair balance with feet supported. Sit to stand attempted with maxAx1 and RW, patient unable to keep feet underneath them/knees buckling, and complaints/exhibited of fatigue after attempt, further transfers held. Returned to supine and reposition maxAx2. The patient would benefit from further skilled PT to continue to progress patient towards goals.      Follow Up Recommendations  SNF     Equipment Recommendations  None recommended by PT    Recommendations for Other Services       Precautions / Restrictions Precautions Precautions:  Fall Restrictions Weight Bearing Restrictions: No    Mobility  Bed Mobility Overal bed mobility: Needs Assistance Bed Mobility: Supine to Sit;Sit to Supine     Supine to sit: Max assist Sit to supine: +2 for physical assistance;Max assist      Transfers Overall transfer level: Needs assistance Equipment used: Rolling walker (2 wheeled) Transfers: Sit to/from Stand Sit to Stand: Max assist         General transfer comment: attempted, unsafe   Ambulation/Gait                 Stairs             Wheelchair Mobility    Modified Rankin (Stroke Patients Only)       Balance Overall balance assessment: Needs assistance Sitting-balance support: Feet supported Sitting balance-Leahy Scale: Fair Sitting balance - Comments: able to sit EOB ~265mins with intermittent UE support                                    Cognition Arousal/Alertness: Awake/alert Behavior During Therapy: WFL for tasks assessed/performed Overall Cognitive Status: Within Functional Limits for tasks assessed                                 General Comments: grossly WFL, able to follow simple commands, oriented to self and place, disoriented to time/situation      Exercises Total Joint Exercises Ankle Circles/Pumps: AROM;Both;AAROM;20 reps Short Arc QuadBarbaraann Boys: AAROM;Both;20 reps Heel Slides: AAROM;Both;20 reps Other Exercises Other Exercises: Pt/family in room educated in positioning of BUE on pillows to minimize edema Other Exercises: OT provided  retrograde massage to BUE to support edema control Other Exercises: Pt instructed in BUE ther-ex including shoulder flexion/ext/abd/adduction and elbow flex/ext, 1x10 each    General Comments        Pertinent Vitals/Pain Pain Assessment: Faces Faces Pain Scale: Hurts little more Pain Location: R knee    Home Living                      Prior Function            PT Goals (current goals can now be  found in the care plan section) Acute Rehab PT Goals Patient Stated Goal: to get better Progress towards PT goals: Progressing toward goals    Frequency    Min 2X/week      PT Plan Current plan remains appropriate    Co-evaluation              AM-PAC PT "6 Clicks" Daily Activity  Outcome Measure  Difficulty turning over in bed (including adjusting bedclothes, sheets and blankets)?: Unable Difficulty moving from lying on back to sitting on the side of the bed? : Unable Difficulty sitting down on and standing up from a chair with arms (e.g., wheelchair, bedside commode, etc,.)?: Unable Help needed moving to and from a bed to chair (including a wheelchair)?: Total Help needed walking in hospital room?: Total Help needed climbing 3-5 steps with a railing? : Total 6 Click Score: 6    End of Session Equipment Utilized During Treatment: Gait belt Activity Tolerance: Patient limited by fatigue Patient left: in bed;with bed alarm set;with call bell/phone within reach;with SCD's reapplied;with family/visitor present Nurse Communication: Mobility status PT Visit Diagnosis: Muscle weakness (generalized) (M62.81);Difficulty in walking, not elsewhere classified (R26.2)     Time: 1400-1433 PT Time Calculation (min) (ACUTE ONLY): 33 min  Charges:  $Therapeutic Exercise: 8-22 mins $Therapeutic Activity: 8-22 mins                     Olga Coasteriana Raijon Lindfors PT, DPT 2:44 PM,04/27/18 [phone]

## 2018-04-28 ENCOUNTER — Inpatient Hospital Stay: Payer: Medicare HMO

## 2018-04-28 LAB — BASIC METABOLIC PANEL
Anion gap: 7 (ref 5–15)
BUN: 21 mg/dL (ref 8–23)
CHLORIDE: 109 mmol/L (ref 98–111)
CO2: 27 mmol/L (ref 22–32)
Calcium: 9.3 mg/dL (ref 8.9–10.3)
Creatinine, Ser: 0.82 mg/dL (ref 0.44–1.00)
GFR calc non Af Amer: >60 mL/min (ref >60)
Glucose, Bld: 197 mg/dL — ABNORMAL HIGH (ref 70–99)
Potassium: 3.5 mmol/L (ref 3.5–5.1)
SODIUM: 143 mmol/L (ref 135–145)

## 2018-04-28 LAB — GLUCOSE, CAPILLARY
GLUCOSE-CAPILLARY: 198 mg/dL — AB (ref 70–99)
GLUCOSE-CAPILLARY: 166 mg/dL — AB (ref 70–99)
Glucose-Capillary: 185 mg/dL — ABNORMAL HIGH (ref 70–99)
Glucose-Capillary: 170 mg/dL — ABNORMAL HIGH (ref 70–99)

## 2018-04-28 LAB — OCCULT BLOOD X 1 CARD TO LAB, STOOL: Fecal Occult Bld: POSITIVE — AB

## 2018-04-28 LAB — HEMOGLOBIN: HEMOGLOBIN: 7.6 g/dL — AB (ref 12.0–16.0)

## 2018-04-28 LAB — MAGNESIUM: MAGNESIUM: 1.5 mg/dL — AB (ref 1.7–2.4)

## 2018-04-28 MED ORDER — MAGNESIUM SULFATE 2 GM/50ML IV SOLN
2.0000 g | Freq: Once | INTRAVENOUS | Status: AC
  Administered 2018-04-28: 2 g via INTRAVENOUS

## 2018-04-28 MED ORDER — SODIUM CHLORIDE 0.9 % IV SOLN
2.0000 g | INTRAVENOUS | Status: DC
  Administered 2018-04-28 – 2018-05-01 (×16): 2 g via INTRAVENOUS
  Filled 2018-04-28 (×3): qty 2
  Filled 2018-04-28: qty 2000
  Filled 2018-04-28: qty 2
  Filled 2018-04-28 (×2): qty 2000
  Filled 2018-04-28: qty 2
  Filled 2018-04-28: qty 2000
  Filled 2018-04-28 (×2): qty 2
  Filled 2018-04-28: qty 2000
  Filled 2018-04-28 (×5): qty 2
  Filled 2018-04-28 (×2): qty 2000
  Filled 2018-04-28 (×3): qty 2
  Filled 2018-04-28: qty 2000

## 2018-04-28 MED ORDER — SODIUM CHLORIDE 0.9% FLUSH: 10.0000 mL | INTRAVENOUS | Status: DC | PRN

## 2018-04-28 MED ORDER — POTASSIUM CHLORIDE CRYS ER 20 MEQ PO TBCR
40.0000 meq | EXTENDED_RELEASE_TABLET | Freq: Once | ORAL | Status: AC
  Administered 2018-04-28: 40 meq via ORAL
  Filled 2018-04-28: qty 2

## 2018-04-28 MED ORDER — GADOBENATE DIMEGLUMINE 529 MG/ML IV SOLN
20.0000 mL | Freq: Once | INTRAVENOUS | Status: AC | PRN
  Administered 2018-04-28: 19 mL via INTRAVENOUS

## 2018-04-28 MED ORDER — ESCITALOPRAM OXALATE 10 MG PO TABS
20.0000 mg | ORAL_TABLET | Freq: Every day | ORAL | Status: DC
  Administered 2018-04-28 – 2018-05-04 (×5): 20 mg via ORAL
  Filled 2018-04-28 (×9): qty 2

## 2018-04-28 MED ORDER — FUROSEMIDE 40 MG PO TABS
40.0000 mg | ORAL_TABLET | Freq: Every day | ORAL | Status: DC
  Administered 2018-04-28 – 2018-05-06 (×8): 40 mg via ORAL
  Filled 2018-04-28 (×8): qty 1

## 2018-04-28 MED ORDER — HYDROCHLOROTHIAZIDE 25 MG PO TABS
25.0000 mg | ORAL_TABLET | Freq: Every day | ORAL | Status: DC
  Administered 2018-04-28 – 2018-05-04 (×6): 25 mg via ORAL
  Filled 2018-04-28 (×5): qty 1

## 2018-04-28 NOTE — Progress Notes (Signed)
[employer] - [place] at [facility]   PATIENT NAME: Natalie Knight    MR#:  [id number]  DATE OF BIRTH:  09-Nov-1935  SUBJECTIVE:  CHIEF COMPLAINT:   Chief Complaint  Patient presents with  . Altered Mental Status   Awake. Still weak.  REVIEW OF SYSTEMS:  Review of Systems  Unable to perform ROS: Mental status change    DRUG ALLERGIES:  No Known Allergies VITALS:  Blood pressure (!) 157/74, pulse 74, temperature 98 F (36.7 C), temperature source Oral, resp. rate 19, height 5\' 5"  (1.651 m), weight 93 kg, SpO2 98 %. PHYSICAL EXAMINATION:  Physical Exam  HENT:  Head: Normocephalic.  Eyes: No scleral icterus.  Neck: Neck supple. No JVD present. No tracheal deviation present.  Cardiovascular: Normal rate, regular rhythm and normal heart sounds. Exam reveals no gallop.  No murmur heard. Pulmonary/Chest: Breath sounds normal. No respiratory distress. She has no wheezes. She has no rales.  Abdominal: Soft. Bowel sounds are normal. She exhibits no distension. There is no tenderness. There is no rebound.  Musculoskeletal: She exhibits no edema or tenderness.  Neurological:  More awake  Skin: No rash noted. No erythema.   LABORATORY PANEL:  Female CBC Recent Labs  Lab 04/27/18 0436 04/28/18 0732  WBC 13.1*  --   HGB 8.1* 7.6*  HCT 23.3*  --   PLT 151  --    ------------------------------------------------------------------------------------------------------------------ Chemistries  Recent Labs  Lab 04/24/18 0217  04/28/18 0732  NA 143   < > 143  K 4.3   < > 3.5  CL 112*   < > 109  CO2 19*   < > 27  GLUCOSE 172*   < > 197*  BUN 47*   < > 21  CREATININE 1.62*   < > 0.82  CALCIUM 8.5*   < > 9.3  MG 2.0   < > 1.5*  AST 106*  --   --   ALT 94*  --   --   ALKPHOS 40  --   --   BILITOT 0.9  --   --    < > = values in this interval not displayed.   RADIOLOGY:  Mr Laqueta JeanBrain W Wo Contrast  Result Date: 04/28/2018 CLINICAL DATA:  Initial evaluation  for intracranial infection, HSV encephalitis. EXAM: MRI HEAD WITHOUT AND WITH CONTRAST TECHNIQUE: Multiplanar, multiecho pulse sequences of the brain and surrounding structures were obtained without and with intravenous contrast. CONTRAST:  19mL MULTIHANCE GADOBENATE DIMEGLUMINE 529 MG/ML IV SOLN COMPARISON:  Prior MRI from 04/19/2018. FINDINGS: Brain: Study moderately degraded by motion artifact. Generalized age-related cerebral atrophy. Confluent T2/FLAIR hyperintensity within the periventricular and deep white matter both cerebral hemispheres most consistent with chronic small vessel ischemic disease, moderate nature, stable from previous. No evidence for acute infarct. Gray-white matter differentiation maintained. No acute or chronic intracranial hemorrhage. No mass lesion, midline shift or mass effect. No hydrocephalus. No extra-axial fluid collection. No abnormal enhancement. No imaging findings to suggest acute encephalitis or meningitis. Empty sella noted. Vascular: Major intravascular flow voids are maintained Skull and upper cervical spine: Craniocervical junction normal. No focal marrow replacing lesion. No scalp soft tissue abnormality. Sinuses/Orbits: Globes and orbital soft tissues within normal limits. Paranasal sinuses are clear. Bilateral mastoid effusions, left greater than right, similar to previous. Other: None. IMPRESSION: 1. No acute intracranial abnormality. No MRI evidence for acute HSV encephalitis or other intracranial infection. 2. Age-related cerebral atrophy with moderate chronic small vessel ischemic disease. Electronically Signed  By: Rise MuBenjamin  McClintock M.D.   On: 04/28/2018 03:03   ASSESSMENT AND PLAN:   Acute respiratory failure with hypoxia. Patient was extubated.  In room air.  * Septic shock - resolved Suspected due to acute bacterial meningitis Continue ampicillin, Rocephin.  MRI of the brain- no acute findings.   LP could not be done due to patient being on  Eliquis. Now she is off eliquis but LP will be low yield. Leukocytosis is improving.  Blood culture is negative so far.   Would continue antibiotic coverage for full 14 day course for till 04/30/2018 Consulted ID and discussed with Dr. Rivka Saferravishankar MRI brain with contrast normal. Acyclovir stopped  * Acute toxic and metabolic encephalopathy. Most likely secondary to above, MRI and EEG done.  * AKI due to ATN from hypotension Resolved  * Elevated troponin, possible due to demanding ischemia secondary to above.  *Chronic diabetes mellitus type 2 On sliding scale.  *Hypertension.  Restarted losartan.  *History of DVT Eliquis stopped due to anemia  * Anemia of chronic disease and acute blood loss, unclear etiology. PRBC transfusion.  Monitor hemoglobin Check stool for blood  All the records are reviewed and case discussed with Care Management/Social Worker. Management plans discussed with the patient's daughter, and they are in agreement.  CODE STATUS: DNR  TOTAL TIME TAKING CARE OF THIS PATIENT: 35 minutes.   Likely discharge on Sunday after finishing IV antibiotics  Orie FishermanSrikar R Jeremey Bascom M.D on 04/28/2018 at 2:00 PM  Between 7am to 6pm - Pager - [phone]  After 6pm go to [email or web address] - Therapist, nutritionalpassword EPAS [facility]  [facility] [place] [facility]

## 2018-04-28 NOTE — Progress Notes (Addendum)
Per MD patient will likely D/C Monday after 2 more days of IV ABX. Per [facility] [employer] [facility] is good for 3 days starting 04/27/18 and [facility] Monday. Patient and her son [name] are aware of above.   Baker Hughes IncorporatedBailey Yakir Wenke, LCSW [phone]

## 2018-04-28 NOTE — Progress Notes (Signed)
Pharmacy Electrolyte Monitoring Consult:  Pharmacy consulted to assist in monitoring and replacing electrolytes in this 82 y.o. female admitted on 04/18/2018 with Altered Mental Status  Labs:  Sodium (mmol/L)  Date Value  04/28/2018 143  10/03/2014 142   Potassium (mmol/L)  Date Value  04/28/2018 3.5  10/03/2014 3.6   Magnesium (mg/dL)  Date Value  40/98/119108/16/2019 1.5 (L)   Phosphorus (mg/dL)  Date Value  47/82/956208/14/2019 3.3   Calcium (mg/dL)  Date Value  13/08/657808/16/2019 9.3   Calcium, Total (mg/dL)  Date Value  46/96/295201/21/2016 9.3   Albumin (g/dL)  Date Value  84/13/244008/08/2018 2.7 (L)  10/01/2014 2.6 (L)    Assessment/Plan: 8/16  K = 3.5          Mg = 1.5  Patient started on MgOxide 400 mg PO BID. Will also order 2 g Mg sulfate IV x 1, and KCl 40 mEq PO x 1.    Will continue to follow daily labs due to possible refeeding risk.   Mauri ReadingSavanna M Martin, PharmD Pharmacy Resident  04/28/2018 8:46 AM

## 2018-04-28 NOTE — Progress Notes (Addendum)
Pharmacy Antibiotic Note  Natalie Knight is a 82 y.o. female admitted on 04/18/2018 with meningitis.  Pharmacy has been consulted for vancomycin, ceftriaxone, acyclovir, and ampicillin dosing. She has a known history which includes anemia chronic disease, dementia, gout, DVT on Eliquis presenting from her apartment, found unresponsive in the bed, patient nonverbal/not following commands/no eye-opening, family member started CPR, EMS brought to the emergency room, patient noted to be comatose then intubated.   Plan: Vancomycin DCed per ID on 8/15   Acyclovir D/Ced based off ID recommendations/MRI of head resulted no indication of HV encephalitis.  Continue Ampicillin 2g IV Q4hr  Continue Ceftriaxone 2g IV Q12hr.      Height: 5\' 5"  (165.1 cm) Weight: 205 lb 0.4 oz (93 kg) IBW/kg (Calculated) : 57  Temp (24hrs), Avg:98.4 F (36.9 C), Min:98 F (36.7 C), Max:98.7 F (37.1 C)  Recent Labs  Lab 04/21/18 1936  04/23/18 0608 04/24/18 0217 04/25/18 0454 04/26/18 0509 04/26/18 1101 04/27/18 0436 04/28/18 0732  WBC  --    < > 12.2* 12.6* 8.9 13.2*  --  13.1*  --   CREATININE  --    < > 2.02* 1.62* 1.43* 1.04*  --  0.90 0.82  LATICACIDVEN  --   --  1.7  --   --   --   --   --   --   VANCOTROUGH 26*  --   --   --   --   --  17  --   --    < > = values in this interval not displayed.    No Known Allergies  Antimicrobials this admission: Cefepime 8/6 x1  Ampicillin 8/6 >>  Vancomycin 8/6 >> 0815 Ceftriaxone 8/6>> Acyclovir 8/6 >>0816  Microbiology results: 8/6 BCx: NG  8/6 UCx: NG  8/6 MRSA PCR: negative   Thank you for allowing pharmacy to be a part of this patient's care.  Mauri ReadingSavanna M Martin, PharmD Pharmacy Resident  04/28/2018 8:59 AM

## 2018-04-28 NOTE — Care Management Important Message (Signed)
Important Message  Patient Details  Name: Natalie Knight MRN: [id number] Date of Birth: 1936/05/28   Medicare Important Message Given:  Yes    Olegario MessierKathy A Janney Priego 04/28/2018, 11:04 AM

## 2018-04-28 NOTE — Progress Notes (Signed)
Physical Therapy Treatment Patient Details Name: Natalie Knight MRN: [id number] DOB: May 23, 1936 Today's Date: 04/28/2018    History of Present Illness Pt is a81 y.o.femalewith a known history that includes DM, HTN, anemia chronic disease, dementia, gout, and DVT on Eliquis presented from her apartment, found unresponsive in the bed, patient nonverbal/not following commands/no eye-opening, family member started CPR, EMS brought to the emergency room, code stroke initiated, patient noted to be comatose, patient electively intubated, patient noted to be tachycardic, hypertensive, potassium 3.3, white count 16,000, CT angios was a negative study, CT head negative, chest x-ray negative, urinalysis noted for acute ketones/dehydration.  Assessment includes: septic shock suspected due to acute bacterial meningitis, leukocytosis, acute toxic and metabolic encephalopathy, AKI, elevated troponin possibly due to demand ischemia, HTN, and anemia of chronic disease and acute blood loss of unclear etiology.    PT Comments    Patient easily woken at start of session, agreeable to PT. Patient demonstrated supine to sit with maxAx1, needed increased time to maximize participation. Able to EOB and participate in exercises to promote postural control with intermittent support, CGA-minAx1 due to fatigue. Very fatigued and unsafe for sit<> stand transfer attempt at this time. Returned to bed maxAx1, able to roll L and R x2 with minAx1 during session to aid in nursing care. Good participation with exercises with verbal and tactile cues. Patient in bed with all needs in reach in NAD.    Follow Up Recommendations  SNF     Equipment Recommendations  None recommended by PT    Recommendations for Other Services       Precautions / Restrictions Precautions Precautions: Fall Restrictions Weight Bearing Restrictions: No    Mobility  Bed Mobility Overal bed mobility: Needs Assistance Bed Mobility: Supine to  Sit;Sit to Supine;Rolling Rolling: Min assist   Supine to sit: Max assist Sit to supine: Max assist      Transfers                 General transfer comment: Patient very fatigued sitting EOB, CGA/minAx1, intermittent UE propping  Ambulation/Gait                 Stairs             Wheelchair Mobility    Modified Rankin (Stroke Patients Only)       Balance Overall balance assessment: Needs assistance Sitting-balance support: Feet supported Sitting balance-Leahy Scale: Fair Sitting balance - Comments: sat EOB 5 minutes, able to participate in reaching activities, cues for posture Postural control: Right lateral lean                                  Cognition Arousal/Alertness: Awake/alert Behavior During Therapy: WFL for tasks assessed/performed Overall Cognitive Status: Within Functional Limits for tasks assessed                                 General Comments: grossly WFL, able to follow simple commands, oriented to self and place, disoriented to time/situation      Exercises Other Exercises Other Exercises: EOB sitting with intermittent support needed. Able to participate in reaching activities, x5 reaches unilateral across midline, x3 b/l reach to midline, x10 LAQ b/l, with fatigue patient needed minAx1 to maintain EOB Other Exercises: roll to R and L x2 with minAx1    General Comments  Pertinent Vitals/Pain Pain Assessment: Faces Faces Pain Scale: Hurts little more Pain Location: R knee    Home Living                      Prior Function            PT Goals (current goals can now be found in the care plan section) Progress towards PT goals: Progressing toward goals    Frequency    Min 2X/week      PT Plan Current plan remains appropriate    Co-evaluation              AM-PAC PT "6 Clicks" Daily Activity  Outcome Measure  Difficulty turning over in bed (including  adjusting bedclothes, sheets and blankets)?: Unable Difficulty moving from lying on back to sitting on the side of the bed? : Unable Difficulty sitting down on and standing up from a chair with arms (e.g., wheelchair, bedside commode, etc,.)?: Unable Help needed moving to and from a bed to chair (including a wheelchair)?: Total Help needed walking in hospital room?: Total Help needed climbing 3-5 steps with a railing? : Total 6 Click Score: 6    End of Session Equipment Utilized During Treatment: Gait belt Activity Tolerance: Patient limited by fatigue Patient left: in bed;with bed alarm set;with call bell/phone within reach;with SCD's reapplied Nurse Communication: Mobility status PT Visit Diagnosis: Muscle weakness (generalized) (M62.81);Difficulty in walking, not elsewhere classified (R26.2)     Time: 1610-96041135-1201 PT Time Calculation (min) (ACUTE ONLY): 26 min  Charges:  $Therapeutic Exercise: 8-22 mins $Therapeutic Activity: 8-22 mins                     Olga Coasteriana Karletta Millay PT, DPT 12:57 PM,04/28/18 [phone]

## 2018-04-29 LAB — BASIC METABOLIC PANEL
Anion gap: 7 (ref 5–15)
BUN: 17 mg/dL (ref 8–23)
CHLORIDE: 103 mmol/L (ref 98–111)
CO2: 31 mmol/L (ref 22–32)
Calcium: 9.6 mg/dL (ref 8.9–10.3)
Creatinine, Ser: 0.76 mg/dL (ref 0.44–1.00)
GFR calc non Af Amer: >60 mL/min (ref >60)
Glucose, Bld: 164 mg/dL — ABNORMAL HIGH (ref 70–99)
POTASSIUM: 3.4 mmol/L — AB (ref 3.5–5.1)
SODIUM: 141 mmol/L (ref 135–145)

## 2018-04-29 LAB — GLUCOSE, CAPILLARY
GLUCOSE-CAPILLARY: 163 mg/dL — AB (ref 70–99)
GLUCOSE-CAPILLARY: 207 mg/dL — AB (ref 70–99)
Glucose-Capillary: 220 mg/dL — ABNORMAL HIGH (ref 70–99)
Glucose-Capillary: 142 mg/dL — ABNORMAL HIGH (ref 70–99)

## 2018-04-29 LAB — MAGNESIUM: MAGNESIUM: 1.5 mg/dL — AB (ref 1.7–2.4)

## 2018-04-29 LAB — HEMOGLOBIN: HEMOGLOBIN: 8.2 g/dL — AB (ref 12.0–16.0)

## 2018-04-29 MED ORDER — POTASSIUM CHLORIDE CRYS ER 20 MEQ PO TBCR
20.0000 meq | EXTENDED_RELEASE_TABLET | Freq: Once | ORAL | Status: AC
  Administered 2018-04-29: 20 meq via ORAL
  Filled 2018-04-29: qty 1

## 2018-04-29 MED ORDER — METOPROLOL TARTRATE 25 MG PO TABS
25.0000 mg | ORAL_TABLET | Freq: Two times a day (BID) | ORAL | Status: DC
  Administered 2018-04-29 – 2018-05-04 (×8): 25 mg via ORAL
  Filled 2018-04-29 (×8): qty 1

## 2018-04-29 MED ORDER — MAGNESIUM SULFATE 2 GM/50ML IV SOLN
2.0000 g | Freq: Once | INTRAVENOUS | Status: AC
  Administered 2018-04-29: 2 g via INTRAVENOUS
  Filled 2018-04-29: qty 50

## 2018-04-29 NOTE — Progress Notes (Signed)
[employer] - [place] [facility]   PATIENT NAME: Natalie Knight    MR#:  [id number]  DATE OF BIRTH:  03-Sep-1936  SUBJECTIVE:  CHIEF COMPLAINT:   Chief Complaint  Patient presents with  . Altered Mental Status   Confused. REVIEW OF SYSTEMS:  Review of Systems  Unable to perform ROS: Mental status change    DRUG ALLERGIES:  No Known Allergies VITALS:  Blood pressure (!) 168/78, pulse 74, temperature 98.1 F (36.7 C), resp. rate 20, height 5\' 5"  (1.651 m), weight 92.1 kg, SpO2 97 %. PHYSICAL EXAMINATION:  Physical Exam  HENT:  Head: Normocephalic.  Eyes: No scleral icterus.  Neck: Neck supple. No JVD present. No tracheal deviation present.  Cardiovascular: Normal rate, regular rhythm and normal heart sounds. Exam reveals no gallop.  No murmur heard. Pulmonary/Chest: Breath sounds normal. No respiratory distress. She has no wheezes. She has no rales.  Abdominal: Soft. Bowel sounds are normal. She exhibits no distension. There is no tenderness. There is no rebound.  Musculoskeletal: She exhibits no edema or tenderness.  Neurological:  Confused.  Skin: No rash noted. No erythema.   LABORATORY PANEL:  Female CBC Recent Labs  Lab 04/27/18 0436  04/29/18 0513  WBC 13.1*  --   --   HGB 8.1*   < > 8.2*  HCT 23.3*  --   --   PLT 151  --   --    < > = values in this interval not displayed.   ------------------------------------------------------------------------------------------------------------------ Chemistries  Recent Labs  Lab 04/24/18 0217  04/29/18 0513  NA 143   < > 141  K 4.3   < > 3.4*  CL 112*   < > 103  CO2 19*   < > 31  GLUCOSE 172*   < > 164*  BUN 47*   < > 17  CREATININE 1.62*   < > 0.76  CALCIUM 8.5*   < > 9.6  MG 2.0   < > 1.5*  AST 106*  --   --   ALT 94*  --   --   ALKPHOS 40  --   --   BILITOT 0.9  --   --    < > = values in this interval not displayed.   RADIOLOGY:  No results found. ASSESSMENT AND PLAN:    Acute respiratory failure with hypoxia. Patient was extubated.  In room air.  * Septic shock - resolved Suspected due to acute bacterial meningitis Continue ampicillin, Rocephin.  MRI of the brain- no acute findings.   LP could not be done due to patient being on Eliquis. Now she is off eliquis but LP will be low yield. Leukocytosis is improving.  Blood culture is negative so far.   Would continue antibiotic coverage for full 14 day course for till 04/30/2018 Consulted ID and discussed with Dr. Rivka Saferravishankar MRI brain with contrast normal. Acyclovir stopped  * Acute toxic and metabolic encephalopathy. Most likely secondary to above, MRI and EEG done.  * AKI due to ATN from hypotension Resolved  * Elevated troponin, possible due to demanding ischemia secondary to above.  *Chronic diabetes mellitus type 2 On sliding scale.  *Hypertension.  Restarted losartan.  *History of DVT Eliquis stopped due to anemia  * Anemia of chronic disease and acute blood loss, unclear etiology. A/P PRBC transfusion. Hemoglobin 8.2. Positive stool for blood  Accelerated hypertension.  Resume losartan and add Lopressor twice daily. Hypokalemia and hypomagnesemia.  Given supplement and  follow-up level. All the records are reviewed and case discussed with Care Management/Social Worker. Management plans discussed with the patient's daughter, and they are in agreement.  CODE STATUS: DNR  TOTAL TIME TAKING CARE OF THIS PATIENT: 27 minutes.   Likely discharge on Sunday after finishing IV antibiotics  Shaune PollackQing Zacaria Pousson M.D on 04/29/2018 at 3:45 PM  Between 7am to 6pm - Pager - [phone]  After 6pm go to [email or web address] - Therapist, nutritionalpassword EPAS [facility]  [employer]  Hospitalists

## 2018-04-29 NOTE — Progress Notes (Signed)
Pharmacy Electrolyte Monitoring Consult:  Pharmacy consulted to assist in monitoring and replacing electrolytes in this 82 y.o. female admitted on 04/18/2018 with Altered Mental Status  Labs:  Sodium (mmol/L)  Date Value  04/29/2018 141  10/03/2014 142   Potassium (mmol/L)  Date Value  04/29/2018 3.4 (L)  10/03/2014 3.6   Magnesium (mg/dL)  Date Value  16/10/960408/17/2019 1.5 (L)   Phosphorus (mg/dL)  Date Value  54/09/811908/14/2019 3.3   Calcium (mg/dL)  Date Value  14/78/295608/17/2019 9.6   Calcium, Total (mg/dL)  Date Value  21/30/865701/21/2016 9.3   Albumin (g/dL)  Date Value  84/69/629508/08/2018 2.7 (L)  10/01/2014 2.6 (L)    Assessment/Plan: Continue Mag-Ox 400 mg PO BID.  Will also order Mag 2g IV x 1 and KCL 20 mEq PO x 1.    Will continue to follow daily labs due to possible refeeding risk.   Stormy CardKatsoudas,Masen Luallen K, [facility]PH 04/29/2018 7:41 AM

## 2018-04-30 LAB — MAGNESIUM: MAGNESIUM: 1.8 mg/dL (ref 1.7–2.4)

## 2018-04-30 LAB — GLUCOSE, CAPILLARY
GLUCOSE-CAPILLARY: 166 mg/dL — AB (ref 70–99)
GLUCOSE-CAPILLARY: 61 mg/dL — AB (ref 70–99)
GLUCOSE-CAPILLARY: 101 mg/dL — AB (ref 70–99)
Glucose-Capillary: 110 mg/dL — ABNORMAL HIGH (ref 70–99)
Glucose-Capillary: 127 mg/dL — ABNORMAL HIGH (ref 70–99)

## 2018-04-30 LAB — BASIC METABOLIC PANEL
Anion gap: 10 (ref 5–15)
BUN: 16 mg/dL (ref 8–23)
CO2: 28 mmol/L (ref 22–32)
CREATININE: 0.72 mg/dL (ref 0.44–1.00)
Calcium: 9.7 mg/dL (ref 8.9–10.3)
Chloride: 99 mmol/L (ref 98–111)
GFR calc Af Amer: >60 mL/min (ref >60)
GFR calc non Af Amer: >60 mL/min (ref >60)
GLUCOSE: 122 mg/dL — AB (ref 70–99)
Potassium: 3.8 mmol/L (ref 3.5–5.1)
SODIUM: 137 mmol/L (ref 135–145)

## 2018-04-30 NOTE — Progress Notes (Signed)
Pharmacy Electrolyte Monitoring Consult:  Pharmacy consulted to assist in monitoring and replacing electrolytes in this 82 y.o. female admitted on 04/18/2018 with Altered Mental Status  Labs:  Sodium (mmol/L)  Date Value  04/30/2018 137  10/03/2014 142   Potassium (mmol/L)  Date Value  04/30/2018 3.8  10/03/2014 3.6   Magnesium (mg/dL)  Date Value  16/10/960408/18/2019 1.8   Phosphorus (mg/dL)  Date Value  54/09/811908/14/2019 3.3   Calcium (mg/dL)  Date Value  14/78/295608/18/2019 9.7   Calcium, Total (mg/dL)  Date Value  21/30/865701/21/2016 9.3   Albumin (g/dL)  Date Value  84/69/629508/08/2018 2.7 (L)  10/01/2014 2.6 (L)    Assessment/Plan: Continue Mag-Ox 400 mg PO BID.   Will continue to follow daily labs due to possible refeeding risk.   Stormy CardKatsoudas,Ronny Ruddell K, [facility] 04/30/2018 7:38 AM

## 2018-04-30 NOTE — Progress Notes (Signed)
[employer] - [place] [facility]   PATIENT NAME: Natalie Knight    MR#:  [id number]  DATE OF BIRTH:  [birth year]  SUBJECTIVE:  CHIEF COMPLAINT:   Chief Complaint  Patient presents with  . Altered Mental Status   The patient is still confused, unable to answer questions.  She need feeding assistant. REVIEW OF SYSTEMS:  Review of Systems  Unable to perform ROS: Mental status change    DRUG ALLERGIES:  No Known Allergies VITALS:  Blood pressure (!) 121/98, pulse 69, temperature 97.8 F (36.6 C), resp. rate 18, height 5\' 5"  (1.651 m), weight 93 kg, SpO2 99 %. PHYSICAL EXAMINATION:  Physical Exam  HENT:  Head: Normocephalic.  Eyes: No scleral icterus.  Neck: Neck supple. No JVD present. No tracheal deviation present.  Cardiovascular: Normal rate, regular rhythm and normal heart sounds. Exam reveals no gallop.  No murmur heard. Pulmonary/Chest: Breath sounds normal. No respiratory distress. She has no wheezes. She has no rales.  Abdominal: Soft. Bowel sounds are normal. She exhibits no distension. There is no tenderness. There is no rebound.  Musculoskeletal: She exhibits no edema or tenderness.  Neurological:  Confused.  Skin: No rash noted. No erythema.   LABORATORY PANEL:  Female CBC Recent Labs  Lab 04/27/18 0436  04/29/18 0513  WBC 13.1*  --   --   HGB 8.1*   < > 8.2*  HCT 23.3*  --   --   PLT 151  --   --    < > = values in this interval not displayed.   ------------------------------------------------------------------------------------------------------------------ Chemistries  Recent Labs  Lab 04/24/18 0217  04/30/18 0304  NA 143   < > 137  K 4.3   < > 3.8  CL 112*   < > 99  CO2 19*   < > 28  GLUCOSE 172*   < > 122*  BUN 47*   < > 16  CREATININE 1.62*   < > 0.72  CALCIUM 8.5*   < > 9.7  MG 2.0   < > 1.8  AST 106*  --   --   ALT 94*  --   --   ALKPHOS 40  --   --   BILITOT 0.9  --   --    < > = values in this interval not  displayed.   RADIOLOGY:  No results found. ASSESSMENT AND PLAN:   Acute respiratory failure with hypoxia. Patient was extubated.  In room air.  * Septic shock - resolved Suspected due to acute bacterial meningitis Continue ampicillin, Rocephin.  MRI of the brain- no acute findings.   LP could not be done due to patient being on Eliquis. Now she is off eliquis but LP will be low yield. Leukocytosis is improving.  Blood culture is negative so far.   Would continue antibiotic coverage for full 14 day course for till 04/30/2018 Consulted ID and discussed with Dr. Rivka Saferravishankar MRI brain with contrast normal. Acyclovir stopped  * Acute toxic and metabolic encephalopathy. Most likely secondary to above, MRI and EEG done.  * AKI due to ATN from hypotension Resolved  * Elevated troponin, possible due to demanding ischemia secondary to above.  *Chronic diabetes mellitus type 2 On sliding scale.  *History of DVT Eliquis stopped due to anemia  * Anemia of chronic disease and acute blood loss, unclear etiology. A/P PRBC transfusion. Hemoglobin 8.2. Positive stool for blood  Accelerated hypertension.  Resumed losartan and added Lopressor twice  daily.  BP is controlled. Hypokalemia and hypomagnesemia.  Given supplement and follow-up level.  Improved.  I called the patient daughter at [phone], but nobody answered the phone. All the records are reviewed and case discussed with Care Management/Social Worker. Management plans discussed with the patient's daughter, and they are in agreement.  CODE STATUS: DNR  TOTAL TIME TAKING CARE OF THIS PATIENT: 27 minutes.   Likely discharge on Sunday after finishing IV antibiotics  Shaune PollackQing Ceasia Elwell M.D on 04/30/2018 at 12:50 PM  Between 7am to 6pm - Pager - [phone]  After 6pm go to [email or web address] - Therapist, nutritionalpassword EPAS [facility]  [facility] [employer] [place] Hospitalists

## 2018-04-30 NOTE — Progress Notes (Signed)
Patient resting in bed. Mittens in place. Family at bedside. Call bell in reach. Continue to monitor.

## 2018-04-30 NOTE — Progress Notes (Signed)
Pt alert but remaining confused this shift. Iv infusing without difficulty. Pt able to sleep some in between care. incontinent of stool this shift.

## 2018-05-01 DIAGNOSIS — R509 Fever, unspecified: Secondary | ICD-10-CM

## 2018-05-01 DIAGNOSIS — Z7901 Long term (current) use of anticoagulants: Secondary | ICD-10-CM

## 2018-05-01 DIAGNOSIS — R6510 Systemic inflammatory response syndrome (SIRS) of non-infectious origin without acute organ dysfunction: Secondary | ICD-10-CM

## 2018-05-01 DIAGNOSIS — I10 Essential (primary) hypertension: Secondary | ICD-10-CM

## 2018-05-01 DIAGNOSIS — Z794 Long term (current) use of insulin: Secondary | ICD-10-CM

## 2018-05-01 DIAGNOSIS — F0150 Vascular dementia without behavioral disturbance: Secondary | ICD-10-CM

## 2018-05-01 DIAGNOSIS — Z9889 Other specified postprocedural states: Secondary | ICD-10-CM

## 2018-05-01 DIAGNOSIS — Z86718 Personal history of other venous thrombosis and embolism: Secondary | ICD-10-CM

## 2018-05-01 DIAGNOSIS — Z8673 Personal history of transient ischemic attack (TIA), and cerebral infarction without residual deficits: Secondary | ICD-10-CM

## 2018-05-01 DIAGNOSIS — E119 Type 2 diabetes mellitus without complications: Secondary | ICD-10-CM

## 2018-05-01 DIAGNOSIS — Z79899 Other long term (current) drug therapy: Secondary | ICD-10-CM

## 2018-05-01 LAB — BASIC METABOLIC PANEL
Anion gap: 7 (ref 5–15)
BUN: 14 mg/dL (ref 8–23)
CALCIUM: 9.7 mg/dL (ref 8.9–10.3)
CO2: 35 mmol/L — ABNORMAL HIGH (ref 22–32)
Chloride: 99 mmol/L (ref 98–111)
Creatinine, Ser: 0.74 mg/dL (ref 0.44–1.00)
GFR calc Af Amer: >60 mL/min (ref >60)
GLUCOSE: 111 mg/dL — AB (ref 70–99)
POTASSIUM: 2.9 mmol/L — AB (ref 3.5–5.1)
SODIUM: 141 mmol/L (ref 135–145)

## 2018-05-01 LAB — CBC
HCT: 26.6 % — ABNORMAL LOW (ref 35.0–47.0)
Hemoglobin: 8.9 g/dL — ABNORMAL LOW (ref 12.0–16.0)
MCH: 27.7 pg (ref 26.0–34.0)
MCHC: 33.3 g/dL (ref 32.0–36.0)
MCV: 83 fL (ref 80.0–100.0)
PLATELETS: 144 10*3/uL — AB (ref 150–440)
RBC: 3.21 MIL/uL — AB (ref 3.80–5.20)
RDW: 16.6 % — ABNORMAL HIGH (ref 11.5–14.5)
WBC: 10.2 10*3/uL (ref 3.6–11.0)

## 2018-05-01 LAB — GLUCOSE, CAPILLARY
GLUCOSE-CAPILLARY: 107 mg/dL — AB (ref 70–99)
GLUCOSE-CAPILLARY: 208 mg/dL — AB (ref 70–99)
GLUCOSE-CAPILLARY: 119 mg/dL — AB (ref 70–99)
GLUCOSE-CAPILLARY: 238 mg/dL — AB (ref 70–99)

## 2018-05-01 LAB — MAGNESIUM: MAGNESIUM: 1.3 mg/dL — AB (ref 1.7–2.4)

## 2018-05-01 LAB — POTASSIUM: Potassium: 5.6 mmol/L — ABNORMAL HIGH (ref 3.5–5.1)

## 2018-05-01 MED ORDER — MAGNESIUM SULFATE 4 GM/100ML IV SOLN
4.0000 g | Freq: Once | INTRAVENOUS | Status: AC
  Administered 2018-05-01: 4 g via INTRAVENOUS
  Filled 2018-05-01: qty 100

## 2018-05-01 MED ORDER — SODIUM CHLORIDE 0.9 % IV SOLN: 2.0000 g | INTRAVENOUS | Status: DC

## 2018-05-01 MED ORDER — POTASSIUM CHLORIDE CRYS ER 20 MEQ PO TBCR
40.0000 meq | EXTENDED_RELEASE_TABLET | Freq: Two times a day (BID) | ORAL | Status: AC
  Administered 2018-05-01: 40 meq via ORAL
  Filled 2018-05-01: qty 2

## 2018-05-01 MED ORDER — POTASSIUM CHLORIDE CRYS ER 20 MEQ PO TBCR
40.0000 meq | EXTENDED_RELEASE_TABLET | Freq: Two times a day (BID) | ORAL | Status: DC
  Administered 2018-05-01: 40 meq via ORAL
  Filled 2018-05-01: qty 2

## 2018-05-01 MED ORDER — SODIUM CHLORIDE 0.9 % IV SOLN
2.0000 g | INTRAVENOUS | Status: AC
  Administered 2018-05-01 (×2): 2 g via INTRAVENOUS
  Filled 2018-05-01 (×2): qty 2

## 2018-05-01 MED ORDER — DEXTROSE 5 % IV SOLN
10.0000 mg/kg | Freq: Three times a day (TID) | INTRAVENOUS | Status: DC
  Filled 2018-05-01 (×4): qty 17.7

## 2018-05-01 MED ORDER — SODIUM CHLORIDE 0.9 % IV SOLN
2.0000 g | Freq: Two times a day (BID) | INTRAVENOUS | Status: AC
  Administered 2018-05-01: 2 g via INTRAVENOUS
  Filled 2018-05-01: qty 2

## 2018-05-01 MED ORDER — DEXTROSE 5 % IV SOLN
700.0000 mg | Freq: Three times a day (TID) | INTRAVENOUS | Status: DC
  Administered 2018-05-01 – 2018-05-05 (×10): 700 mg via INTRAVENOUS
  Filled 2018-05-01 (×14): qty 14

## 2018-05-01 NOTE — Progress Notes (Signed)
Prime doc page pottasium of 2.9 waiting on call back.

## 2018-05-01 NOTE — Care Management Important Message (Signed)
Important Message  Patient Details  Name: Natalie Knight MRN: [id number] Date of Birth: 1935/12/08   Medicare Important Message Given:  Yes    Olegario MessierKathy A Edgard Debord 05/01/2018, 11:52 AM

## 2018-05-01 NOTE — Progress Notes (Signed)
Nutrition Follow-up  DOCUMENTATION CODES:   Not applicable  INTERVENTION:   Continue Ensure Enlive po TID, each supplement provides 350 kcal and 20 grams of protein.  Continue daily MVI.  Add Magic cup TID with meals, each supplement provides 290 kcal and 9 grams of protein  NUTRITION DIAGNOSIS:   Inadequate oral intake related to inability to eat as evidenced by NPO status.  Inadequate intake ongoing although diet has been advanced.  GOAL:   Patient will meet greater than or equal to 90% of their needs  Progressing.  MONITOR:   PO intake, Supplement acceptance, Labs, Weight trends, Skin, I & O's  ASSESSMENT:   82 year old female with PMHx of DM, HTN who is admitted after being found unresponsive by friends, was intubated on 8/6 for airway protection, with suspected bacterial meningitis vs CVA.   Pt waxing and waning; pt with confusion requiring mittens today. Pt ate 90% of her breakfast this morning but too lethargic to have lunch. Pt's Ensure being held r/t lethargy. Pt with 53lb weight gain since admit but is now back down 10lbs. Pt noted to have hypokalemia and hypomagnesemia today. Recommend check phosphorus lab. Pt with suspected acute bacterial meningitis; waiting to have lumbar puncture after stopping eliquis.   Medications reviewed and include: lasix, insulin, Mg oxide, MVI, senokot, acyclovir, ceftriaxone   Labs reviewed: K 2.9(L), Mg 1.3(L) Hgb 8.9(L), Hct 26.6(L) cbgs- 127, 61, 101, 107, 208 x 24 hrs  Diet Order:   Diet Order            DIET DYS 3 Room service appropriate? Yes with Assist; Fluid consistency: Thin  Diet effective now             EDUCATION NEEDS:   No education needs have been identified at this time  Skin:  Skin Assessment: Reviewed RN Assessment  Last BM:  8/19- type 6  Height:   Ht Readings from Last 1 Encounters:  04/18/18 5\' 5"  (1.651 m)    Weight:   Wt Readings from Last 1 Encounters:  05/01/18 88.3 kg    Ideal  Body Weight:  56.8 kg  BMI:  Body mass index is 32.39 kg/m.  Estimated Nutritional Needs:   Kcal:  1400-1640 (MSJ x 1.2-1.4)  Protein:  80-95 grams (1.2-1.4 grams/kg)  Fluid:  1.7 L/day (25 mL/kg)  Betsey Holidayasey Dathan Attia MS, RD, LDN Pager #- [phone] Office#- [phone] After Hours Pager: [phone]

## 2018-05-01 NOTE — Progress Notes (Signed)
Spoke with Dr. Marjie SkiffSridharan potassium 2.9 md to place order.

## 2018-05-01 NOTE — Progress Notes (Signed)
I signed off on patient on Thursday but went to see how she was doing today as after normal MRI acyclovir was discontinued on Friday as per the plan She is very lethargic today and sleepy- Her nurse and tech did say she ate breakfast this morning and was conversing with her daughters- On Thursday she was working with PT trying to stand up So there is a change in her neurological status She will need LP  ( this was planned but not done due to being on Eliquis) She is  Empirically being  treated for bacterial meningitis      History  Natalie Knight is a 82 y.o. female with a history of diabetes mellitus, hypertension, vascular dementia, previous CVA recent left DVT on Eliquis was admitted to the hospital on April 18, 2018 after being found unresponsive at home.  Patient lives on her own and her daughter and daughter-in-law visits her and helps with shopping cooking etc. her daughter had seen her over the weekend of August third and fourth and patient was doing all right and was at her baseline.  On August 5 night she called her and patient was not sounding right on the phone.  She asked her what was wrong and the patient said she did not know what was wrong with her.  Told her not to worry about her.  Next morning the daughter went to her home but patient did not come to the door .  So she called her brother who had the key .  They found their mother on the bed unresponsive.  They put her on the floor and tried to resuscitate her and called 911.  When 911 reached her home patient was responding to pain and was breathing with normal blood pressure.  Sugar was more than 200.  She was brought to [facility] ED. code stroke was initiated and patient was electively intubated.  She was noted to have a temperature of 102.8 which later increased to 104.7. she was tachycardic and hypertensive with a white count of 16,000.  CT angio   was done and it was a negative study.  CT head was negative chest x-ray was normal and the  UA was noted for ketones. In the  ED she was noted to have positive meningismus or nuchal rigidity with decerebrate bilateral posturing noted and so acute sepsis was suspected along with acute bacterial meningitis.  As she was on Eliquis , lumbar puncture was not done.  She was started on vancomycin, ceftriaxone and ampicillin.  She was seen by neurologist and acyclovir was added.  She had an EEG which was abnormal with slow complex but did not show any epileptiform discharges.  Patient had another temperature of 104.7 on August 7 at 8 AM and since then she has not had abnormal temperature.  Patient remained in ICU.  Her creatinine which was normal on admission at 1.05 peaked up to 2.02 on 04/23/2018 and is back down to 1.04 today.  The WBC on admission was 16.3 and it increased up to 28.8 on 04/20/2018 and today it is 13.2.  Her hemoglobin on admission was 13.5 and it dropped to 8.8 on April 24, 2018 and then on August 13 it was 4.9 for which she received blood transfusion and today it is 8.8.  There was no cause for the acute blood loss.  She has been extubated and  has been transferred to the floor .  She is awake and knows that she is  in the hospital but is unable to give any other history.  She says her legs have been hurting her.  Meds in the hospital Acyclovir IV Ampicillin IV Ceftriaxone IV Vancomycin IV Hydralazine as needed Losartan daily NovoLog  Subjective Feeling better Sitting at the edge of bed and participating in PT. Slow to respond Some m  OBJECTIVE:BP 136/74 (BP Location: Right Arm)   Pulse 65   Temp (!) 97.5 F (36.4 C) (Axillary)   Resp 18   Ht 5\' 5"  (1.651 m)   Wt 88.3 kg   SpO2 100%   BMI 32.39 kg/m   Physical Exam Constitutional: lethargic, did not respond to calling her name until I touched her and she commented that my hands were cold   Head.  Normocephalic.  No neck rigidity. CVS: S1-S2, no murmur, edema legs RS: Bilateral air entry decreased in the  bases. GI: Soft, bowel sounds heard, no tenderness GU: Foley out Rt central line neck. MSK: She moves her limbs SKIN: No rash Neurologic: moves limbs   Lab Results CBC Latest Ref Rng & Units 05/01/2018 04/29/2018 04/28/2018  WBC 3.6 - 11.0 K/uL 10.2 - -  Hemoglobin 12.0 - 16.0 g/dL 1.6(X8.9(L) 0.9(U8.2(L) 7.6(L)  Hematocrit 35.0 - 47.0 % 26.6(L) - -  Platelets 150 - 440 K/uL 144(L) - -    BMP Latest Ref Rng & Units 05/01/2018 04/30/2018 04/29/2018  Glucose 70 - 99 mg/dL 045(W111(H) 098(J122(H) 191(Y164(H)  BUN 8 - 23 mg/dL 14 16 17   Creatinine 0.44 - 1.00 mg/dL 7.820.74 9.560.72 2.130.76  Sodium 135 - 145 mmol/L 141 137 141  Potassium 3.5 - 5.1 mmol/L 2.9(L) 3.8 3.4(L)  Chloride 98 - 111 mmol/L 99 99 103  CO2 22 - 32 mmol/L 35(H) 28 31  Calcium 8.9 - 10.3 mg/dL 9.7 9.7 9.6     Microbiology: BC 8/6 NG UC- NG       RSA PCR Screening     Status: None   Collection Time: 04/18/18  5:19 PM  Result Value Ref Range Status   MRSA by PCR NEGATIVE NEGATIVE Final    Radiographs and labs were personally reviewed by me.   Assessment and Plan 82 year-old female with a history of diabetes mellitus, hypertension, vascular dementia who lives on her own presents to the hospital after being found unresponsive by family members.  Had fever in the ED of 102.8  which increased to 104.  Patient apparently was normal and at baseline 2 days before admission and on the night before the admission when the daughter had called she was not sounding right on the phone.  Negative neuro imaging.  Lumbar puncture could not be done because of Eliquis has been treated empirically as bacterial meningitis since admission and also being treated as herpes encephalitis.  On admission her WBC count was 16 which are increased to 28.  Also creatinine which was normal on admission had increased during the hospitalization.  During the hospitalization she had an unexplained drop in hemoglobin to 4.9 and required blood transfusion.   Fluctuating  neurological status- will need LP  Acyclovir was stopped on day 12.after an  MRI with contrast that showed no temporal lobe enhancement. But there seems to be a decline in neurological status  Or atleast a fluctuating status. Will restart acyclovir and get LP Neuro was following her before- will ask them to see her  Again She will complete 2 weeks of treated for bacterial meningitis today. Will Dc ceftriaxone- continue ampicillin until after LP  for listeria  Fever with SIRS( sepsis) and unresponsiveness on admission. Being treated empirically for bacterial meningitis and herpes encephalitis.  And it is day 10 of treatment.  There is no evidence of urinary tract infection or bacteremia or pneumonia on admission.  Without a lumbar puncture and not had assessed the patient on admission unable to comment whether the patient really had bacterial meningitis or herpes encephalitis.  However Her acute presentation without any prodromal features of fever or headache in the preceding day is somewhat uncommon for  bacterial meningitis and herpes encephalitis.   With normal MRI showing no evidence of temporal lobe enhancement , EEG not showing any PLEDs herpes encephalitis is unlikely. But the MRI was done without contrast and hence is not sensitive. The fever responded pretty quickly within 24 hours which is unusual for an infection. Nevertheless this could still be an unusual presentation of a CNS infection    For bacterial meningitis the treatment for pneumococcus is 10-14 days.  ceftriaxone to be  given for another 4 days ( 8/19) Ampicillin is being given  Empirically for   listeria and is usually a 21 day course ( 05/08/18) . If MRI is normal then would discontinue on 8/19 Discontinued vancomycin as nephrotoxic (along with acyclovir)  AKI: Resolved.    Anemia -stable  Diabetes mellitus on insulin  Hypertension on losartan     Discussed the management with her hospitalist and her  nurse  Lynn ItoJAYASHREE Brandie Lopes, MD

## 2018-05-01 NOTE — Progress Notes (Signed)
Pharmacy Electrolyte Monitoring Consult:  Pharmacy consulted to assist in monitoring and replacing electrolytes in this 82 y.o. female admitted on 04/18/2018 with Altered Mental Status  Labs:  Sodium (mmol/L)  Date Value  05/01/2018 141  10/03/2014 142   Potassium (mmol/L)  Date Value  05/01/2018 5.6 (H)  10/03/2014 3.6   Magnesium (mg/dL)  Date Value  41/32/440108/19/2019 1.3 (L)   Phosphorus (mg/dL)  Date Value  02/72/536608/14/2019 3.3   Calcium (mg/dL)  Date Value  44/03/474208/19/2019 9.7   Calcium, Total (mg/dL)  Date Value  59/56/387501/21/2016 9.3   Albumin (g/dL)  Date Value  64/33/295108/08/2018 2.7 (L)  10/01/2014 2.6 (L)    Assessment/Plan: Repeat Potassium at 1800 is 5.6, up from 2.9 this morning. Patient received a total of 80mEq PO today. No additional supplementation is ordered at this time.  Will continue to follow daily labs due to possible refeeding risk.    Clovia CuffLisa Arran Fessel, PharmD, BCPS 05/01/2018 8:28 PM

## 2018-05-01 NOTE — Progress Notes (Addendum)
Pharmacy Electrolyte Monitoring Consult:  Pharmacy consulted to assist in monitoring and replacing electrolytes in this 82 y.o. female admitted on 04/18/2018 with Altered Mental Status  Labs:  Sodium (mmol/L)  Date Value  05/01/2018 141  10/03/2014 142   Potassium (mmol/L)  Date Value  05/01/2018 2.9 (L)  10/03/2014 3.6   Magnesium (mg/dL)  Date Value  09/81/191408/19/2019 1.3 (L)   Phosphorus (mg/dL)  Date Value  78/29/562108/14/2019 3.3   Calcium (mg/dL)  Date Value  30/86/578408/19/2019 9.7   Calcium, Total (mg/dL)  Date Value  69/62/952801/21/2016 9.3   Albumin (g/dL)  Date Value  41/32/440108/08/2018 2.7 (L)  10/01/2014 2.6 (L)    Assessment/Plan: Continue Mag-Ox 400 mg PO BID. Ordered Mg sulfate 4 IV x 1. KCl 40 mEq PO x 2 ordered. Will recheck Potassium at 1800.  Will continue to follow daily labs due to possible refeeding risk.    Mauri ReadingSavanna M Haston Casebolt, PharmD Pharmacy Resident  05/01/2018 7:38 AM

## 2018-05-01 NOTE — Progress Notes (Addendum)
Clinical Child psychotherapistocial Worker (CSW) sent most recent therapy notes to [name] liaison. Per [name] she is re-starting [facility] authorization today. Per [name] [facility] requested a PT note from today. CSW sent today's PT note to [facility].    Baker Hughes IncorporatedBailey Anjalina Bergevin, LCSW [phone]

## 2018-05-01 NOTE — Progress Notes (Addendum)
[facility] - [place] [facility]   PATIENT NAME: Natalie Knight    MR#:  [id number]  DATE OF BIRTH:  1935-11-16  SUBJECTIVE:  Confused this morning. Unable to answer questions. Per daughter, she was more alert this morning and then became confused after her blood draw.  REVIEW OF SYSTEMS:  Review of Systems  Unable to perform ROS: Mental status change   DRUG ALLERGIES:  No Known Allergies VITALS:  Blood pressure (!) 152/74, pulse 63, temperature (!) 97.5 F (36.4 C), temperature source Axillary, resp. rate 19, height 5\' 5"  (1.651 m), weight 88.3 kg, SpO2 95 %. PHYSICAL EXAMINATION:  Physical Exam  HENT:  Head: Normocephalic and atraumatic.  Eyes: Pupils are equal, round, and reactive to light. EOM are normal. No scleral icterus.  Neck: Normal range of motion. Neck supple. No JVD present. No tracheal deviation present.  Cardiovascular: Normal rate, regular rhythm and normal heart sounds. Exam reveals no gallop.  No murmur heard. Pulmonary/Chest: Breath sounds normal. No respiratory distress. She has no wheezes. She has no rales.  Abdominal: Soft. Bowel sounds are normal. She exhibits no distension. There is no tenderness. There is no rebound.  Musculoskeletal: She exhibits no edema or tenderness.  Neurological: She is alert.  Confused. Does not answer questions. Occasionally follows commands.  Skin: Skin is warm and dry. No rash noted. No erythema.  Psychiatric:  Unable to assess   LABORATORY PANEL:  Female CBC Recent Labs  Lab 05/01/18 0448  WBC 10.2  HGB 8.9*  HCT 26.6*  PLT 144*   ------------------------------------------------------------------------------------------------------------------ Chemistries  Recent Labs  Lab 05/01/18 0447  NA 141  K 2.9*  CL 99  CO2 35*  GLUCOSE 111*  BUN 14  CREATININE 0.74  CALCIUM 9.7  MG 1.3*   RADIOLOGY:  No results found. ASSESSMENT AND PLAN:   Acute toxic and metabolic encephalopathy-  remains confused this morning. Seems to be waxing and waning. Likely due to septic shock and suspected acute bacterial meningitis. - continue to monitor - discussed with neuro and ID today- will order LP now that patient has been off eliquis for 6 days, in order to rule out herpes encephalitis (has been off acyclovir for 48 hours). Restart acyclovir after LP.  Acute respiratory failure with hypoxia- resolved, now on room air. - vitals per unit routine  Septic shock - resolved, due to suspected acute bacterial meningitis. Unable to perform LP due to eliquis. Blood and urine cultures negative. - last doses of ampicillin/rocephin today (total 14 day course per ID recs) - acyclovir stopped 8/16 due to negative MRI brain, will restart after LP  AKI due to ATN from hypotension- resolved - monitor  Elevated troponin- likely due to demand ischemia - monitor  Chronic diabetes mellitus type 2- blood sugars have been well-controlled - continue SSI  History of DVT- stable, no signs of acute DVT on exam. SCDs in place. - holding eliquis due to anemia  Anemia of chronic disease and acute blood loss. S/p pRBC transfusion. Hgb has been stable since then. FOBT positive this admission. - will need to f/u with GI on discharge  Accelerated hypertension- improving. Has been mostly normotensive. - continue HCTZ, losartan, and metoprolol tartrate - hydralazine IV prn  Hypokalemia/hypomagnesemia - repleted - recheck tomorrow  All the records are reviewed and case discussed with Care Management/Social Worker. Management plans discussed with the patient's daughter, and they are in agreement.  CODE STATUS: DNR  TOTAL TIME TAKING CARE OF THIS PATIENT:  35 minutes.   POSSIBLE DISCHARGE TO SNF TOMORROW, PENDING IMPROVEMENT IN MENTAL STATUS.  Jinny BlossomKaty D Mayo M.D on 05/01/2018 at 1:07 PM  Between 7am to 6pm - Pager - [phone]  After 6pm go to [email or web address] - Hotel managerpassword [email or web address]  [facility]  [place] [facility]

## 2018-05-01 NOTE — Progress Notes (Signed)
Physical Therapy Treatment Patient Details Name: Natalie Knight MRN: [id number] DOB: [birth year] Today's Date: 05/01/2018    History of Present Illness Pt is a81 y.o.femalewith a known history that includes DM, HTN, anemia chronic disease, dementia, gout, and DVT on Eliquis presented from her apartment, found unresponsive in the bed, patient nonverbal/not following commands/no eye-opening, family member started CPR, EMS brought to the emergency room, code stroke initiated, patient noted to be comatose, patient electively intubated, patient noted to be tachycardic, hypertensive, potassium 3.3, white count 16,000, CT angios was a negative study, CT head negative, chest x-ray negative, urinalysis noted for acute ketones/dehydration.  Assessment includes: septic shock suspected due to acute bacterial meningitis, leukocytosis, acute toxic and metabolic encephalopathy, AKI, elevated troponin possibly due to demand ischemia, HTN, and anemia of chronic disease and acute blood loss of unclear etiology.    PT Comments    Spoke with nursing prior to attempt due to low K level this morn (2.9) with supplementation started for permission to treat. Nursing agreed. Pt confused/agitated. Does respond inconsistently to name, but unable to verbalize any comprehensible answers to questions. Pt does moan when asked about pain; unable to communicate or gesture as to where. Daughter in room and attempted with PT to assist pt in following instruction/respond to treatment; pt ultimately unable this session. Max A for bed mobility to reposition pt comfortably. Daughter has questions regarding discharge; educated on medical stability being focus prior to discharge. MD in during session and in agreement. Continue PT to encourage participation and progress endurance/strength, as pt more aware and able to participate to improve functional mobility.   Follow Up Recommendations  SNF     Equipment Recommendations  None  recommended by PT    Recommendations for Other Services       Precautions / Restrictions Precautions Precautions: Fall Restrictions Weight Bearing Restrictions: No    Mobility  Bed Mobility Overal bed mobility: Needs Assistance             General bed mobility comments: Repositioning pt for comfort/attempt to decrease restlessness. Pt tends to lean/push to R with head of bed elevated. Max A to adjust trunk   Transfers                 General transfer comment: unsafe to attempt  Ambulation/Gait                 Stairs             Wheelchair Mobility    Modified Rankin (Stroke Patients Only)       Balance                                            Cognition Arousal/Alertness: Lethargic Behavior During Therapy: Restless;Agitated Overall Cognitive Status: Impaired/Different from baseline Area of Impairment: Orientation;Attention;Memory;Following commands;Awareness;Safety/judgement;Problem solving                 Orientation Level: Disoriented to;Place;Time;Situation Current Attention Level: (unable) Memory: Decreased short-term memory Following Commands: (uanble to follow commands) Safety/Judgement: Decreased awareness of safety;Decreased awareness of deficits     General Comments: Pt unable to maintain focus when attempting to interact with pt. Pt very restless/mildly agitated (mitts in place). Pt occasionally directs faces toward therapist when using pt name; unable to demonstrate eye contact or verbalize any responses. Unable to follow any instruction for LE/trunk movement.  Exercises Other Exercises Other Exercises: LE aarom attempted; pt unable to follow any instruction    General Comments        Pertinent Vitals/Pain Pain Assessment: Faces Faces Pain Scale: Hurts little more Pain Location: (unable to determine, pt moans/restless; unable to verbalize)    Home Living                       Prior Function            PT Goals (current goals can now be found in the care plan section) Progress towards PT goals: Not progressing toward goals - comment    Frequency    Min 2X/week      PT Plan Current plan remains appropriate    Co-evaluation              AM-PAC PT "6 Clicks" Daily Activity  Outcome Measure  Difficulty turning over in bed (including adjusting bedclothes, sheets and blankets)?: Unable Difficulty moving from lying on back to sitting on the side of the bed? : Unable Difficulty sitting down on and standing up from a chair with arms (e.g., wheelchair, bedside commode, etc,.)?: Unable Help needed moving to and from a bed to chair (including a wheelchair)?: Total Help needed walking in hospital room?: Total Help needed climbing 3-5 steps with a railing? : Total 6 Click Score: 6    End of Session   Activity Tolerance: Treatment limited secondary to agitation;Other (comment)(Inability to follow instruction) Patient left: in bed;with call bell/phone within reach;with bed alarm set;with family/visitor present;Other (comment)(Mitts in place) Nurse Communication: Other (comment)(permission to treat due to low K (supplement started)) PT Visit Diagnosis: Muscle weakness (generalized) (M62.81);Difficulty in walking, not elsewhere classified (R26.2)     Time: [id number] PT Time Calculation (min) (ACUTE ONLY): 14 min  Charges:  $Therapeutic Activity: 8-22 mins                      Scot DockHeidi E Caralina Nop, PTA 05/01/2018, 10:26 AM

## 2018-05-01 NOTE — Progress Notes (Signed)
Pharmacy Antibiotic Note  Natalie Knight is a 82 y.o. female admitted on 04/18/2018 with meningitis.  Pharmacy has been consulted for vancomycin, ceftriaxone, acyclovir, and ampicillin dosing. She has a known history which includes anemia chronic disease, dementia, gout, DVT on Eliquis presenting from her apartment, found unresponsive in the bed, patient nonverbal/not following commands/no eye-opening, family member started CPR, EMS brought to the emergency room, patient noted to be comatose then intubated.   Plan: Vancomycin DCed per ID on 8/15   Acyclovir D/Ced based off ID recommendations/MRI of head resulted no indication of HV encephalitis.  Continue Ampicillin 2g IV Q4hr.   Continue Ceftriaxone 2g IV Q12hr.   ~2 doses left of Ampicillin and 1 dose of Ceftriaxone to complete full 14 day regimen.     Height: 5\' 5"  (165.1 cm) Weight: 194 lb 10.7 oz (88.3 kg) IBW/kg (Calculated) : 57  Temp (24hrs), Avg:97.6 F (36.4 C), Min:97.5 F (36.4 C), Max:97.8 F (36.6 C)  Recent Labs  Lab 04/25/18 0454 04/26/18 0509 04/26/18 1101 04/27/18 0436 04/28/18 0732 04/29/18 0513 04/30/18 0304 05/01/18 0447  WBC 8.9 13.2*  --  13.1*  --   --   --   --   CREATININE 1.43* 1.04*  --  0.90 0.82 0.76 0.72 0.74  VANCOTROUGH  --   --  17  --   --   --   --   --     No Known Allergies  Antimicrobials this admission: Cefepime 8/6 x1  Ampicillin 8/6 >>  Vancomycin 8/6 >> 0815 Ceftriaxone 8/6>> Acyclovir 8/6 >>0816  Microbiology results: 8/6 BCx: NG  8/6 UCx: NG  8/6 MRSA PCR: negative   Thank you for allowing pharmacy to be a part of this patient's care.  Mauri ReadingSavanna M Martin, PharmD Pharmacy Resident  05/01/2018 7:46 AM

## 2018-05-01 NOTE — Consult Note (Addendum)
Pharmacy Antibiotic Note  Natalie Knight is a 82 y.o. female admitted on 04/18/2018 with meningitis.  Pharmacy has been consulted for Acyclovir dosing.  Plan: Restarting Acyclovir 700 mg q8h for worsened mental status. Using adjusted body weight of 59.5 kg. Starting prior to LP after discussion with Dr. Elvera Lennox and Dr. Nancy MarusMayo.  Height: 5\' 5"  (165.1 cm) Weight: 194 lb 10.7 oz (88.3 kg) IBW/kg (Calculated) : 57  Temp (24hrs), Avg:97.8 F (36.6 C), Min:97.5 F (36.4 C), Max:98.5 F (36.9 C)  Recent Labs  Lab 04/25/18 0454 04/26/18 0509 04/26/18 1101 04/27/18 0436 04/28/18 0732 04/29/18 0513 04/30/18 0304 05/01/18 0447 05/01/18 0448  WBC 8.9 13.2*  --  13.1*  --   --   --   --  10.2  CREATININE 1.43* 1.04*  --  0.90 0.82 0.76 0.72 0.74  --   VANCOTROUGH  --   --  17  --   --   --   --   --   --     Estimated Creatinine Clearance: 62.6 mL/min (by C-G formula based on SCr of 0.74 mg/dL).    No Known Allergies  Antimicrobials this admission: Ceftriaxone 0806 >> 0819 Ampicillin 0806 >> 0819 Acyclovir 0806>> 0815 --- 0819>> Vancomycin 4782>>[id number]>>0815   Microbiology results: 8/6 BCx: NG  8/6 UCx: NG  8/6 MRSA PCR: negative   Thank you for allowing pharmacy to be a part of this patient's care.   Mauri ReadingSavanna M Martin, PharmD Pharmacy Resident  05/01/2018 4:18 PM

## 2018-05-01 NOTE — Progress Notes (Signed)
Pt is alert but remains confused this shift. Mittens in place. Bath given this morning. Pt able to sleep in between care.

## 2018-05-02 ENCOUNTER — Inpatient Hospital Stay: Payer: Medicare HMO

## 2018-05-02 DIAGNOSIS — R410 Disorientation, unspecified: Secondary | ICD-10-CM

## 2018-05-02 LAB — CSF CELL COUNT WITH DIFFERENTIAL
EOS CSF: 0 %
Eosinophils, CSF: 0 %
LYMPHS CSF: 62 %
LYMPHS CSF: 10 %
MONOCYTE-MACROPHAGE-SPINAL FLUID: 0 %
MONOCYTE-MACROPHAGE-SPINAL FLUID: 3 %
RBC Count, CSF: 134 /mm3 — ABNORMAL HIGH (ref 0–3)
RBC Count, CSF: 2224 /mm3 — ABNORMAL HIGH (ref 0–3)
SEGMENTED NEUTROPHILS-CSF: 38 %
Segmented Neutrophils-CSF: 87 %
TUBE #: 3
TUBE #: 1
WBC, CSF: 2 /mm3 (ref 0–5)
WBC, CSF: 4 /mm3 (ref 0–5)

## 2018-05-02 LAB — BASIC METABOLIC PANEL
ANION GAP: 8 (ref 5–15)
BUN: 22 mg/dL (ref 8–23)
CALCIUM: 9.5 mg/dL (ref 8.9–10.3)
CO2: 32 mmol/L (ref 22–32)
Chloride: 97 mmol/L — ABNORMAL LOW (ref 98–111)
Creatinine, Ser: 0.82 mg/dL (ref 0.44–1.00)
GFR calc non Af Amer: >60 mL/min (ref >60)
GLUCOSE: 209 mg/dL — AB (ref 70–99)
POTASSIUM: 3.3 mmol/L — AB (ref 3.5–5.1)
Sodium: 137 mmol/L (ref 135–145)

## 2018-05-02 LAB — GLUCOSE, CAPILLARY
GLUCOSE-CAPILLARY: 184 mg/dL — AB (ref 70–99)
Glucose-Capillary: 166 mg/dL — ABNORMAL HIGH (ref 70–99)
Glucose-Capillary: 197 mg/dL — ABNORMAL HIGH (ref 70–99)
Glucose-Capillary: 156 mg/dL — ABNORMAL HIGH (ref 70–99)
Glucose-Capillary: 147 mg/dL — ABNORMAL HIGH (ref 70–99)

## 2018-05-02 LAB — CRYPTOCOCCAL ANTIGEN, CSF: CRYPTO AG: NEGATIVE

## 2018-05-02 LAB — CBC
HCT: 26 % — ABNORMAL LOW (ref 35.0–47.0)
HEMOGLOBIN: 8.5 g/dL — AB (ref 12.0–16.0)
MCH: 27.3 pg (ref 26.0–34.0)
MCHC: 32.8 g/dL (ref 32.0–36.0)
MCV: 83.4 fL (ref 80.0–100.0)
PLATELETS: 146 10*3/uL — AB (ref 150–440)
RBC: 3.12 MIL/uL — AB (ref 3.80–5.20)
RDW: 18.5 % — ABNORMAL HIGH (ref 11.5–14.5)
WBC: 8.4 10*3/uL (ref 3.6–11.0)

## 2018-05-02 LAB — PHOSPHORUS: Phosphorus: 3.2 mg/dL (ref 2.5–4.6)

## 2018-05-02 LAB — GLUCOSE, CSF: Glucose, CSF: 99 mg/dL — ABNORMAL HIGH (ref 40–70)

## 2018-05-02 LAB — MAGNESIUM: Magnesium: 1.7 mg/dL (ref 1.7–2.4)

## 2018-05-02 LAB — PROTEIN, CSF: TOTAL PROTEIN, CSF: 86 mg/dL — AB (ref 15–45)

## 2018-05-02 MED ORDER — MAGNESIUM SULFATE 2 GM/50ML IV SOLN
2.0000 g | Freq: Once | INTRAVENOUS | Status: DC
  Filled 2018-05-02: qty 50

## 2018-05-02 MED ORDER — POTASSIUM CHLORIDE CRYS ER 20 MEQ PO TBCR
40.0000 meq | EXTENDED_RELEASE_TABLET | Freq: Two times a day (BID) | ORAL | Status: DC
  Administered 2018-05-03 – 2018-05-05 (×4): 40 meq via ORAL
  Filled 2018-05-02 (×4): qty 2

## 2018-05-02 MED ORDER — POTASSIUM CHLORIDE CRYS ER 20 MEQ PO TBCR
40.0000 meq | EXTENDED_RELEASE_TABLET | Freq: Once | ORAL | Status: AC
  Administered 2018-05-02: 40 meq via ORAL
  Filled 2018-05-02: qty 2

## 2018-05-02 MED ORDER — SODIUM CHLORIDE 0.9 % IV SOLN
2.0000 g | INTRAVENOUS | Status: DC
  Administered 2018-05-02 – 2018-05-04 (×10): 2 g via INTRAVENOUS
  Filled 2018-05-02: qty 2
  Filled 2018-05-02: qty 2000
  Filled 2018-05-02: qty 2
  Filled 2018-05-02: qty 2000
  Filled 2018-05-02 (×3): qty 2
  Filled 2018-05-02 (×4): qty 2000
  Filled 2018-05-02 (×3): qty 2
  Filled 2018-05-02: qty 2000
  Filled 2018-05-02: qty 2
  Filled 2018-05-02: qty 2000

## 2018-05-02 NOTE — Progress Notes (Signed)
Physical Therapy Treatment Patient Details Name: Natalie Knight MRN: [id number] DOB: 1936-01-24 Today's Date: 05/02/2018    History of Present Illness Pt is a81 y.o.femalewith a known history that includes DM, HTN, anemia chronic disease, dementia, gout, and DVT on Eliquis presented from her apartment, found unresponsive in the bed, patient nonverbal/not following commands/no eye-opening, family member started CPR, EMS brought to the emergency room, code stroke initiated, patient noted to be comatose, patient electively intubated, patient noted to be tachycardic, hypertensive, potassium 3.3, white count 16,000, CT angios was a negative study, CT head negative, chest x-ray negative, urinalysis noted for acute ketones/dehydration.  Assessment includes: septic shock suspected due to acute bacterial meningitis, leukocytosis, acute toxic and metabolic encephalopathy, AKI, elevated troponin possibly due to demand ischemia, HTN, and anemia of chronic disease and acute blood loss of unclear etiology.    PT Comments    Pt recently returned from procedure this am.  Pt remains lethargic and did not participate in LE PROM despite encouragement.  Mobility held this session as she was not assisting for pt and staff safety.      Follow Up Recommendations  SNF     Equipment Recommendations  None recommended by PT    Recommendations for Other Services       Precautions / Restrictions Precautions Precautions: Fall Restrictions Weight Bearing Restrictions: No    Mobility  Bed Mobility               General bed mobility comments: deferred  Transfers                 General transfer comment: unsafe to attempt  Ambulation/Gait                 Stairs             Wheelchair Mobility    Modified Rankin (Stroke Patients Only)       Balance       Sitting balance - Comments: deferred                                    Cognition  Arousal/Alertness: Lethargic Behavior During Therapy: Restless;Agitated Overall Cognitive Status: Impaired/Different from baseline                                        Exercises Other Exercises Other Exercises: BLE PROM for ankle pumps, heel slides, ab/add, SLR x 10 - did not assist    General Comments        Pertinent Vitals/Pain Faces Pain Scale: Hurts little more Pain Location: BLE movement Pain Descriptors / Indicators: Grimacing Pain Intervention(s): Limited activity within patient's tolerance    Home Living                      Prior Function            PT Goals (current goals can now be found in the care plan section) Progress towards PT goals: Not progressing toward goals - comment    Frequency    Min 2X/week      PT Plan Current plan remains appropriate    Co-evaluation              AM-PAC PT "6 Clicks" Daily Activity  Outcome Measure  Difficulty turning over in bed (including  adjusting bedclothes, sheets and blankets)?: Unable Difficulty moving from lying on back to sitting on the side of the bed? : Unable Difficulty sitting down on and standing up from a chair with arms (e.g., wheelchair, bedside commode, etc,.)?: Unable Help needed moving to and from a bed to chair (including a wheelchair)?: Total Help needed walking in hospital room?: Total Help needed climbing 3-5 steps with a railing? : Total 6 Click Score: 6    End of Session Equipment Utilized During Treatment: Gait belt Activity Tolerance: Patient limited by lethargy;Treatment limited secondary to agitation Patient left: in bed;with bed alarm set;with call bell/phone within reach;with family/visitor present         Time: [id number] PT Time Calculation (min) (ACUTE ONLY): 8 min  Charges:  $Therapeutic Exercise: 8-22 mins                     Danielle DessSarah Hady Niemczyk, PTA 05/02/18, 10:57 AM

## 2018-05-02 NOTE — Progress Notes (Signed)
Inpatient Diabetes Program Recommendations  AACE/ADA: New Consensus Statement on Inpatient Glycemic Control (2015)  Target Ranges:  Prepandial:   less than 140 mg/dL      [facility] postprandial:   less than 180 mg/dL (1-2 hours)      Critically ill patients:  140 - 180 mg/dL  Results for Natalie Knight, Natalie Knight (MRN [id number]) as of 05/02/2018 09:47  Ref. Range 05/01/2018 07:32 05/01/2018 12:00 05/01/2018 16:42 05/01/2018 21:14 05/02/2018 07:44  Glucose-Capillary Latest Ref Range: 70 - 99 mg/dL 829107 (H) 562208 (H)  Novolog 8 units  Lantus 5 units 119 (H) 238 (H)  Novolog 2 units 166 (H)    Review of Glycemic Control  Current orders for Inpatient glycemic control: Novolog 0-15 units TID with meals, Novolog 0-5 units QHS  Inpatient Diabetes Program Recommendations:  Insulin - Basal: Patient received Lantus 5 units on 05/01/18 and fasting glucose 166 mg/dl. Noted Lantus was discontinued yesterday afternoon. If glucose becomes consistently greater than 180 mg/dl, please consider reordering Lantus 5 units daily. Insulin-Meal Coverage: Noted Novolog 3 units TID with meals for meal coverage was discontinued on 05/01/18.  Thanks, Orlando PennerMarie Aydden Cumpian, RN, MSN, CDE Diabetes Coordinator Inpatient Diabetes Program [phone] (Team Pager from 8am to 5pm)

## 2018-05-02 NOTE — Consult Note (Addendum)
Pharmacy Antibiotic Note  Natalie Knight is a 82 y.o. female admitted on 04/18/2018 with meningitis.  Pharmacy has been consulted for Acyclovir dosing.  Plan: D2 Acyclovir Continue Acyclovir 700 mg q8h for worsened mental status. Used adjusted body weight of 69.1 kg.   Will continue to monitor.  Height: 5\' 5"  (165.1 cm) Weight: 192 lb 7.4 oz (87.3 kg) IBW/kg (Calculated) : 57  Temp (24hrs), Avg:98 F (36.7 C), Min:97.5 F (36.4 C), Max:98.7 F (37.1 C)  Recent Labs  Lab 04/26/18 0509 04/26/18 1101 04/27/18 0436 04/28/18 0732 04/29/18 0513 04/30/18 0304 05/01/18 0447 05/01/18 0448 05/02/18 0500  WBC 13.2*  --  13.1*  --   --   --   --  10.2 8.4  CREATININE 1.04*  --  0.90 0.82 0.76 0.72 0.74  --  0.82  VANCOTROUGH  --  17  --   --   --   --   --   --   --     Estimated Creatinine Clearance: 60.7 mL/min (by C-G formula based on SCr of 0.82 mg/dL).    No Known Allergies  Antimicrobials this admission: Ceftriaxone 0806 >> 0819 Ampicillin 0806 >> 0819>>0820 (per ID) Acyclovir 0806>> 0816 --- 0819>> Vancomycin 4098>>[id number]>>0815   Microbiology results: 8/6 BCx: NG  8/6 UCx: NG  8/6 MRSA PCR: negative   Thank you for allowing pharmacy to be a part of this patient's care.   Mauri ReadingSavanna M Mozell Hardacre, PharmD Pharmacy Resident  05/02/2018 7:24 AM

## 2018-05-02 NOTE — Progress Notes (Signed)
Pt had LP today She was sleeping and did not respond but when the NT woke her up she answered questions appropriately but very slowly. She ate a good breakfast this morning  OBJECTIVE:BP 111/89 (BP Location: Right Arm)   Pulse 65   Temp (!) 97.4 F (36.3 C) (Oral)   Resp 16   Ht 5\' 5"  (1.651 m)   Wt 87.3 kg   SpO2 100%   BMI 32.03 kg/m   Physical Exam Constitutional: lethargic, did not respond to calling her name until I touched her and she commented that my hands were cold   Head.  Normocephalic.  No neck rigidity. CVS: S1-S2, no murmur, edema legs RS: Bilateral air entry decreased in the bases. GI: Soft, bowel sounds heard, no tenderness GU: Foley out Rt central line neck. MSK: She moves her limbs SKIN: No rash Neurologic: moves limbs   Lab Results CBC Latest Ref Rng & Units 05/02/2018 05/01/2018 04/29/2018  WBC 3.6 - 11.0 K/uL 8.4 10.2 -  Hemoglobin 12.0 - 16.0 g/dL 1.6(X8.5(L) 8.9(L) 8.2(L)  Hematocrit 35.0 - 47.0 % 26.0(L) 26.6(L) -  Platelets 150 - 440 K/uL 146(L) 144(L) -    BMP Latest Ref Rng & Units 05/02/2018 05/01/2018 05/01/2018  Glucose 70 - 99 mg/dL 096(E209(H) - 454(U111(H)  BUN 8 - 23 mg/dL 22 - 14  Creatinine 9.810.44 - 1.00 mg/dL 1.910.82 - 4.780.74  Sodium 295135 - 145 mmol/L 137 - 141  Potassium 3.5 - 5.1 mmol/L 3.3(L) 5.6(H) 2.9(L)  Chloride 98 - 111 mmol/L 97(L) - 99  CO2 22 - 32 mmol/L 32 - 35(H)  Calcium 8.9 - 10.3 mg/dL 9.5 - 9.7     Microbiology: BC 8/6 NG UC- NG  LP Wbc 2 ( they have given a differential on this - .   Assessment and Plan 82 year-old female with a history of diabetes mellitus, hypertension, vascular dementia who lives on her own presents to the hospital after being found unresponsive by family members.  Had fever in the ED of 102.8  which increased to 104.  Patient apparently was normal and at baseline 2 days before admission and on the night before the admission when the daughter had called she was not sounding right on the phone.  Negative  neuro imaging.  Lumbar puncture could not be done because of Eliquis has been treated empirically as bacterial meningitis since admission and also being treated as herpes encephalitis.  On admission her WBC count was 16 which are increased to 28.  Also creatinine which was normal on admission had increased during the hospitalization.  During the hospitalization she had an unexplained drop in hemoglobin to 4.9 and required blood transfusion.   Fluctuating neurological status-  LP done today- traumatic tap elevated protein, elevated glucose Wbc 2 ( 0-5 N) 62% Lymph Awaiting HSV DNA This csf is not totally normal but currently is not indicative of bacterial or viral meningitis because of normal total WBC. But she has been on antibiotics for 16 days So will continue acyclovir until HSV DNA is back- will also continue ampicillin for empiric listeria treatment- She has completed pneumococcal meningitis treatment  Fever with SIRS( sepsis) and unresponsiveness on admission. Being treated empirically for bacterial meningitis and herpes encephalitis.  And it is day 10 of treatment.  There is no evidence of urinary tract infection or bacteremia or pneumonia on admission.  Without a lumbar puncture and not had assessed the patient on admission unable to comment whether the patient really had bacterial  meningitis or herpes encephalitis.  However Her acute presentation without any prodromal features of fever or headache in the preceding day is somewhat uncommon for  bacterial meningitis and herpes encephalitis.   With normal MRI showing no evidence of temporal lobe enhancement , EEG not showing any PLEDs herpes encephalitis is unlikely. But the MRI was done without contrast and hence is not sensitive. The fever responded pretty quickly within 24 hours which is unusual for an infection. Nevertheless this could still be an unusual presentation of a CNS infection     AKI: Resolved.    Anemia  -stable  Diabetes mellitus on insulin  Hypertension on losartan     Discussed the management with her nurse Lynn ItoJAYASHREE Jaan Fischel, MD

## 2018-05-02 NOTE — Progress Notes (Signed)
Ok to hold am medication per MD conversation until more alert and bed rest complete post procedure.

## 2018-05-02 NOTE — Progress Notes (Signed)
Pt stable after lp.Back stable.F/U with her MD. 

## 2018-05-02 NOTE — Progress Notes (Addendum)
Subjective: Patient is drowsy and opens eyes only to verbal and tactile stimuli. Unable to answer questions.   Objective: Current vital signs: BP 129/69 (BP Location: Right Arm)   Pulse 68   Temp 97.9 F (36.6 C) (Oral)   Resp 16   Ht 5\' 5"  (1.651 m)   Wt 87.3 kg   SpO2 99%   BMI 32.03 kg/m  Vital signs in last 24 hours: Temp:  [97.5 F (36.4 C)-98.7 F (37.1 C)] 97.9 F (36.6 C) (08/20 0722) Pulse Rate:  [65-68] 68 (08/20 0722) Resp:  [16-19] 16 (08/20 0722) BP: (115-136)/(52-74) 129/69 (08/20 0722) SpO2:  [99 %-100 %] 99 % (08/20 0722) Weight:  [87.3 kg] 87.3 kg (08/20 0434)  Intake/Output from previous day: 08/19 0701 - 08/20 0700 In: 1102.3 [P.O.:360; I.V.:70; IV Piggyback:672.3] Out: 1450 [Urine:1450] Intake/Output this shift: Total I/O In: 120 [P.O.:120] Out: -  Nutritional status:  Diet Order            DIET DYS 3 Room service appropriate? Yes with Assist; Fluid consistency: Thin  Diet effective now              Physical Exam   Vitals Blood pressure 129/69, pulse 68, temperature 97.9 F (36.6 C), temperature source Oral, resp. rate 16, height 5\' 5"  (1.651 m), weight 87.3 kg, SpO2 99 %.   Neurological Exam . Drowsy and awakens only to tactile stimuli . Unable to perform orientation questions, not awake and unable to follow commands. . I. Olfactory not examined . II: Visual fields were full. Pupils were equal, round and reactive to light and accommodation . III,IV, VI: ptosis not present, extra-ocular motions intact bilaterally . V,VII: Deffered . VIII: Deffered . IX, X: Deffered . XI: Deffered UTA . XII: Deffered . Tone is normal in all extremities, no abnormal movements seen  . Muscle strength not tested . Deep tendon reflexes were symmetric  . Unable to assess sensation . Gait and station not tested  Lab Results: Basic Metabolic Panel: Recent Labs  Lab 04/26/18 0509  04/28/18 0732 04/29/18 0513 04/30/18 0304 05/01/18 0447  05/01/18 1847 05/02/18 0500  NA 145   < > 143 141 137 141  --  137  K 3.7   < > 3.5 3.4* 3.8 2.9* 5.6* 3.3*  CL 114*   < > 109 103 99 99  --  97*  CO2 23   < > 27 31 28  35*  --  32  GLUCOSE 212*   < > 197* 164* 122* 111*  --  209*  BUN 32*   < > 21 17 16 14   --  22  CREATININE 1.04*   < > 0.82 0.76 0.72 0.74  --  0.82  CALCIUM 9.0   < > 9.3 9.6 9.7 9.7  --  9.5  MG 1.7   < > 1.5* 1.5* 1.8 1.3*  --  1.7  PHOS 3.3  --   --   --   --   --   --  3.2   < > = values in this interval not displayed.    Liver Function Tests: No results for input(s): AST, ALT, ALKPHOS, BILITOT, PROT, ALBUMIN in the last 168 hours. No results for input(s): LIPASE, AMYLASE in the last 168 hours. No results for input(s): AMMONIA in the last 168 hours.  CBC: Recent Labs  Lab 04/26/18 0509 04/27/18 0436 04/28/18 0732 04/29/18 0513 05/01/18 0448 05/02/18 0500  WBC 13.2* 13.1*  --   --  10.2 8.4  NEUTROABS  --  11.1*  --   --   --   --   HGB 8.8* 8.1* 7.6* 8.2* 8.9* 8.5*  HCT 25.5* 23.3*  --   --  26.6* 26.0*  MCV 80.7 79.8*  --   --  83.0 83.4  PLT 143* 151  --   --  144* 146*    Cardiac Enzymes: No results for input(s): CKTOTAL, CKMB, CKMBINDEX, TROPONINI in the last 168 hours.  Lipid Panel: No results for input(s): CHOL, TRIG, HDL, CHOLHDL, VLDL, LDLCALC in the last 168 hours.  CBG: Recent Labs  Lab 05/01/18 1642 05/01/18 2114 05/02/18 0744 05/02/18 1202 05/02/18 1348  GLUCAP 119* 238* 166* 184* 197*    Microbiology: Results for orders placed or performed during the hospital encounter of 04/18/18  Urine culture     Status: None   Collection Time: 04/18/18 12:08 PM  Result Value Ref Range Status   Specimen Description   Final    URINE, RANDOM Performed at [facility], [address] [place], [place] [address]    Special Requests   Final    NONE Performed at [facility], [address] [place], [place] [address]    Culture   Final    NO GROWTH Performed at  [facility], [address], [place], [place] [address]    Report Status 04/19/2018 FINAL  Final  Blood Culture (routine x 2)     Status: None   Collection Time: 04/18/18  1:12 PM  Result Value Ref Range Status   Specimen Description BLOOD RIGHT HAND  Final   Special Requests   Final    BOTTLES DRAWN AEROBIC AND ANAEROBIC Blood Culture results may not be optimal due to an inadequate volume of blood received in culture bottles   Culture   Final    NO GROWTH 5 DAYS Performed at [facility], [address] [place], [place] [address]    Report Status 04/23/2018 FINAL  Final  Blood Culture (routine x 2)     Status: None   Collection Time: 04/18/18  1:20 PM  Result Value Ref Range Status   Specimen Description BLOOD RIGHT [facility]  Final   Special Requests   Final    BOTTLES DRAWN AEROBIC AND ANAEROBIC Blood Culture adequate volume   Culture   Final    NO GROWTH 5 DAYS Performed at [facility], [address] [place], [place] [address]    Report Status 04/23/2018 FINAL  Final  MRSA PCR Screening     Status: None   Collection Time: 04/18/18  5:19 PM  Result Value Ref Range Status   MRSA by PCR NEGATIVE NEGATIVE Final    Comment:        The GeneXpert MRSA Assay (FDA approved for NASAL specimens only), is one component of a comprehensive MRSA colonization surveillance program. It is not intended to diagnose MRSA infection nor to guide or monitor treatment for MRSA infections. Performed at [facility], [address] [place], [place] [address]   CSF culture     Status: None (Preliminary result)   Collection Time: 05/02/18 10:00 AM  Result Value Ref Range Status   Specimen Description CSF  Final   Special Requests CSF  Final   Gram Stain   Final    NO ORGANISMS SEEN FEW RED BLOOD CELLS NO WBC SEEN Performed at [facility], [address] [place], [place] [address]  Culture PENDING  Incomplete   Report Status PENDING   Incomplete    Coagulation Studies: No results for input(s): LABPROT, INR in the last 72 hours.  Imaging: Dg Fluoro Guided Loc Of Needle/cath Tip For Spinal Inject Lt  Result Date: 05/02/2018 CLINICAL DATA:  Altered mental status. EXAM: DIAGNOSTIC LUMBAR PUNCTURE UNDER FLUOROSCOPIC GUIDANCE FLUOROSCOPY TIME:  Fluoroscopy Time:  2 minutes 18 seconds Radiation Exposure Index (if provided by the fluoroscopic device): 60.6 mGy Number of Acquired Spot Images: 1 PROCEDURE: Informed consent was obtained from the patient prior to the procedure, including potential complications of headache, allergy, and pain. With the patient prone, the lower back was prepped with Betadine. 1% Lidocaine was used for local anesthesia. Lumbar puncture was performed at the L3-L4 level using a 22 gauge needle with return of clear CSF. Four tubes for total of 10 cc of CSF were obtained for laboratory studies. The patient tolerated the procedure well and there were no apparent complications. IMPRESSION: Successful fluoroscopically directed lumbar puncture. Electronically Signed   By: Maisie Fushomas  Register   On: 05/02/2018 10:23    Medications:  I have reviewed the patient's current medications. Scheduled: . chlorhexidine  15 mL Mouth Rinse BID  . escitalopram  20 mg Oral QHS  . feeding supplement (ENSURE ENLIVE)  237 mL Oral TID BM  . furosemide  40 mg Oral Daily  . hydrochlorothiazide  25 mg Oral Daily  . insulin aspart  0-15 Units Subcutaneous TID WC  . insulin aspart  0-5 Units Subcutaneous QHS  . losartan  50 mg Oral Daily  . magnesium oxide  400 mg Oral BID  . mouth rinse  15 mL Mouth Rinse q12n4p  . metoprolol tartrate  25 mg Oral BID  . multivitamin with minerals  1 tablet Oral Daily  . potassium chloride  40 mEq Oral BID  . senna-docusate  1 tablet Per Tube BID    Assessment:  Found down. Febrile. Septic shock concerns for bacterial meningitis vs HSV encephalitis.  Status post LP due to change in mental status  with noted lethargy and confusion. Prior CT and CTA unremarkable.  Repeat MRI brain per ID  recommendations showed no evidence for acute HSV encephalitis or other intracranial infection. She has been off Acyclovir due to negative MRI brain now back on acyclovir pending CSF analysis. Completed course of antibiotics.  Plan - Status post LP with preliminary results, pending complete CSF analysis - Continue Acyclovir pending HSV results   This patient was staffed with Dr. Loretha BrasilZeylikman, Doyle AskewYuriy who personally evaluated patient, reviewed documentation and agreed with assessment and plan of care as above.  Webb SilversmithElizabeth Ouma, DNP, FNP-BC Board certified Nurse Practitioner Neurology Department  Worsening mental status.  LP has been done with low WBC but lymphocytic predominance.  Don't think this is bacterial in nature Despite imaging, agree with continuation of acyclovir until HSV cultures come back.  Dorene Bruni     LOS: 14 days   05/02/2018  2:34 PM

## 2018-05-02 NOTE — Progress Notes (Signed)
Per Tammy admissions coordinator at [facility]. Patient can D/C to [facility] when medically stable.    Baker Hughes IncorporatedBailey Sayf Kerner, LCSW [phone]

## 2018-05-02 NOTE — Progress Notes (Signed)
Per Tammy admissions coordinator at [facility] they will have to know the LP results before accepting patient. RN aware of above.   Baker Hughes IncorporatedBailey Shaquera Ansley, LCSW [phone]

## 2018-05-02 NOTE — Progress Notes (Signed)
[facility] [place] [facility]   PATIENT NAME: Natalie Knight    MR#:  [id number]  DATE OF BIRTH:  08-30-1936  SUBJECTIVE:  Confused this morning after her LP. Per nurse tech, was awake and talking this morning while eating breakfast. Mental status seems to wax and wane.  REVIEW OF SYSTEMS:  Review of Systems  Unable to perform ROS: Mental status change   DRUG ALLERGIES:  No Known Allergies VITALS:  Blood pressure 129/69, pulse 68, temperature 97.9 F (36.6 C), temperature source Oral, resp. rate 16, height 5\' 5"  (1.651 m), weight 87.3 kg, SpO2 99 %. PHYSICAL EXAMINATION:  Physical Exam  HENT:  Head: Normocephalic and atraumatic.  Eyes: Pupils are equal, round, and reactive to light. EOM are normal. No scleral icterus.  Neck: Normal range of motion. Neck supple. No JVD present. No tracheal deviation present.  Cardiovascular: Normal rate, regular rhythm and normal heart sounds. Exam reveals no gallop.  No murmur heard. Pulmonary/Chest: Breath sounds normal. No respiratory distress. She has no wheezes. She has no rales.  Abdominal: Soft. Bowel sounds are normal. She exhibits no distension. There is no tenderness. There is no rebound.  Musculoskeletal: She exhibits no edema or tenderness.  Neurological: She is alert.  Confused. Opens eyes to voice. Does not answer questions or follow commands. Occasionally mumbling to herself.  Skin: Skin is warm and dry. No rash noted. No erythema.  Psychiatric:  Unable to assess   LABORATORY PANEL:  Female CBC Recent Labs  Lab 05/02/18 0500  WBC 8.4  HGB 8.5*  HCT 26.0*  PLT 146*   ------------------------------------------------------------------------------------------------------------------ Chemistries  Recent Labs  Lab 05/02/18 0500  NA 137  K 3.3*  CL 97*  CO2 32  GLUCOSE 209*  BUN 22  CREATININE 0.82  CALCIUM 9.5  MG 1.7   RADIOLOGY:  Dg Fluoro Guided Loc Of Needle/cath Tip For Spinal Inject  Lt  Result Date: 05/02/2018 CLINICAL DATA:  Altered mental status. EXAM: DIAGNOSTIC LUMBAR PUNCTURE UNDER FLUOROSCOPIC GUIDANCE FLUOROSCOPY TIME:  Fluoroscopy Time:  2 minutes 18 seconds Radiation Exposure Index (if provided by the fluoroscopic device): 60.6 mGy Number of Acquired Spot Images: 1 PROCEDURE: Informed consent was obtained from the patient prior to the procedure, including potential complications of headache, allergy, and pain. With the patient prone, the lower back was prepped with Betadine. 1% Lidocaine was used for local anesthesia. Lumbar puncture was performed at the L3-L4 level using a 22 gauge needle with return of clear CSF. Four tubes for total of 10 cc of CSF were obtained for laboratory studies. The patient tolerated the procedure well and there were no apparent complications. IMPRESSION: Successful fluoroscopically directed lumbar puncture. Electronically Signed   By: Maisie Fushomas  Register   On: 05/02/2018 10:23   ASSESSMENT AND PLAN:   Acute toxic and metabolic encephalopathy- remains confused this morning. Seems to be waxing and waning. Concern for herpes encephalitis, given that her mental status worsened after stopping the acyclovir. May just be delirium in the setting of dementia. - ID and neurology consulted - s/p LP this morning- will f/u on cultures and HSV pcr - acyclovir, ampicillin, and ceftriaxone restarted per ID recommendations  Acute respiratory failure with hypoxia- resolved, now on room air. - vitals per unit routine  AKI due to ATN from hypotension- resolved - monitor  Elevated troponin- likely due to demand ischemia, no active chest pain - monitor  Chronic diabetes mellitus type 2- blood sugars have overall been well-controlled - continue  SSI  History of DVT- stable, no signs of acute DVT on exam. SCDs in place. - holding eliquis due to anemia - will plan to restart eliquis possibly tomorrow  Anemia of chronic disease and acute blood loss. S/p pRBC  transfusion. Hgb has been stable since then. FOBT positive this admission. - will possibly restart eliquis tomorrow - will need to f/u with GI on discharge  Accelerated hypertension- improving. Has been mostly normotensive. - continue HCTZ, losartan, and metoprolol tartrate - hydralazine IV prn  All the records are reviewed and case discussed with Care Management/Social Worker. Management plans discussed with the patient's daughter, and they are in agreement.  CODE STATUS: DNR  TOTAL TIME TAKING CARE OF THIS PATIENT: 40 minutes.   POSSIBLE DISCHARGE TO SNF TOMORROW, PENDING IMPROVEMENT IN MENTAL STATUS.  Jinny BlossomKaty D Ansley Stanwood M.D on 05/02/2018 at 3:02 PM  Between 7am to 6pm - Pager [phone]- [phone]  After 6pm go to [email or web address] - Therapist, nutritionalpassword [email or web address]  [facility] [place] [facility]

## 2018-05-02 NOTE — Progress Notes (Signed)
OT Cancellation Note  Patient Details Name: Natalie Knight MRN: [id number] DOB: October 07, 1935   Cancelled Treatment:    Reason Eval/Treat Not Completed: Active bedrest order. Pt with active bed rest with bathroom privileges only until discharge following lumbar puncture. Pt to lay flat in bed per order. Will hold OT tx until medically appropriate and bed rest order has been completed.   Richrd PrimeJamie Stiller, MPH, MS, OTR/L ascom [phone] 05/02/18, 12:07 PM

## 2018-05-02 NOTE — Progress Notes (Signed)
Pharmacy Electrolyte Monitoring Consult:  Pharmacy consulted to assist in monitoring and replacing electrolytes in this 82 y.o. female admitted on 04/18/2018 with Altered Mental Status  Labs:  Sodium (mmol/L)  Date Value  05/02/2018 137  10/03/2014 142   Potassium (mmol/L)  Date Value  05/02/2018 3.3 (L)  10/03/2014 3.6   Magnesium (mg/dL)  Date Value  16/10/960408/20/2019 1.7   Phosphorus (mg/dL)  Date Value  54/09/811908/20/2019 3.2   Calcium (mg/dL)  Date Value  14/78/295608/20/2019 9.5   Calcium, Total (mg/dL)  Date Value  21/30/865701/21/2016 9.3   Albumin (g/dL)  Date Value  84/69/629508/08/2018 2.7 (L)  10/01/2014 2.6 (L)    Assessment/Plan: Continue Mag-Ox 400 mg PO BID. Ordered Mg sulfate 2 g IV x 1. KCl 40 mEq PO x 1 ordered. Will recheck electrolytes with AM labs.  Will continue to follow daily labs due to possible refeeding risk.    Mauri ReadingSavanna M Martin, PharmD Pharmacy Resident  05/02/2018 7:08 AM

## 2018-05-03 LAB — GLUCOSE, CAPILLARY
GLUCOSE-CAPILLARY: 186 mg/dL — AB (ref 70–99)
Glucose-Capillary: 156 mg/dL — ABNORMAL HIGH (ref 70–99)
Glucose-Capillary: 239 mg/dL — ABNORMAL HIGH (ref 70–99)
Glucose-Capillary: 284 mg/dL — ABNORMAL HIGH (ref 70–99)
Glucose-Capillary: 324 mg/dL — ABNORMAL HIGH (ref 70–99)
Glucose-Capillary: 174 mg/dL — ABNORMAL HIGH (ref 70–99)

## 2018-05-03 LAB — HERPES SIMPLEX VIRUS(HSV) DNA BY PCR
HSV 1 DNA: NEGATIVE
HSV 2 DNA: NEGATIVE

## 2018-05-03 LAB — BASIC METABOLIC PANEL
Anion gap: 6 (ref 5–15)
BUN: 19 mg/dL (ref 8–23)
CALCIUM: 9.4 mg/dL (ref 8.9–10.3)
CHLORIDE: 99 mmol/L (ref 98–111)
CO2: 33 mmol/L — AB (ref 22–32)
CREATININE: 0.85 mg/dL (ref 0.44–1.00)
GFR calc Af Amer: >60 mL/min (ref >60)
GFR calc non Af Amer: >60 mL/min (ref >60)
GLUCOSE: 215 mg/dL — AB (ref 70–99)
Potassium: 3.6 mmol/L (ref 3.5–5.1)
Sodium: 138 mmol/L (ref 135–145)

## 2018-05-03 LAB — VDRL, CSF: SYPHILIS VDRL QUANT CSF: NONREACTIVE

## 2018-05-03 LAB — CBC
HCT: 20.9 % — ABNORMAL LOW (ref 35.0–47.0)
Hemoglobin: 6.9 g/dL — ABNORMAL LOW (ref 12.0–16.0)
MCH: 27.6 pg (ref 26.0–34.0)
MCHC: 33 g/dL (ref 32.0–36.0)
MCV: 83.5 fL (ref 80.0–100.0)
PLATELETS: 130 10*3/uL — AB (ref 150–440)
RBC: 2.5 MIL/uL — ABNORMAL LOW (ref 3.80–5.20)
RDW: 18.6 % — AB (ref 11.5–14.5)
WBC: 6.4 10*3/uL (ref 3.6–11.0)

## 2018-05-03 LAB — RETICULOCYTES
RBC.: 2.99 MIL/uL — AB (ref 3.80–5.20)
RETIC CT PCT: 10.6 % — AB (ref 0.4–3.1)
Retic Count, Absolute: 316.9 10*3/uL — ABNORMAL HIGH (ref 19.0–183.0)

## 2018-05-03 LAB — CYTOLOGY - NON PAP

## 2018-05-03 LAB — IRON AND TIBC
Iron: 54 ug/dL (ref 28–170)
Saturation Ratios: 22 % (ref 10.4–31.8)
TIBC: 248 ug/dL — ABNORMAL LOW (ref 250–450)
UIBC: 194 ug/dL

## 2018-05-03 LAB — FOLATE: FOLATE: 25 ng/mL (ref >5.9)

## 2018-05-03 LAB — FERRITIN: FERRITIN: 262 ng/mL (ref 11–307)

## 2018-05-03 LAB — MAGNESIUM: Magnesium: 1.4 mg/dL — ABNORMAL LOW (ref 1.7–2.4)

## 2018-05-03 LAB — VITAMIN B12: Vitamin B-12: 773 pg/mL (ref 180–914)

## 2018-05-03 LAB — PREPARE RBC (CROSSMATCH)

## 2018-05-03 LAB — OCCULT BLOOD X 1 CARD TO LAB, STOOL: Fecal Occult Bld: NEGATIVE

## 2018-05-03 MED ORDER — MAGNESIUM SULFATE 4 GM/100ML IV SOLN
4.0000 g | Freq: Once | INTRAVENOUS | Status: AC
  Administered 2018-05-03: 4 g via INTRAVENOUS
  Filled 2018-05-03: qty 100

## 2018-05-03 MED ORDER — SODIUM CHLORIDE 0.9% IV SOLUTION: Freq: Once | INTRAVENOUS | Status: DC

## 2018-05-03 NOTE — Progress Notes (Addendum)
Pharmacy Electrolyte Monitoring Consult:  Pharmacy consulted to assist in monitoring and replacing electrolytes in this 82 y.o. female admitted on 04/18/2018 with Altered Mental Status  Labs:  Sodium (mmol/L)  Date Value  05/03/2018 138  10/03/2014 142   Potassium (mmol/L)  Date Value  05/03/2018 3.6  10/03/2014 3.6   Magnesium (mg/dL)  Date Value  40/98/119108/21/2019 1.4 (L)   Phosphorus (mg/dL)  Date Value  47/82/956208/20/2019 3.2   Calcium (mg/dL)  Date Value  13/08/657808/21/2019 9.4   Calcium, Total (mg/dL)  Date Value  46/96/295201/21/2016 9.3   Albumin (g/dL)  Date Value  84/13/244008/08/2018 2.7 (L)  10/01/2014 2.6 (L)    Assessment/Plan: 0821 K 3.6          Mg 1.4 Continue Mag-Ox 400 mg PO BID. Ordered Mg sulfate 4 g IV x 1. KCl 40 mEq PO x 2 daily ordered. Will recheck electrolytes with AM labs.  Will continue to follow daily labs due to possible refeeding risk.    Mauri ReadingSavanna M Martin, PharmD Pharmacy Resident  05/03/2018 7:07 AM

## 2018-05-03 NOTE — Care Management Important Message (Signed)
Important Message  Patient Details  Name: Natalie Knight MRN: [id number] Date of Birth: 02/17/1936   Medicare Important Message Given:  Yes    [name] 05/03/2018, 9:14 AM

## 2018-05-03 NOTE — Consult Note (Signed)
Subjective: Patient asleep but opens eyes to verbal and tactile stimuli. Speech slow and incomprehensible. Daughter at bedside, she report that patient's mental status fluctuates. Sometimes she can communicate well and at other times she is confused and disoriented. She follows commands intermittently. Daughter report that patient was restless at night and did not sleep well.  Objective: Current vital signs: BP (!) 152/76 (BP Location: Right Arm)   Pulse 67   Temp 97.9 F (36.6 C) (Oral)   Resp 16   Ht 5\' 5"  (1.651 m)   Wt 87.3 kg   SpO2 100%   BMI 32.02 kg/m  Vital signs in last 24 hours: Temp:  [97.4 F (36.3 C)-98.5 F (36.9 C)] 97.9 F (36.6 C) (08/21 0743) Pulse Rate:  [65-71] 67 (08/21 0743) Resp:  [16-19] 16 (08/21 0743) BP: (111-152)/(60-89) 152/76 (08/21 0743) SpO2:  [100 %] 100 % (08/21 0743) Weight:  [87.3 kg] 87.3 kg (08/21 0534)  Intake/Output from previous day: 08/20 0701 - 08/21 0700 In: 688 [P.O.:120; I.V.:40; IV Piggyback:528] Out: 0  Intake/Output this shift: No intake/output data recorded. Nutritional status:  Diet Order            DIET DYS 3 Room service appropriate? Yes with Assist; Fluid consistency: Thin  Diet effective now             Neurologic Exam:   Drowsy and awakens only to tactile stimuli  Unable to perform orientation questions, not awake and unable to follow commands.  I. Olfactory not examined  II: Visual fields were full. Pupils were equal, round and reactive to light and accommodation  III,IV, VI: ptosis not present, extra-ocular motions intact bilaterally  V,VII: Deferred  VIII: Deferred  IX, X: Deferred  XI: Deffered UTA  XII: Deffered  Tone is normal in all extremities, no abnormal movements seen   Muscle strength not tested  Deep tendon reflexes were symmetric   Unable to assess sensation  Gait and station not tested  Lab Results: Basic Metabolic Panel: Recent Labs  Lab 04/29/18 0513 04/30/18 0304  05/01/18 0447 05/01/18 1847 05/02/18 0500 05/03/18 0354  NA 141 137 141  --  137 138  K 3.4* 3.8 2.9* 5.6* 3.3* 3.6  CL 103 99 99  --  97* 99  CO2 31 28 35*  --  32 33*  GLUCOSE 164* 122* 111*  --  209* 215*  BUN 17 16 14   --  22 19  CREATININE 0.76 0.72 0.74  --  0.82 0.85  CALCIUM 9.6 9.7 9.7  --  9.5 9.4  MG 1.5* 1.8 1.3*  --  1.7 1.4*  PHOS  --   --   --   --  3.2  --     Liver Function Tests: No results for input(s): AST, ALT, ALKPHOS, BILITOT, PROT, ALBUMIN in the last 168 hours. No results for input(s): LIPASE, AMYLASE in the last 168 hours. No results for input(s): AMMONIA in the last 168 hours.  CBC: Recent Labs  Lab 04/27/18 0436 04/28/18 0732 04/29/18 0513 05/01/18 0448 05/02/18 0500 05/03/18 0354  WBC 13.1*  --   --  10.2 8.4 6.4  NEUTROABS 11.1*  --   --   --   --   --   HGB 8.1* 7.6* 8.2* 8.9* 8.5* 6.9*  HCT 23.3*  --   --  26.6* 26.0* 20.9*  MCV 79.8*  --   --  83.0 83.4 83.5  PLT 151  --   --  144* 146*  130*    Cardiac Enzymes: No results for input(s): CKTOTAL, CKMB, CKMBINDEX, TROPONINI in the last 168 hours.  Lipid Panel: No results for input(s): CHOL, TRIG, HDL, CHOLHDL, VLDL, LDLCALC in the last 168 hours.  CBG: Recent Labs  Lab 05/02/18 1348 05/02/18 1653 05/02/18 2115 05/03/18 0740 05/03/18 1014  GLUCAP 197* 156* 147* 186* 156*    Microbiology: Results for orders placed or performed during the hospital encounter of 04/18/18  Urine culture     Status: None   Collection Time: 04/18/18 12:08 PM  Result Value Ref Range Status   Specimen Description   Final    URINE, RANDOM Performed at [facility], [address] [place], [place] [address]    Special Requests   Final    NONE Performed at [facility], [address] [place], [place] [address]    Culture   Final    NO GROWTH Performed at [facility], [address] [place], [place] [address]    Report Status 04/19/2018 FINAL  Final  Blood Culture  (routine x 2)     Status: None   Collection Time: 04/18/18  1:12 PM  Result Value Ref Range Status   Specimen Description BLOOD RIGHT HAND  Final   Special Requests   Final    BOTTLES DRAWN AEROBIC AND ANAEROBIC Blood Culture results may not be optimal due to an inadequate volume of blood received in culture bottles   Culture   Final    NO GROWTH 5 DAYS Performed at [facility], [address] [place], [place] [address]    Report Status 04/23/2018 FINAL  Final  Blood Culture (routine x 2)     Status: None   Collection Time: 04/18/18  1:20 PM  Result Value Ref Range Status   Specimen Description BLOOD RIGHT [facility]  Final   Special Requests   Final    BOTTLES DRAWN AEROBIC AND ANAEROBIC Blood Culture adequate volume   Culture   Final    NO GROWTH 5 DAYS Performed at [facility], [address] [place], [place] [address]    Report Status 04/23/2018 FINAL  Final  MRSA PCR Screening     Status: None   Collection Time: 04/18/18  5:19 PM  Result Value Ref Range Status   MRSA by PCR NEGATIVE NEGATIVE Final    Comment:        The GeneXpert MRSA Assay (FDA approved for NASAL specimens only), is one component of a comprehensive MRSA colonization surveillance program. It is not intended to diagnose MRSA infection nor to guide or monitor treatment for MRSA infections. Performed at [facility], [address] [place], [place] [address]   CSF culture     Status: None (Preliminary result)   Collection Time: 05/02/18 10:00 AM  Result Value Ref Range Status   Specimen Description   Final    CSF Performed at [facility], [address] [place], [place] [address]    Special Requests   Final    CSF Performed at [facility], [address] [place], [place] [address]    Gram Stain   Final    NO ORGANISMS SEEN FEW RED BLOOD CELLS RARE WBC SEEN Performed at [facility], [address] [place], [place] [address]     Culture   Final    NO GROWTH < 24 HOURS Performed at [facility], [address] [place], [place] [address]  Report Status PENDING  Incomplete  Culture, fungus without smear     Status: None (Preliminary result)   Collection Time: 05/02/18 10:00 AM  Result Value Ref Range Status   Specimen Description   Final    CSF Performed at [facility], [address] [place], [place] [address]    Special Requests   Final    NONE Performed at [facility], [address] [place], [place] [address]    Culture   Final    NO FUNGUS ISOLATED AFTER 1 DAY Performed at [facility], [address] [place], [place] [address]    Report Status PENDING  Incomplete    Coagulation Studies: No results for input(s): LABPROT, INR in the last 72 hours.  Imaging: Dg Fluoro Guided Loc Of Needle/cath Tip For Spinal Inject Lt  Result Date: 05/02/2018 CLINICAL DATA:  Altered mental status. EXAM: DIAGNOSTIC LUMBAR PUNCTURE UNDER FLUOROSCOPIC GUIDANCE FLUOROSCOPY TIME:  Fluoroscopy Time:  2 minutes 18 seconds Radiation Exposure Index (if provided by the fluoroscopic device): 60.6 mGy Number of Acquired Spot Images: 1 PROCEDURE: Informed consent was obtained from the patient prior to the procedure, including potential complications of headache, allergy, and pain. With the patient prone, the lower back was prepped with Betadine. 1% Lidocaine was used for local anesthesia. Lumbar puncture was performed at the L3-L4 level using a 22 gauge needle with return of clear CSF. Four tubes for total of 10 cc of CSF were obtained for laboratory studies. The patient tolerated the procedure well and there were no apparent complications. IMPRESSION: Successful fluoroscopically directed lumbar puncture. Electronically Signed   By: Maisie Fushomas  Register   On: 05/02/2018 10:23    Medications:  I have reviewed the patient's current medications. Scheduled: . sodium chloride   Intravenous Once  .  chlorhexidine  15 mL Mouth Rinse BID  . escitalopram  20 mg Oral QHS  . feeding supplement (ENSURE ENLIVE)  237 mL Oral TID BM  . furosemide  40 mg Oral Daily  . hydrochlorothiazide  25 mg Oral Daily  . insulin aspart  0-15 Units Subcutaneous TID WC  . insulin aspart  0-5 Units Subcutaneous QHS  . losartan  50 mg Oral Daily  . magnesium oxide  400 mg Oral BID  . mouth rinse  15 mL Mouth Rinse q12n4p  . metoprolol tartrate  25 mg Oral BID  . multivitamin with minerals  1 tablet Oral Daily  . potassium chloride  40 mEq Oral BID  . senna-docusate  1 tablet Per Tube BID    Assessment:  Found down. Febrile. Septic shock concerns for bacterial meningitis vs HSV encephalitis.  Status post LP due to change in mental status with noted lethargy and confusion. Prior CT and CTA unremarkable.  Repeat MRI brain per ID  recommendations showed no evidence for acute HSV encephalitis or other intracranial infection. She has been off Acyclovir due to negative MRI brain now back on acyclovir pending CSF analysis. She continues ampicillin for empiric listeria treatment per ID. She has completed pneumococcal meningitis treatment. Mental status fluctuating with continued intermittent confusion/disorientation.  Plan - Continuing acyclovir until HSV DNA  - Recommend intensive PT/OT with at least 2 hours in chair daily - Recommend Melatonin at night to regulate sleep-wake cycle  This patient was staffed with Dr. Loretha BrasilZeylikman, Doyle AskewYuriy who personally evaluated patient, reviewed documentation and agreed with assessment and plan of care as above.  Webb SilversmithElizabeth Nycholas Rayner, DNP, FNP-BC Board certified Nurse Practitioner Neurology Department  LOS: 15 days    05/03/2018  12:06 PM

## 2018-05-03 NOTE — Consult Note (Signed)
GI Inpatient Consult Note  Reason for Consult: Acute blood loss anemia, FOBT +   Attending Requesting Consult: Dr. Nancy MarusMayo, MD  History of Present Illness: Natalie Knight is a 82 y.o. female seen for evaluation of drop in hemoglobin, FOBT positive at the request of Dr. Nancy MarusMayo. PMH is significant for anemia of chronic disease, dementia, gout, DVT on Eliquis. Pt was admitted 08/06 after being found unresponsive in her bed at home, EMS brought to ED where she was found to be febrile and elevated WBC with negative imaging. Pt being treated empirically for bacterial meningitis and herpes encephalitis. LP done yesterday. GI was consulted for acute blood loss anemia. Upon review of chart, during hospitalization pt was found to have Hg 4.9 on 08/13 requiring blood transfusion. Hg has been in the 8.2-8.9 range over the past three days, but was found to be 6.9 this morning.  Per chart review, hemoglobin in 08/2017 was 11.4-11.8. FOBT was positive on 08/15.  Pt seen and examined this afternoon resting comfortably in hospital bed. Pt is somnolent but opens her eyes to voice commands. Speech is not understood. Daughter is not at bedside during time of exam. I attempted to call daughter twice but she did not answer. Per nursing, pt has not had a BM today. Pt denies any abdominal pain and is nontender on exam.    Last Colonoscopy: 08/2007 - report not available via EMR Last Endoscopy: N/A   Past Medical History:  Past Medical History:  Diagnosis Date  . Diabetes mellitus without complication (HCC)   . Hypertension     Problem List: Patient Active Problem List   Diagnosis Date Noted  . Bacterial meningitis   . Sepsis (HCC)   . Anorexia   . Goals of care, counseling/discussion   . Palliative care by specialist   . Meningitis 04/18/2018    Past Surgical History: History reviewed. No pertinent surgical history.  Allergies: No Known Allergies  Home Medications: Medications Prior to Admission  Medication  Sig Dispense Refill Last Dose  . bisacodyl (DULCOLAX) 5 MG EC tablet Take 1 tablet by mouth daily as needed.     . cetirizine (ZYRTEC) 10 MG tablet Take 1 tablet by mouth daily.     Marland Kitchen. docusate sodium (COLACE) 100 MG capsule Take 100 mg by mouth 2 (two) times daily as needed for mild constipation.     Marland Kitchen. ELIQUIS 2.5 MG TABS tablet Take 1 tablet by mouth 2 (two) times daily.     Marland Kitchen. escitalopram (LEXAPRO) 20 MG tablet Take 1 tablet by mouth daily.     Marland Kitchen. gabapentin (NEURONTIN) 100 MG capsule Take 1 capsule by mouth 3 (three) times daily.     Marland Kitchen. losartan (COZAAR) 100 MG tablet Take 1 tablet by mouth daily.     . magnesium oxide (MAG-OX) 400 MG tablet Take 400 mg by mouth daily.     . metFORMIN (GLUCOPHAGE) 500 MG tablet Take 500-1,000 mg by mouth 2 (two) times daily. 1000MG -AM,  500MG -PM     . Multiple Vitamin (MULTI-VITAMINS) TABS Take 1 tablet by mouth daily.     Marland Kitchen. omega-3 acid ethyl esters (LOVAZA) 1 g capsule Take 1 g by mouth 2 (two) times daily.     Marland Kitchen. omeprazole (PRILOSEC) 20 MG capsule Take 1 capsule by mouth daily.     Marland Kitchen. rOPINIRole (REQUIP) 1 MG tablet Take 1 tablet by mouth 2 (two) times daily.     . insulin aspart (NOVOLOG FLEXPEN) 100 UNIT/ML FlexPen WAITING FOR  PRIOR AUTHORIZATION HAS NOT STARTED YET      Home medication reconciliation was completed with the patient.   Scheduled Inpatient Medications:   . sodium chloride   Intravenous Once  . chlorhexidine  15 mL Mouth Rinse BID  . escitalopram  20 mg Oral QHS  . feeding supplement (ENSURE ENLIVE)  237 mL Oral TID BM  . furosemide  40 mg Oral Daily  . hydrochlorothiazide  25 mg Oral Daily  . insulin aspart  0-15 Units Subcutaneous TID WC  . insulin aspart  0-5 Units Subcutaneous QHS  . losartan  50 mg Oral Daily  . magnesium oxide  400 mg Oral BID  . mouth rinse  15 mL Mouth Rinse q12n4p  . metoprolol tartrate  25 mg Oral BID  . multivitamin with minerals  1 tablet Oral Daily  . potassium chloride  40 mEq Oral BID  .  senna-docusate  1 tablet Per Tube BID    Continuous Inpatient Infusions:   . sodium chloride 10 mL/hr at 05/03/18 1321  . acyclovir 700 mg (05/03/18 0141)  . ampicillin (OMNIPEN) IV 2 g (05/03/18 1323)  . magnesium sulfate 1 - 4 g bolus IVPB      PRN Inpatient Medications:  sodium chloride, acetaminophen, albuterol, hydrALAZINE, ondansetron (ZOFRAN) IV, sodium chloride flush  Family History: family history is not on file.  The patient's family history is negative for inflammatory bowel disorders, GI malignancy, or solid organ transplantation.  Social History:   reports that she has never smoked. Her smokeless tobacco use includes snuff. She reports that she does not drink alcohol. The patient denies ETOH, tobacco, or drug use.   Review of Systems: Unable to obtain due to patient's status    Physical Examination: BP (!) 152/76 (BP Location: Right Arm)   Pulse 67   Temp 97.9 F (36.6 C) (Oral)   Resp 16   Ht 5\' 5"  (1.651 m)   Wt 87.3 kg   SpO2 100%   BMI 32.02 kg/m  Gen: NAD, alert but not oriented to person, place, or time HEENT: head normocephalic and atraumatic  Neck: supple, no JVD or thyromegaly Chest: CTA bilaterally, no wheezes, crackles, or other adventitious sounds CV: RRR, no m/g/c/r Abd: soft, NT, ND, +BS in all four quadrants; no HSM, guarding, ridigity, or rebound tenderness Ext: no edema, well perfused with 2+ pulses, Skin: no rash or lesions noted Lymph: no LAD Neuro: Opens eyes to voice, but does not follow commands.   Data: Lab Results  Component Value Date   WBC 6.4 05/03/2018   HGB 6.9 (L) 05/03/2018   HCT 20.9 (L) 05/03/2018   MCV 83.5 05/03/2018   PLT 130 (L) 05/03/2018   Recent Labs  Lab 05/01/18 0448 05/02/18 0500 05/03/18 0354  HGB 8.9* 8.5* 6.9*   Lab Results  Component Value Date   NA 138 05/03/2018   K 3.6 05/03/2018   CL 99 05/03/2018   CO2 33 (H) 05/03/2018   BUN 19 05/03/2018   CREATININE 0.85 05/03/2018   Lab Results   Component Value Date   ALT 94 (H) 04/24/2018   AST 106 (H) 04/24/2018   ALKPHOS 40 04/24/2018   BILITOT 0.9 04/24/2018   No results for input(s): APTT, INR, PTT in the last 168 hours. Assessment/Plan:  82 y/o AA female with a PMH of dementia, hypertension, anemia of chronic disease, gout, and DVT on Eliquis who was admitted 08/06 and is being treated empirically for bacterial meningitis and herpes encephalitis. Lumbar  puncture completed yesterday and we are awaiting HSV DNA. GI was consulted for acute blood loss anemia with drop in hemoglobin  1. Acute blood loss anemia 2. FOBT + - Pt is currently being followed by neurology and infectious disease - No signs of overt GI bleed currently. Pt has received blood transfusion today. - Continue to monitor hemoglobin and hematocrit closely. Transfuse for Hgb <7. - Due to still receiving antibiotics and empiric treatment for possible CNS infection, pt is not currently a candidate for colonoscopy. Pt would likely benefit from scopes in the near future after resolution of CNS infection - Will attempt to review old records. Last colonoscopy 08/2007 but report not currently available. - Continue to hold Eliquis and all other anticoagulation or antiplatelet regimens - Consider colonoscopy when clinically feasible - If hemoglobin continues to drop or patient shows signs of active overt GI bleeding, please call GI immediately   Thank you for the consult. Please call with questions or concerns.  Gilda CreaseGranville M Doraine Schexnider, PA-C [facility]  [phone]

## 2018-05-03 NOTE — Progress Notes (Signed)
[facility] - [place] [facility]   PATIENT NAME: Natalie Knight    MR#:  [id number]  DATE OF BIRTH:  02/14/1936  SUBJECTIVE:  Occasionally talking, but not making sense. Does not open eyes to voice.  REVIEW OF SYSTEMS:  Review of Systems  Unable to perform ROS: Mental status change   DRUG ALLERGIES:  No Known Allergies VITALS:  Blood pressure (!) 152/76, pulse 67, temperature 97.9 F (36.6 C), temperature source Oral, resp. rate 16, height 5\' 5"  (1.651 m), weight 87.3 kg, SpO2 100 %. PHYSICAL EXAMINATION:  Physical Exam  HENT:  Head: Normocephalic and atraumatic.  Eyes: Pupils are equal, round, and reactive to light. EOM are normal. No scleral icterus.  Neck: Normal range of motion. Neck supple. No JVD present. No tracheal deviation present.  Cardiovascular: Normal rate, regular rhythm and normal heart sounds. Exam reveals no gallop.  No murmur heard. Pulmonary/Chest: Breath sounds normal. No respiratory distress. She has no wheezes. She has no rales.  Abdominal: Soft. Bowel sounds are normal. She exhibits no distension. There is no tenderness. There is no rebound.  Musculoskeletal: She exhibits no edema or tenderness.  Neurological: She is alert.  Occasionally talking to herself, but not making sense. Does not open eyes to voice or follow commands.  Skin: Skin is warm and dry. No rash noted. No erythema.  Psychiatric:  Unable to assess   LABORATORY PANEL:  Female CBC Recent Labs  Lab 05/03/18 0354  WBC 6.4  HGB 6.9*  HCT 20.9*  PLT 130*   ------------------------------------------------------------------------------------------------------------------ Chemistries  Recent Labs  Lab 05/03/18 0354  NA 138  K 3.6  CL 99  CO2 33*  GLUCOSE 215*  BUN 19  CREATININE 0.85  CALCIUM 9.4  MG 1.4*   RADIOLOGY:  No results found. ASSESSMENT AND PLAN:   Acute toxic and metabolic encephalopathy- remains confused this morning. Seems to be waxing  and waning. May just be delirium in the setting of dementia. Concern for herpes encephalitis, although viral and bacterial meningitis unlikely with low CSF WBC count. - s/p LP 8/20 - ID and neurology consulted - continue acyclovir until HSV PCR results, per ID recommendations - continue ampicillin for now per ID recommendations - ceftriaxone stopped, as patient has completed pneumococcal meningitis treatment  Acute blood loss anemia with history of anemia of chronic disease- s/p RBC transfusion in the ICU. Hgb has been stable since then, but dropped to 6.9 today. FOBT positive earlier this admission. - continue holding eliquis - will obtain GI consult today (previously planning for outpatient f/u, but patient now requiring second transfusion) - check anemia panel  Acute respiratory failure with hypoxia- resolved, now on room air. - vitals per unit routine  Chronic diabetes mellitus type 2- blood sugars have been well-controlled - continue SSI  History of DVT- stable, no signs of acute DVT on exam. SCDs in place. - holding eliquis due to anemia  Hypertension- improved - continue HCTZ, losartan, and metoprolol tartrate - hydralazine IV prn  All the records are reviewed and case discussed with Care Management/Social Worker. Management plans discussed with the patient's daughter, and they are in agreement.  CODE STATUS: DNR  TOTAL TIME TAKING CARE OF THIS PATIENT: 40 minutes.   POSSIBLE DISCHARGE IN 1-2 DAYS- planning discharge to SNF  Hilton SinclairKaty D Hanifa Antonetti M.D on 05/03/2018 at 11:39 AM  Between 7am to 6pm - Pager - [phone]  After 6pm go to [email or web address] - Therapist, nutritionalpassword EPAS [email or web address]  [facility] [place] [facility]

## 2018-05-03 NOTE — Progress Notes (Signed)
OT Cancellation Note  Patient Details Name: Natalie Knight MRN: [id number] DOB: 10/06/1935   Cancelled Treatment:    Reason Eval/Treat Not Completed: Medical issues which prohibited therapy;Active bedrest order. Per chart review, pt Hgb dropped again to 6.9. Per therapy guidelines, and given that bed rest order remains active, will continue to hold pt for OT treatment and re-attempt at later date/time as pt is medically appropriate.   Richrd PrimeJamie Stiller, MPH, MS, OTR/L ascom [phone] 05/03/18, 7:46 AM

## 2018-05-03 NOTE — Progress Notes (Signed)
Speech Language Pathology Treatment: Dysphagia  Patient Details Name: Natalie Knight MRN: [id number] DOB: 29-Sep-1935 Today's Date: 05/03/2018 Time: 1250-1340 SLP Time Calculation (min) (ACUTE ONLY): 50 min  Assessment / Plan / Recommendation Clinical Impression  Pt seen for ongoing assessment of toleration of recommended oral diet; NSG reported adequate toleration of the Mech Soft diet but that pt's appetite/overall intake waxes/wanes. Pt appeared awake and somewhat verbally responsive (mumbled speech often) answering basic questions re: self and following basic commands to participate in po trials w/ SLP. Overall Cognitive decline noted - pt has a baseline dx of Dementia per MD note.  Pt appeared to adequately tolerate trials of Mech Soft foods and thin liquids following aspiration precautions w/ no immediate, overt s/s of aspiration noted. She presents w/ min oropharyngeal phase dysphagia in general w/ slight-min increased risk for aspiration w/ oral intake d/t the declined Cognitive status/Dementia along w/ the extended illness - this risk is significantly reduced when pt follows general aspiration precautions including SMALL sips of liquids SLOWLY. Pt remains weak and easily fatigued w/ exertion/tasks; she loses focus and requires Moderate verbal/tactile/visual cues to follow through w/ po tasks. Pt consumed trials of thin liquids via CUP, purees, and soft solids w/ no immediate, overt s/s of aspiration noted; no decline in respiratory status noted during/post trials. Oral phase was grossly [facility] for liquids, purees and soft solids during bolus management and clearing of these boluses. Pt exhibited slight-min increased oral phase time w/ soft solids for thorough mastication/clearing. Given time, oral clearing was achieved b/t these trials; also encouraged pt to use lingual sweeping to fully clear as needed. However, pt's mental/Cognitive status impacted her oral awareness for the oral acceptance of po's  and much cueing was needed/given to aid pt taking the po trials orally. Pt was not as inclined to hold her own Cup to drink.  Recommend continue a Mech soft diet w/ thin liquids VIA CUP - NO STRAWS. General aspiration precautions; PILLS WHOLE/CRUSHED IN PUREE. Setup and feeding support at meals - ALLOW PT TO HOLD OWN CUP TO DRINK IF ABLE. NSG updated.No further skilled ST services at this time as pt continues to present as at initial evaluation; the above recommendations would be indicated upon discharge. Education on aspiration precautions given to NSG. Any further services re: her (new) Cognitive baseline in light of Dementia may be indicated at a later time upon discharge to a more long-term placement in order to determine level of assistance w/ ADLs.      HPI HPI: Pt is a 82 year old female with PMHx of Dementia baseline, DM, HTN who is admitted after being found unresponsive by friends, was intubated on 8/6 for airway protection w/ dx'd Septic shock concerns for bacterial meningitis vs HSV encephalitis. MD and Neurology following. Pt has not fully improved from a Cognitive standpoint, however, she does have a baseline dx of Dementia per MD notes(her disease process could have declined w/ this illness/episode).       SLP Plan  All goals met       Recommendations  Diet recommendations: Dysphagia 3 (mechanical soft);Thin liquid Liquids provided via: Cup;No straw Medication Administration: Whole meds with puree(for safer swallowing) Supervision: Staff to assist with self feeding;Full supervision/cueing for compensatory strategies Compensations: Minimize environmental distractions;Slow rate;Small sips/bites;Lingual sweep for clearance of pocketing;Multiple dry swallows after each bite/sip;Follow solids with liquid Postural Changes and/or Swallow Maneuvers: Seated upright 90 degrees;Upright 30-60 min after meal  General recommendations: (dietician f/u) Oral Care  Recommendations: Oral care BID;Staff/trained caregiver to provide oral care Follow up Recommendations: None SLP Visit Diagnosis: Dysphagia, oropharyngeal phase (R13.12)(declined Cognitive status; extended illness) Plan: All goals met       GO                Natalie Kenner, MS, CCC-SLP Natalie Knight 05/03/2018, 4:08 PM

## 2018-05-03 NOTE — Consult Note (Addendum)
Pharmacy Antibiotic Note  Natalie Knight is a [age] y.o. female admitted on 04/18/2018 with meningitis.  Pharmacy has been consulted for Acyclovir dosing.  Plan: D2 Acyclovir Continue Acyclovir 700 mg q8h for worsened mental status. Used adjusted body weight of 69.1 kg.   Will continue to monitor.  Height: 5\' 5"  (165.1 cm) Weight: 192 lb 6.4 oz (87.3 kg) IBW/kg (Calculated) : 57  Temp (24hrs), Avg:97.9 F (36.6 C), Min:97.4 F (36.3 C), Max:98.5 F (36.9 C)  Recent Labs  Lab 04/26/18 1101 04/27/18 0436  04/29/18 0513 04/30/18 0304 05/01/18 0447 05/01/18 0448 05/02/18 0500 05/03/18 0354  WBC  --  13.1*  --   --   --   --  10.2 8.4 6.4  CREATININE  --  0.90   < > 0.76 0.72 0.74  --  0.82 0.85  VANCOTROUGH 17  --   --   --   --   --   --   --   --    < > = values in this interval not displayed.    Estimated Creatinine Clearance: 58.5 mL/min (by C-G formula based on SCr of 0.85 mg/dL).    No Known Allergies  Antimicrobials this admission: Ceftriaxone 0806 >> 0819 Ampicillin 0806 >> 0819>>0820 (per ID) Acyclovir 0806>> 0816 --- 0819>> Vancomycin 0806>>0815   Microbiology results: 8/6 BCx: NG  8/6 UCx: NG  8/6 MRSA PCR: negative  0820 LP not indicative of meningitis, waiting for HSV panel  Thank you for allowing pharmacy to be a part of this patient's care.   Mauri ReadingSavanna M Martin, PharmD Pharmacy Resident  05/03/2018 7:20 AM

## 2018-05-03 NOTE — Progress Notes (Signed)
PT Cancellation Note  Patient Details Name: Natalie Knight MRN: [id number] DOB: September 17, 1935   Cancelled Treatment:    Reason Eval/Treat Not Completed: Medical issues which prohibited therapy. Per chart review Hgb currently 6.9 contraindicating PT at this time. Re attempt when medically appropriate.    Scot DockHeidi E Lucindia Lemley, PTA 05/03/2018, 10:23 AM

## 2018-05-04 LAB — BASIC METABOLIC PANEL
Anion gap: 8 (ref 5–15)
BUN: 22 mg/dL (ref 8–23)
CALCIUM: 10 mg/dL (ref 8.9–10.3)
CO2: 31 mmol/L (ref 22–32)
Chloride: 97 mmol/L — ABNORMAL LOW (ref 98–111)
Creatinine, Ser: 0.86 mg/dL (ref 0.44–1.00)
GFR calc non Af Amer: >60 mL/min (ref >60)
GLUCOSE: 151 mg/dL — AB (ref 70–99)
Potassium: 3.8 mmol/L (ref 3.5–5.1)
Sodium: 136 mmol/L (ref 135–145)

## 2018-05-04 LAB — TYPE AND SCREEN
ABO/RH(D): O POS
Antibody Screen: NEGATIVE
UNIT DIVISION: 0

## 2018-05-04 LAB — BPAM RBC
Blood Product Expiration Date: 201908212359
ISSUE DATE / TIME: 201908211523
UNIT TYPE AND RH: 9500

## 2018-05-04 LAB — MAGNESIUM: Magnesium: 1.5 mg/dL — ABNORMAL LOW (ref 1.7–2.4)

## 2018-05-04 LAB — GLUCOSE, CAPILLARY
Glucose-Capillary: 113 mg/dL — ABNORMAL HIGH (ref 70–99)
Glucose-Capillary: 183 mg/dL — ABNORMAL HIGH (ref 70–99)
Glucose-Capillary: 137 mg/dL — ABNORMAL HIGH (ref 70–99)
Glucose-Capillary: 143 mg/dL — ABNORMAL HIGH (ref 70–99)

## 2018-05-04 LAB — CBC
HEMATOCRIT: 28.4 % — AB (ref 35.0–47.0)
Hemoglobin: 9.6 g/dL — ABNORMAL LOW (ref 12.0–16.0)
MCH: 28.2 pg (ref 26.0–34.0)
MCHC: 33.8 g/dL (ref 32.0–36.0)
MCV: 83.6 fL (ref 80.0–100.0)
Platelets: 170 10*3/uL (ref 150–440)
RBC: 3.4 MIL/uL — ABNORMAL LOW (ref 3.80–5.20)
RDW: 18.2 % — AB (ref 11.5–14.5)
WBC: 8 10*3/uL (ref 3.6–11.0)

## 2018-05-04 MED ORDER — TRAZODONE HCL 50 MG PO TABS
50.0000 mg | ORAL_TABLET | Freq: Every day | ORAL | Status: DC
  Administered 2018-05-04: 50 mg via ORAL
  Filled 2018-05-04: qty 1

## 2018-05-04 MED ORDER — MAGNESIUM SULFATE 4 GM/100ML IV SOLN
4.0000 g | Freq: Once | INTRAVENOUS | Status: AC
  Administered 2018-05-04: 4 g via INTRAVENOUS
  Filled 2018-05-04: qty 100

## 2018-05-04 MED ORDER — METOPROLOL TARTRATE 25 MG PO TABS
12.5000 mg | ORAL_TABLET | Freq: Two times a day (BID) | ORAL | Status: DC
  Administered 2018-05-04 – 2018-05-06 (×3): 12.5 mg via ORAL
  Filled 2018-05-04 (×3): qty 1

## 2018-05-04 MED ORDER — HALOPERIDOL LACTATE 5 MG/ML IJ SOLN: 1.0000 mg | Freq: Four times a day (QID) | INTRAMUSCULAR | Status: DC | PRN

## 2018-05-04 MED ORDER — MAGNESIUM OXIDE 400 (241.3 MG) MG PO TABS
800.0000 mg | ORAL_TABLET | Freq: Two times a day (BID) | ORAL | Status: DC
Start: 2018-05-04 — End: 2018-05-05
  Administered 2018-05-04 – 2018-05-05 (×2): 800 mg via ORAL
  Filled 2018-05-04 (×2): qty 2

## 2018-05-04 NOTE — Progress Notes (Signed)
Physical Therapy Treatment Patient Details Name: Natalie Knight MRN: [id number] DOB: [birth year] Today's Date: 05/04/2018    History of Present Illness Pt is a81 y.o.femalewith a known history that includes DM, HTN, anemia chronic disease, dementia, gout, and DVT on Eliquis presented from her apartment, found unresponsive in the bed, patient nonverbal/not following commands/no eye-opening, family member started CPR, EMS brought to the emergency room, code stroke initiated, patient noted to be comatose, patient electively intubated, patient noted to be tachycardic, hypertensive, potassium 3.3, white count 16,000, CT angios was a negative study, CT head negative, chest x-ray negative, urinalysis noted for acute ketones/dehydration.  Assessment includes: septic shock suspected due to acute bacterial meningitis, leukocytosis, acute toxic and metabolic encephalopathy, AKI, elevated troponin possibly due to demand ischemia, HTN, and anemia of chronic disease and acute blood loss of unclear etiology.    PT Comments    Pt resting in bed, reports BLE and back hurting and feeling cold.  She agreed to exercises this am.  While she did participate more in exercises today, she remained limited with frequent self initiated rest breaks.  Encouragement to complete tasks.  Repositioning encouraged but she declined.  Pt grimacing and moaning often during session due to pain.  Called primary nurse for medication request and pt given more blankets and heat adjusted to improve comfort.  Pt unable to tolerate further mobility at this time.     Follow Up Recommendations  SNF     Equipment Recommendations  None recommended by PT    Recommendations for Other Services       Precautions / Restrictions Precautions Precautions: Fall Precaution Comments: bedrest/bathroom privliges  Restrictions Weight Bearing Restrictions: No    Mobility  Bed Mobility               General bed mobility comments:  deferred  Transfers                 General transfer comment: unsafe to attempt  Ambulation/Gait             General Gait Details: Unable/unsafe to attempt   Stairs             Wheelchair Mobility    Modified Rankin (Stroke Patients Only)       Balance                                            Cognition Arousal/Alertness: Lethargic Behavior During Therapy: Restless Overall Cognitive Status: Impaired/Different from baseline                                        Exercises Other Exercises Other Exercises: BLE AAROM for ankle pumps, heel slides, ab/add and heel slides    General Comments        Pertinent Vitals/Pain Pain Assessment: Faces Faces Pain Scale: Hurts even more Pain Location: back and BLE Pain Descriptors / Indicators: Grimacing;Moaning Pain Intervention(s): Limited activity within patient's tolerance;Monitored during session;Patient requesting pain meds-RN notified    Home Living                      Prior Function            PT Goals (current goals can now be found in the care plan section)  Progress towards PT goals: Not progressing toward goals - comment    Frequency    Min 2X/week      PT Plan Current plan remains appropriate    Co-evaluation              AM-PAC PT "6 Clicks" Daily Activity  Outcome Measure  Difficulty turning over in bed (including adjusting bedclothes, sheets and blankets)?: Unable Difficulty moving from lying on back to sitting on the side of the bed? : Unable Difficulty sitting down on and standing up from a chair with arms (e.g., wheelchair, bedside commode, etc,.)?: Unable Help needed moving to and from a bed to chair (including a wheelchair)?: Total Help needed walking in hospital room?: Total Help needed climbing 3-5 steps with a railing? : Total 6 Click Score: 6    End of Session Equipment Utilized During Treatment: Gait  belt Activity Tolerance: Patient limited by lethargy;Other (comment) Patient left: in bed;with bed alarm set;with call bell/phone within reach;with family/visitor present Nurse Communication: Patient requests pain meds       Time: 0905-0920 PT Time Calculation (min) (ACUTE ONLY): 15 min  Charges:  $Therapeutic Exercise: 8-22 mins                     Danielle Dess, PTA 05/04/18, 9:28 AM

## 2018-05-04 NOTE — Progress Notes (Signed)
Patient ID: Natalie Knight, female   DOB: 02-19-1936, 82 y.o.   MRN: [id number]  [employer] PROGRESS NOTE  Natalie Knight WUJ:[id number] DOB: April 04, 1936 DOA: 04/18/2018 PCP: Barbette Reichmann, MD  HPI/Subjective: Patient knows her name.  She cannot really answer questions.  She was wearing safety mittens.  Objective: Vitals:   05/04/18 0735 05/04/18 0942  BP: 94/82 (!) 166/83  Pulse: 63 64  Resp:    Temp: (!) 97.4 F (36.3 C)   SpO2: 100%     Filed Weights   05/02/18 0434 05/03/18 0534 05/04/18 0421  Weight: 87.3 kg 87.3 kg 87.6 kg    ROS: Review of Systems  Unable to perform ROS: Acuity of condition   Exam: Physical Exam  HENT:  Nose: No mucosal edema.  Mouth/Throat: No oropharyngeal exudate or posterior oropharyngeal edema.  Eyes: Pupils are equal, round, and reactive to light. Conjunctivae and lids are normal.  Neck: No JVD present. Carotid bruit is not present. No edema present. No thyroid mass and no thyromegaly present.  Cardiovascular: S1 normal and S2 normal. Exam reveals no gallop.  No murmur heard. Pulses:      Dorsalis pedis pulses are 2+ on the right side, and 2+ on the left side.  Respiratory: No respiratory distress. She has decreased breath sounds in the right lower field and the left lower field. She has no wheezes. She has no rhonchi. She has no rales.  GI: Soft. Bowel sounds are normal. There is no tenderness.  Musculoskeletal:       Right ankle: She exhibits swelling.       Left ankle: She exhibits swelling.  Lymphadenopathy:    She has no cervical adenopathy.  Neurological: She is alert.  Moves her arms on her own.  Skin: Skin is warm. No rash noted. Nails show no clubbing.  Psychiatric: She has a normal mood and affect.      Data Reviewed: Basic Metabolic Panel: Recent Labs  Lab 04/30/18 0304 05/01/18 0447 05/01/18 1847 05/02/18 0500 05/03/18 0354 05/04/18 0620  NA 137 141  --  137 138 136  K 3.8 2.9* 5.6* 3.3* 3.6 3.8  CL 99  99  --  97* 99 97*  CO2 28 35*  --  32 33* 31  GLUCOSE 122* 111*  --  209* 215* 151*  BUN 16 14  --  22 19 22   CREATININE 0.72 0.74  --  0.82 0.85 0.86  CALCIUM 9.7 9.7  --  9.5 9.4 10.0  MG 1.8 1.3*  --  1.7 1.4* 1.5*  PHOS  --   --   --  3.2  --   --    CBC: Recent Labs  Lab 04/29/18 0513 05/01/18 0448 05/02/18 0500 05/03/18 0354 05/04/18 0620  WBC  --  10.2 8.4 6.4 8.0  HGB 8.2* 8.9* 8.5* 6.9* 9.6*  HCT  --  26.6* 26.0* 20.9* 28.4*  MCV  --  83.0 83.4 83.5 83.6  PLT  --  144* 146* 130* 170    CBG: Recent Labs  Lab 05/03/18 1647 05/03/18 1835 05/03/18 2122 05/04/18 0737 05/04/18 1147  GLUCAP 284* 324* 174* 113* 183*    Recent Results (from the past 240 hour(s))  CSF culture     Status: None (Preliminary result)   Collection Time: 05/02/18 10:00 AM  Result Value Ref Range Status   Specimen Description   Final    CSF Performed at [facility], [address] [place], [place] [address]  Special Requests   Final    CSF Performed at [facility], [address] [place], [place] [address]    Gram Stain   Final    NO ORGANISMS SEEN FEW RED BLOOD CELLS RARE WBC SEEN Performed at [facility], [address], [place], [place] [address]    Culture   Final    NO GROWTH 2 DAYS Performed at [facility], [address] [place], [place] [address]    Report Status PENDING  Incomplete  Culture, fungus without smear     Status: None (Preliminary result)   Collection Time: 05/02/18 10:00 AM  Result Value Ref Range Status   Specimen Description   Final    CSF Performed at [facility], [address] [place], [place] [address]    Special Requests   Final    NONE Performed at [facility], [address], [place], [place] [address]    Culture   Final    NO FUNGUS ISOLATED AFTER 2 DAYS Performed at [facility], [address] [place], [place] [address]    Report Status PENDING  Incomplete       Scheduled Meds: . sodium chloride   Intravenous Once  . chlorhexidine  15 mL Mouth Rinse BID  . escitalopram  20 mg Oral QHS  . feeding supplement (ENSURE ENLIVE)  237 mL Oral TID BM  . furosemide  40 mg Oral Daily  . insulin aspart  0-15 Units Subcutaneous TID WC  . insulin aspart  0-5 Units Subcutaneous QHS  . losartan  50 mg Oral Daily  . magnesium oxide  800 mg Oral BID  . mouth rinse  15 mL Mouth Rinse q12n4p  . metoprolol tartrate  12.5 mg Oral BID  . multivitamin with minerals  1 tablet Oral Daily  . potassium chloride  40 mEq Oral BID  . senna-docusate  1 tablet Per Tube BID  . traZODone  50 mg Oral QHS   Continuous Infusions: . sodium chloride 10 mL/hr at 05/03/18 1827  . acyclovir 700 mg (05/04/18 1338)  . magnesium sulfate 1 - 4 g bolus IVPB 4 g (05/04/18 1454)    Assessment/Plan:  1. Acute delirium.  Start PRN IV Haldol and trazodone at night to try to get sleeping. 2. Acute metabolic encephalopathy.  Not quite sure what was going on initially.  They treated empirically for meningitis and herpes encephalitis.  HSV negative on lumbar puncture.  Case discussed with infectious disease doctor and okay to stop the ampicillin.  Received a full dose of Rocephin.  She will evaluate tomorrow about what to do with the acyclovir. 3. Acute blood loss anemia with history of anemia of a chronic disease.  Received a couple units of transfusion while here.  Eliquis.  Seen by GI.  No further work-up currently. 4. Hypertension.  Discontinue hydrochlorothiazide and lower the dose of metoprolol.  Patient already on Lasix and losartan. 5. Type 2 diabetes mellitus on sliding scale 6. Patient is a DO NOT RESUSCITATE  Code Status:     Code Status Orders  (From admission, onward)         Start     Ordered   04/19/18 1156  Do not attempt resuscitation (DNR)  Continuous    Question Answer Comment  In the event of cardiac or respiratory ARREST Do not call a "code blue"   In the  event of cardiac or respiratory ARREST Do not perform Intubation, CPR,  defibrillation or ACLS   In the event of cardiac or respiratory ARREST Use medication by any route, position, wound care, and other measures to relive pain and suffering. May use oxygen, suction and manual treatment of airway obstruction as needed for comfort.      04/19/18 1155        Code Status History    Date Active Date Inactive Code Status Order ID Comments User Context   04/18/2018 1541 04/19/2018 1155 Full Code [id number]  Salary, Evelena AsaMontell D, MD ED     Family Communication: Spoke with a daughter before I saw the patient in the hallway while the patient was being changed.  When I came back to see the patient the daughter was not there.  I tried reaching the daughter on the phone but unable to reach her when I called. Disposition Plan: Once delirium clears can potentially go out to rehab.  Rehab trying to get another authorization from [facility]  Consultants:  Infectious disease  Neurology  Procedures:  Lumbar puncture  Antibiotics:  Acyclovir  Time spent: 30 minutes with coordination of care speaking with specialist  Alford Highlandichard Kimbria Camposano  [employer]

## 2018-05-04 NOTE — Progress Notes (Signed)
Patient ID: Natalie Knight, female   DOB: 07/30/1936, 82 y.o.   MRN: [id number]  Spoke with daughter Natalie Knight at [phone] Gave update on condition that I can send her out of the hospital with an acute delirium. Also need another insurance authorization from [facility] for [facility]hab. Daughter concerned about the patient's underlying dementia.  Dr. Alford Highlandichard Mariah Harn

## 2018-05-04 NOTE — Progress Notes (Signed)
Pharmacy Electrolyte Monitoring Consult:  Pharmacy consulted to assist in monitoring and replacing electrolytes in this 82 y.o. female admitted on 04/18/2018 with Altered Mental Status  Labs:  Sodium (mmol/L)  Date Value  05/04/2018 136  10/03/2014 142   Potassium (mmol/L)  Date Value  05/04/2018 3.8  10/03/2014 3.6   Magnesium (mg/dL)  Date Value  16/10/960408/22/2019 1.5 (L)   Phosphorus (mg/dL)  Date Value  54/09/811908/20/2019 3.2   Calcium (mg/dL)  Date Value  14/78/295608/22/2019 10.0   Calcium, Total (mg/dL)  Date Value  21/30/865701/21/2016 9.3   Albumin (g/dL)  Date Value  84/69/629508/08/2018 2.7 (L)  10/01/2014 2.6 (L)    Assessment/Plan: 0822 K 3.8          Mg 1.5  Receiving lasix 40 mg PO daily Continue Mag-Ox 400 mg PO BID. Ordered Mg sulfate 4 g IV x 1. KCl 40 mEq PO x 2 daily ordered. Will recheck electrolytes with AM labs.  Will continue to follow daily labs due to possible refeeding risk.    Mauri ReadingSavanna M Martin, PharmD Pharmacy Resident  05/04/2018 8:46 AM

## 2018-05-04 NOTE — Consult Note (Signed)
Subjective: Moves around more this AM, responsive.  Objective: Current vital signs: BP (!) 166/83 (BP Location: Right Arm) Comment: prior to med administration  Pulse 64   Temp (!) 97.4 F (36.3 C) (Oral)   Resp 17   Ht 5\' 5"  (1.651 m)   Wt 87.6 kg   SpO2 100%   BMI 32.14 kg/m  Vital signs in last 24 hours: Temp:  [97.4 F (36.3 C)-98.2 F (36.8 C)] 97.4 F (36.3 C) (08/22 0735) Pulse Rate:  [63-83] 64 (08/22 0942) Resp:  [16-17] 17 (08/21 2324) BP: (93-166)/(60-116) 166/83 (08/22 0942) SpO2:  [100 %] 100 % (08/22 0735) Weight:  [87.6 kg] 87.6 kg (08/22 0421)  Intake/Output from previous day: 08/21 0701 - 08/22 0700 In: 1180.6 [P.O.:240; I.V.:58.4; Blood:300; IV Piggyback:582.3] Out: 0  Intake/Output this shift: Total I/O In: 120 [P.O.:120] Out: -  Nutritional status:  Diet Order            DIET DYS 3 Room service appropriate? Yes with Assist; Fluid consistency: Thin  Diet effective now             Neurologic Exam:   Drowsy and awakens only to tactile stimuli  Unable to perform orientation questions, not awake and unable to follow commands.  I. Olfactory not examined  II: Visual fields were full. Pupils were equal, round and reactive to light and accommodation  III,IV, VI: ptosis not present, extra-ocular motions intact bilaterally  V,VII: Deferred  VIII: Deferred  IX, X: Deferred  XI: Deffered UTA  XII: Deffered  Tone is normal in all extremities, no abnormal movements seen   Muscle strength not tested  Deep tendon reflexes were symmetric   Unable to assess sensation  Gait and station not tested  Lab Results: Basic Metabolic Panel: Recent Labs  Lab 04/30/18 0304 05/01/18 0447 05/01/18 1847 05/02/18 0500 05/03/18 0354 05/04/18 0620  NA 137 141  --  137 138 136  K 3.8 2.9* 5.6* 3.3* 3.6 3.8  CL 99 99  --  97* 99 97*  CO2 28 35*  --  32 33* 31  GLUCOSE 122* 111*  --  209* 215* 151*  BUN 16 14  --  22 19 22   CREATININE 0.72 0.74   --  0.82 0.85 0.86  CALCIUM 9.7 9.7  --  9.5 9.4 10.0  MG 1.8 1.3*  --  1.7 1.4* 1.5*  PHOS  --   --   --  3.2  --   --     Liver Function Tests: No results for input(s): AST, ALT, ALKPHOS, BILITOT, PROT, ALBUMIN in the last 168 hours. No results for input(s): LIPASE, AMYLASE in the last 168 hours. No results for input(s): AMMONIA in the last 168 hours.  CBC: Recent Labs  Lab 04/29/18 0513 05/01/18 0448 05/02/18 0500 05/03/18 0354 05/04/18 0620  WBC  --  10.2 8.4 6.4 8.0  HGB 8.2* 8.9* 8.5* 6.9* 9.6*  HCT  --  26.6* 26.0* 20.9* 28.4*  MCV  --  83.0 83.4 83.5 83.6  PLT  --  144* 146* 130* 170    Cardiac Enzymes: No results for input(s): CKTOTAL, CKMB, CKMBINDEX, TROPONINI in the last 168 hours.  Lipid Panel: No results for input(s): CHOL, TRIG, HDL, CHOLHDL, VLDL, LDLCALC in the last 168 hours.  CBG: Recent Labs  Lab 05/03/18 1218 05/03/18 1647 05/03/18 1835 05/03/18 2122 05/04/18 0737  GLUCAP 239* 284* 324* 174* 113*    Microbiology: Results for orders placed or performed during the  hospital encounter of 04/18/18  Urine culture     Status: None   Collection Time: 04/18/18 12:08 PM  Result Value Ref Range Status   Specimen Description   Final    URINE, RANDOM Performed at [facility], [address] [place], [place] [address]    Special Requests   Final    NONE Performed at [facility], [address] [place], [place] [address]    Culture   Final    NO GROWTH Performed at [facility], [address] [place], [place] [address]    Report Status 04/19/2018 FINAL  Final  Blood Culture (routine x 2)     Status: None   Collection Time: 04/18/18  1:12 PM  Result Value Ref Range Status   Specimen Description BLOOD RIGHT HAND  Final   Special Requests   Final    BOTTLES DRAWN AEROBIC AND ANAEROBIC Blood Culture results may not be optimal due to an inadequate volume of blood received in culture bottles   Culture   Final    NO  GROWTH 5 DAYS Performed at [facility], [address] [place], [place] [address]    Report Status 04/23/2018 FINAL  Final  Blood Culture (routine x 2)     Status: None   Collection Time: 04/18/18  1:20 PM  Result Value Ref Range Status   Specimen Description BLOOD RIGHT [facility]  Final   Special Requests   Final    BOTTLES DRAWN AEROBIC AND ANAEROBIC Blood Culture adequate volume   Culture   Final    NO GROWTH 5 DAYS Performed at [facility], [address] [place], [place] [address]    Report Status 04/23/2018 FINAL  Final  MRSA PCR Screening     Status: None   Collection Time: 04/18/18  5:19 PM  Result Value Ref Range Status   MRSA by PCR NEGATIVE NEGATIVE Final    Comment:        The GeneXpert MRSA Assay (FDA approved for NASAL specimens only), is one component of a comprehensive MRSA colonization surveillance program. It is not intended to diagnose MRSA infection nor to guide or monitor treatment for MRSA infections. Performed at [facility], [address] [place], [place] [address]   CSF culture     Status: None (Preliminary result)   Collection Time: 05/02/18 10:00 AM  Result Value Ref Range Status   Specimen Description   Final    CSF Performed at [facility], [address], [place], [place] [address]    Special Requests   Final    CSF Performed at [facility], [address] [place], [place] [address]    Gram Stain   Final    NO ORGANISMS SEEN FEW RED BLOOD CELLS RARE WBC SEEN Performed at [facility], [address] [place], [place] [address]    Culture   Final    NO GROWTH 2 DAYS Performed at [facility], [address] [place], [place] [address]    Report Status PENDING  Incomplete  Culture, fungus without smear     Status: None (Preliminary result)   Collection Time: 05/02/18 10:00 AM  Result Value Ref Range Status   Specimen Description   Final    CSF Performed at  [facility], [address] [place], [place] [address]    Special Requests   Final    NONE Performed at [facility], [address] [place], [place] [address]    Culture   Final    NO FUNGUS ISOLATED AFTER 2 DAYS Performed at [facility], [address] [place], [place] [address]    Report Status PENDING  Incomplete    Coagulation Studies: No results for input(s): LABPROT, INR in the last 72 hours.  Imaging: No results found.  Medications:  I have reviewed the patient's current medications. Scheduled: . sodium chloride   Intravenous Once  . chlorhexidine  15 mL Mouth Rinse BID  . escitalopram  20 mg Oral QHS  . feeding supplement (ENSURE ENLIVE)  237 mL Oral TID BM  . furosemide  40 mg Oral Daily  . insulin aspart  0-15 Units Subcutaneous TID WC  . insulin aspart  0-5 Units Subcutaneous QHS  . losartan  50 mg Oral Daily  . magnesium oxide  800 mg Oral BID  . mouth rinse  15 mL Mouth Rinse q12n4p  . metoprolol tartrate  12.5 mg Oral BID  . multivitamin with minerals  1 tablet Oral Daily  . potassium chloride  40 mEq Oral BID  . senna-docusate  1 tablet Per Tube BID  . traZODone  50 mg Oral QHS    Assessment:  Found down. Febrile. Septic shock concerns for bacterial meningitis vs HSV encephalitis.  Status post LP due to change in mental status with noted lethargy and confusion. Prior CT and CTA unremarkable.  Repeat MRI brain per ID  recommendations showed no evidence for acute HSV encephalitis or other intracranial infection. She has been off Acyclovir due to negative MRI brain now back on acyclovir pending CSF analysis. She continues ampicillin for empiric listeria treatment per ID. She has completed pneumococcal meningitis treatment. Mental status fluctuating with continued intermittent confusion/disorientation.  Plan - HSV negative, agree with discontinuation of Acyclovir - Would consider trazodone/seroquel at night for better sleep/wake  cycle - will d/c bedrest and PT to get out of bed - if possible d/c R IJ and place peripherals.  - ID input appreciated - Call with questions     LOS: 16 days    05/04/2018  11:44 AM

## 2018-05-04 NOTE — Progress Notes (Signed)
Per MD LP results are negative. Tammy admissions coordinator at [facility] is aware of above. Per Tammy she had to re-start [facility] authorization today because it has expired. Clinical Social Worker (CSW) will continue to follow and assist as needed.    [employer]iley Deztinee Lohmeyer, LCSW [phone]

## 2018-05-04 NOTE — Progress Notes (Signed)
OT Cancellation Note  Patient Details Name: Natalie Knight MRN: [id number] DOB: [birth year]   Cancelled Treatment:    Reason Eval/Treat Not Completed: Patient declined, no reason specified.  Pt declined OT today stating she was tired after PT session and still cold and achey. Will re-assess tomorrow to continue OT treatments.    Susanne BordersSusan Wofford, OTR/L ascom [phone] 05/04/18, 1:28 PM

## 2018-05-04 NOTE — Progress Notes (Signed)
GI Inpatient Follow-up Note  HPI: Natalie Knight is a 82 y.o. female with dementia noted to have anemia with FOBT+.   Patient seen and examined this afternoon. Pt is slightly more agitated today. She fails to answer questions. Per nursing, pt had small volume brown BM this morning without any bright red blood or dark, tarry stool. Hemoglobin has increased today to 9.9. No acute events overnight or signs of active GI bleed.   Scheduled Inpatient Medications:  . sodium chloride   Intravenous Once  . chlorhexidine  15 mL Mouth Rinse BID  . escitalopram  20 mg Oral QHS  . feeding supplement (ENSURE ENLIVE)  237 mL Oral TID BM  . furosemide  40 mg Oral Daily  . insulin aspart  0-15 Units Subcutaneous TID WC  . insulin aspart  0-5 Units Subcutaneous QHS  . losartan  50 mg Oral Daily  . magnesium oxide  800 mg Oral BID  . mouth rinse  15 mL Mouth Rinse q12n4p  . metoprolol tartrate  12.5 mg Oral BID  . multivitamin with minerals  1 tablet Oral Daily  . potassium chloride  40 mEq Oral BID  . senna-docusate  1 tablet Per Tube BID  . traZODone  50 mg Oral QHS    Continuous Inpatient Infusions:   . sodium chloride 10 mL/hr at 05/03/18 1827  . acyclovir 700 mg (05/04/18 0536)  . magnesium sulfate 1 - 4 g bolus IVPB      PRN Inpatient Medications:  sodium chloride, acetaminophen, albuterol, haloperidol lactate, hydrALAZINE, ondansetron (ZOFRAN) IV, sodium chloride flush  Review of Systems: Constitutional: Weight is stable.  Eyes: No changes in vision. ENT: No oral lesions, sore throat.  GI: see HPI.  Heme/Lymph: No easy bruising.  CV: No chest pain.  GU: No hematuria.  Integumentary: No rashes.  Neuro: No headaches.  Psych: No depression/anxiety.  Endocrine: No heat/cold intolerance.  Allergic/Immunologic: No urticaria.  Resp: No cough, SOB.  Musculoskeletal: No joint swelling.    Physical Examination: BP (!) 166/83 (BP Location: Right Arm) Comment: prior to med administration   Pulse 64   Temp (!) 97.4 F (36.3 C) (Oral)   Resp 17   Ht 5\' 5"  (1.651 m)   Wt 87.6 kg   SpO2 100%   BMI 32.14 kg/m  Gen: NAD, alert and oriented x 4 HEENT: PEERLA, EOMI, Neck: supple, no JVD or thyromegaly Chest: CTA bilaterally, no wheezes, crackles, or other adventitious sounds CV: RRR, no m/g/c/r Abd: soft, NT, ND, +BS in all four quadrants; no HSM, guarding, ridigity, or rebound tenderness Ext: no edema, well perfused with 2+ pulses, Skin: no rash or lesions noted Lymph: no LAD  Data: Lab Results  Component Value Date   WBC 8.0 05/04/2018   HGB 9.6 (L) 05/04/2018   HCT 28.4 (L) 05/04/2018   MCV 83.6 05/04/2018   PLT 170 05/04/2018   Recent Labs  Lab 05/02/18 0500 05/03/18 0354 05/04/18 0620  HGB 8.5* 6.9* 9.6*   Lab Results  Component Value Date   NA 136 05/04/2018   K 3.8 05/04/2018   CL 97 (L) 05/04/2018   CO2 31 05/04/2018   BUN 22 05/04/2018   CREATININE 0.86 05/04/2018   Lab Results  Component Value Date   ALT 94 (H) 04/24/2018   AST 106 (H) 04/24/2018   ALKPHOS 40 04/24/2018   BILITOT 0.9 04/24/2018   No results for input(s): APTT, INR, PTT in the last 168 hours. Assessment/Plan: Ms. Natalie Knight is a  82 y.o. female seen for acute blood loss anemia and FOBT+  - Hemoglobin has increased today to 9.6. No signs of active GI bleeding.  - Continue to follow H/H - Hold off on endoluminal evaluation given encephalitis - GI will sign off at this time. Please call back with any future concerns or evidence of active bleeding. - After resolution of encephalitis, pt would benefit from outpatient follow-up with GI for further discussion about endoscopic procedures   Please call with questions or concerns.    Jacob Moores, PA-C [facility]

## 2018-05-04 NOTE — Consult Note (Addendum)
Pharmacy Antibiotic Note  Natalie Knight is a 82 y.o. female admitted on 04/18/2018 with meningitis.  Pharmacy has been consulted for Acyclovir dosing.  Plan: Continue Acyclovir 700 mg q8h for worsened mental status. Used adjusted body weight of 69.1 kg.   Will continue to monitor.  Height: 5\' 5"  (165.1 cm) Weight: 193 lb 2 oz (87.6 kg) IBW/kg (Calculated) : 57  Temp (24hrs), Avg:97.8 F (36.6 C), Min:97.4 F (36.3 C), Max:98.2 F (36.8 C)  Recent Labs  Lab 04/30/18 0304 05/01/18 0447 05/01/18 0448 05/02/18 0500 05/03/18 0354 05/04/18 0620  WBC  --   --  10.2 8.4 6.4 8.0  CREATININE 0.72 0.74  --  0.82 0.85 0.86    Estimated Creatinine Clearance: 57.9 mL/min (by C-G formula based on SCr of 0.86 mg/dL).    No Known Allergies  Antimicrobials this admission: Ceftriaxone 0806 >> 0819 Ampicillin 0806 >> 0819>>0820 (per ID) Acyclovir 0806>> 0816 --- 0819>> Vancomycin 0806>>0815   Microbiology results: 8/6 BCx: NG  8/6 UCx: NG  8/6 MRSA PCR: negative  0820 LP not indicative of meningitis, waiting for HSV panel 0820 fungal cx NGTD  Thank you for allowing pharmacy to be a part of this patient's care.   Mauri ReadingSavanna M Martin, PharmD Pharmacy Resident  05/04/2018 9:10 AM

## 2018-05-05 LAB — BASIC METABOLIC PANEL
Anion gap: 4 — ABNORMAL LOW (ref 5–15)
BUN: 20 mg/dL (ref 8–23)
CALCIUM: 10.5 mg/dL — AB (ref 8.9–10.3)
CHLORIDE: 99 mmol/L (ref 98–111)
CO2: 34 mmol/L — ABNORMAL HIGH (ref 22–32)
CREATININE: 0.81 mg/dL (ref 0.44–1.00)
Glucose, Bld: 168 mg/dL — ABNORMAL HIGH (ref 70–99)
Potassium: 4.1 mmol/L (ref 3.5–5.1)
SODIUM: 137 mmol/L (ref 135–145)

## 2018-05-05 LAB — GLUCOSE, CAPILLARY
GLUCOSE-CAPILLARY: 100 mg/dL — AB (ref 70–99)
Glucose-Capillary: 135 mg/dL — ABNORMAL HIGH (ref 70–99)
Glucose-Capillary: 179 mg/dL — ABNORMAL HIGH (ref 70–99)
Glucose-Capillary: 123 mg/dL — ABNORMAL HIGH (ref 70–99)

## 2018-05-05 LAB — CSF CULTURE W GRAM STAIN
Culture: NO GROWTH
Gram Stain: NONE SEEN

## 2018-05-05 LAB — MAGNESIUM: MAGNESIUM: 2 mg/dL (ref 1.7–2.4)

## 2018-05-05 LAB — CSF CULTURE

## 2018-05-05 MED ORDER — HYDRALAZINE HCL 20 MG/ML IJ SOLN
10.0000 mg | INTRAMUSCULAR | Status: DC | PRN
  Administered 2018-05-05: 10 mg via INTRAVENOUS
  Filled 2018-05-05: qty 1

## 2018-05-05 MED ORDER — MAGNESIUM OXIDE 400 (241.3 MG) MG PO TABS: 400.0000 mg | ORAL_TABLET | Freq: Two times a day (BID) | ORAL | Status: DC

## 2018-05-05 MED ORDER — POTASSIUM CHLORIDE CRYS ER 20 MEQ PO TBCR: 40.0000 meq | EXTENDED_RELEASE_TABLET | Freq: Every day | ORAL | Status: DC

## 2018-05-05 NOTE — Progress Notes (Signed)
Patient ID: Natalie Knight, female   DOB: 1935-11-01, 82 y.o.   MRN: [id number]  [employer] PROGRESS NOTE  SHALANDRIA ELSBERND WUJ:[id number] DOB: 29-Apr-1936 DOA: 04/18/2018 PCP: Barbette Reichmann, MD  HPI/Subjective: Patient was a little agitated overnight.  But was sleeping this morning.  Apparently she woke up and ate breakfast.  When I came in the room I sternal rub during she only moaned.  Objective: Vitals:   05/04/18 2129 05/05/18 0801  BP: (!) 140/111 125/83  Pulse: 68 63  Resp:  18  Temp: 98.7 F (37.1 C) 98 F (36.7 C)  SpO2:  100%    Filed Weights   05/03/18 0534 05/04/18 0421 05/05/18 0500  Weight: 87.3 kg 87.6 kg 81.5 kg    ROS: Review of Systems  Unable to perform ROS: Acuity of condition   Exam: Physical Exam  Constitutional: She appears lethargic.  HENT:  Nose: No mucosal edema.  Mouth/Throat: No oropharyngeal exudate or posterior oropharyngeal edema.  Eyes: Pupils are equal, round, and reactive to light. Conjunctivae and lids are normal.  Neck: No JVD present. Carotid bruit is not present. No edema present. No thyroid mass and no thyromegaly present.  Cardiovascular: S1 normal and S2 normal. Exam reveals no gallop.  No murmur heard. Pulses:      Dorsalis pedis pulses are 2+ on the right side, and 2+ on the left side.  Respiratory: No respiratory distress. She has decreased breath sounds in the right lower field and the left lower field. She has no wheezes. She has no rhonchi. She has no rales.  GI: Soft. Bowel sounds are normal. There is no tenderness.  Musculoskeletal:       Right ankle: She exhibits swelling.       Left ankle: She exhibits swelling.  Lymphadenopathy:    She has no cervical adenopathy.  Neurological: She appears lethargic.  Moans with sternal rub  Skin: Skin is warm. No rash noted. Nails show no clubbing.  Psychiatric:  Moans with sternal rub      Data Reviewed: Basic Metabolic Panel: Recent Labs  Lab 05/01/18 0447  05/01/18 1847 05/02/18 0500 05/03/18 0354 05/04/18 0620 05/05/18 0520  NA 141  --  137 138 136 137  K 2.9* 5.6* 3.3* 3.6 3.8 4.1  CL 99  --  97* 99 97* 99  CO2 35*  --  32 33* 31 34*  GLUCOSE 111*  --  209* 215* 151* 168*  BUN 14  --  22 19 22 20   CREATININE 0.74  --  0.82 0.85 0.86 0.81  CALCIUM 9.7  --  9.5 9.4 10.0 10.5*  MG 1.3*  --  1.7 1.4* 1.5* 2.0  PHOS  --   --  3.2  --   --   --    CBC: Recent Labs  Lab 04/29/18 0513 05/01/18 0448 05/02/18 0500 05/03/18 0354 05/04/18 0620  WBC  --  10.2 8.4 6.4 8.0  HGB 8.2* 8.9* 8.5* 6.9* 9.6*  HCT  --  26.6* 26.0* 20.9* 28.4*  MCV  --  83.0 83.4 83.5 83.6  PLT  --  144* 146* 130* 170    CBG: Recent Labs  Lab 05/04/18 1147 05/04/18 1628 05/04/18 2010 05/05/18 0802 05/05/18 1156  GLUCAP 183* 137* 143* 135* 179*    Recent Results (from the past 240 hour(s))  CSF culture     Status: None (Preliminary result)   Collection Time: 05/02/18 10:00 AM  Result Value Ref Range Status   Specimen  Description   Final    CSF Performed at [facility], [address] [place], [place] [address]    Special Requests   Final    CSF Performed at [facility], [address] [place], [place] [address]    Gram Stain   Final    NO ORGANISMS SEEN FEW RED BLOOD CELLS RARE WBC SEEN Performed at [facility], [address], [place], [place] [address]    Culture   Final    NO GROWTH 3 DAYS Performed at [facility], [address], [place], [place] [address]    Report Status PENDING  Incomplete  Culture, fungus without smear     Status: None (Preliminary result)   Collection Time: 05/02/18 10:00 AM  Result Value Ref Range Status   Specimen Description   Final    CSF Performed at [facility], [address], [place], [place] [address]    Special Requests   Final    NONE Performed at [facility], [address], [place], [place] [address]    Culture   Final    NO  FUNGUS ISOLATED AFTER 3 DAYS Performed at [facility], [address], [place], [place] [address]    Report Status PENDING  Incomplete      Scheduled Meds: . sodium chloride   Intravenous Once  . chlorhexidine  15 mL Mouth Rinse BID  . escitalopram  20 mg Oral QHS  . feeding supplement (ENSURE ENLIVE)  237 mL Oral TID BM  . furosemide  40 mg Oral Daily  . insulin aspart  0-15 Units Subcutaneous TID WC  . insulin aspart  0-5 Units Subcutaneous QHS  . losartan  50 mg Oral Daily  . magnesium oxide  400 mg Oral BID  . mouth rinse  15 mL Mouth Rinse q12n4p  . metoprolol tartrate  12.5 mg Oral BID  . multivitamin with minerals  1 tablet Oral Daily  . [START ON 05/06/2018] potassium chloride  40 mEq Oral Daily  . senna-docusate  1 tablet Per Tube BID  . traZODone  50 mg Oral QHS   Continuous Infusions: . sodium chloride 10 mL/hr at 05/03/18 1827    Assessment/Plan:  1. Acute delirium.  Continue PRN IV Haldol and trazodone at night to try to get sleeping.  Would like to see more periods of better cognition prior to disposition. 2. Acute metabolic encephalopathy.   They treated empirically for meningitis and herpes encephalitis.  HSV negative on lumbar puncture.  Infectious disease likely will stop the acyclovir today.  Received a full course of Rocephin. 3. Acute blood loss anemia with history of anemia of a chronic disease.  Received a couple units of transfusion while here.  Eliquis on hold.  Seen by GI.  No further work-up currently. 4. Hypertension.  Discontinue hydrochlorothiazide and lower the dose of metoprolol.  Patient already on Lasix and losartan. 5. Type 2 diabetes mellitus on sliding scale 6. Patient is a DO NOT RESUSCITATE  Code Status:     Code Status Orders  (From admission, onward)         Start     Ordered   04/19/18 1156  Do not attempt resuscitation (DNR)  Continuous    Question Answer Comment  In the event of cardiac or respiratory ARREST Do not call  a "code blue"   In the event of cardiac or respiratory ARREST Do not perform Intubation, CPR, defibrillation or ACLS  In the event of cardiac or respiratory ARREST Use medication by any route, position, wound care, and other measures to relive pain and suffering. May use oxygen, suction and manual treatment of airway obstruction as needed for comfort.      04/19/18 1155        Code Status History    Date Active Date Inactive Code Status Order ID Comments User Context   04/18/2018 1541 04/19/2018 1155 Full Code [id number]  Salary, Evelena AsaMontell D, MD ED     Family Communication: Spoke with a daughter and family at the bedside Disposition Plan: Once delirium clears can potentially go out to rehab.  Rehab obtained another authorization from [facility] so [facility]  Consultants:  Infectious disease  Neurology  Procedures:  Lumbar puncture  Antibiotics:  Acyclovir  Time spent: 28 minutes  Johm Pfannenstiel [employer]  [employer]

## 2018-05-05 NOTE — Progress Notes (Signed)
OT Cancellation Note  Patient Details Name: Natalie Knight MRN: [id number] DOB: May 10, 1936   Cancelled Treatment:    Reason Eval/Treat Not Completed: Patient's level of consciousness Attempted to see patient but she did not arouse to participate in session.  Will resume therapy on Monday 8/26 but at this point is not progressing in therapy due to lethargy or declining to participate this week.  Will re-assess on Monday.  Susanne BordersSusan Delrick Dehart, OTR/L ascom [phone] 05/05/18, 3:47 PM

## 2018-05-05 NOTE — Progress Notes (Addendum)
Pharmacy Electrolyte Monitoring Consult:  Pharmacy consulted to assist in monitoring and replacing electrolytes in this 82 y.o. female admitted on 04/18/2018 with Altered Mental Status  Labs:  Sodium (mmol/L)  Date Value  05/05/2018 137  10/03/2014 142   Potassium (mmol/L)  Date Value  05/05/2018 4.1  10/03/2014 3.6   Magnesium (mg/dL)  Date Value  16/10/960408/23/2019 2.0   Phosphorus (mg/dL)  Date Value  54/09/811908/20/2019 3.2   Calcium (mg/dL)  Date Value  14/78/295608/23/2019 10.5 (H)   Calcium, Total (mg/dL)  Date Value  21/30/865701/21/2016 9.3   Albumin (g/dL)  Date Value  84/69/629508/08/2018 2.7 (L)  10/01/2014 2.6 (L)    Assessment/Plan: 0823 K 4.1          Mg 2.0  Receiving lasix 40 mg PO daily Reduce Mag-Ox  400 mg PO BID. Reduce KCl 40 mEq PO BID to once daily. No need for further electrolyte replacement today. Will recheck electrolytes with AM labs.  Will continue to follow daily labs due to possible refeeding risk.    Mauri ReadingSavanna M Martin, PharmD Pharmacy Resident  05/05/2018 7:02 AM

## 2018-05-05 NOTE — Progress Notes (Signed)
Rounded with MD at bedside family updated about pln of care and treatment . Patient lethargic arousal with verbal tactile , will continue to monitor

## 2018-05-05 NOTE — Care Management Important Message (Signed)
Important Message  Patient Details  Name: Natalie Knight MRN: [id number] Date of Birth: 1936-09-05   Medicare Important Message Given:  Yes    Olegario MessierKathy A [name] 05/05/2018, 1:26 PM

## 2018-05-05 NOTE — Progress Notes (Signed)
Per [name] liaison [facility] authorization has been received and [facility] can come to [facility] over the weekend. Per MD patient may be stable for D/C over the weekend. Clinical Social Worker (CSW) will continue to follow and assist as needed.   Baker Hughes IncorporatedBailey Nimai Burbach, LCSW [phone]

## 2018-05-05 NOTE — Progress Notes (Signed)
Physical Therapy Treatment Patient Details Name: Natalie Knight MRN: [id number] DOB: 1935-12-30 Today's Date: 05/05/2018    History of Present Illness Pt is a81 y.o.femalewith a known history that includes DM, HTN, anemia chronic disease, dementia, gout, and DVT on Eliquis presented from her apartment, found unresponsive in the bed, patient nonverbal/not following commands/no eye-opening, family member started CPR, EMS brought to the emergency room, code stroke initiated, patient noted to be comatose, patient electively intubated, patient noted to be tachycardic, hypertensive, potassium 3.3, white count 16,000, CT angios was a negative study, CT head negative, chest x-ray negative, urinalysis noted for acute ketones/dehydration.  Assessment includes: septic shock suspected due to acute bacterial meningitis, leukocytosis, acute toxic and metabolic encephalopathy, AKI, elevated troponin possibly due to demand ischemia, HTN, and anemia of chronic disease and acute blood loss of unclear etiology.    PT Comments    Pt very lethargic this session with limited ability to follow any commands or respond appropriately to questions.  Bedrest order noted in chart with Dr. Hilton Sinclair called who stated pt is not on bedrest and can be activity as tolerated. Pt was able to occasionally participate with BLE therex per below but would quickly fall back asleep without near constant cueing.  Pt was unsafe to attempt to sit up to the EOB this session secondary to level of alertness, nursing notified.   Pt will benefit from PT services in a SNF setting upon discharge to safely address above deficits for decreased caregiver assistance and eventual return to PLOF.   Follow Up Recommendations  SNF     Equipment Recommendations  None recommended by PT    Recommendations for Other Services       Precautions / Restrictions Precautions Precautions: Fall Precaution Comments: Per Dr. Renae Gloss 05/05/18 pt is activity as  tolerated, not on bed rest Restrictions Weight Bearing Restrictions: No    Mobility  Bed Mobility               General bed mobility comments: deferred  Transfers                 General transfer comment: unsafe to attempt  Ambulation/Gait             General Gait Details: Unable/unsafe to attempt   Stairs             Wheelchair Mobility    Modified Rankin (Stroke Patients Only)       Balance                                            Cognition Arousal/Alertness: Lethargic Behavior During Therapy: Restless Overall Cognitive Status: Impaired/Different from baseline Area of Impairment: Orientation;Attention;Memory;Following commands;Awareness;Safety/judgement;Problem solving                 Orientation Level: Disoriented to;Place;Time;Situation     Following Commands: (Pt unable to follow commands during session) Safety/Judgement: Decreased awareness of safety;Decreased awareness of deficits            Exercises Total Joint Exercises Ankle Circles/Pumps: AAROM;Both;5 reps;10 reps Short Arc Quad: AAROM;Both;10 reps;5 reps Heel Slides: AAROM;Both;5 reps;10 reps Hip ABduction/ADduction: AAROM;Both;5 reps;10 reps Straight Leg Raises: AAROM;Both;5 reps;10 reps Other Exercises Other Exercises: Positioning education provided    General Comments        Pertinent Vitals/Pain Pain Assessment: Faces Pain Score: 0-No pain    Home Living  Prior Function            PT Goals (current goals can now be found in the care plan section)      Frequency    Min 2X/week      PT Plan Current plan remains appropriate    Co-evaluation              AM-PAC PT "6 Clicks" Daily Activity  Outcome Measure                   End of Session   Activity Tolerance: Patient limited by lethargy Patient left: in bed;with bed alarm set;with call bell/phone within reach;with  family/visitor present Nurse Communication: Mobility status;Other (comment)(Pt's decreased level of alertness) PT Visit Diagnosis: Muscle weakness (generalized) (M62.81);Difficulty in walking, not elsewhere classified (R26.2)     Time: [id number] PT Time Calculation (min) (ACUTE ONLY): 17 min  Charges:  $Therapeutic Exercise: 8-22 mins                     D. Scott Mose Colaizzi PT, DPT 05/05/18, 1:09 PM

## 2018-05-05 NOTE — Progress Notes (Signed)
Subjective Pt sleeping-  Fluctuating level of alertness- in the mornings she is more awake, responds to simple questions verbally and has breakfast. The rest of the day she sleeps  No fever X 15 days   History  Natalie Knight is a 82 y.o. female with a history of diabetes mellitus, hypertension, vascular dementia, previous CVA recent left DVT on Eliquis was admitted to the hospital on April 18, 2018 after being found unresponsive at home.  Patient lives on her own and her daughter and daughter-in-law visits her and helps with shopping cooking etc. her daughter had seen her over the weekend of August third and fourth and patient was doing all right and was at her baseline.  On August 5 night she called her and patient was not sounding right on the phone.  She asked her what was wrong and the patient said she did not know what was wrong with her.  Told her not to worry about her.  Next morning the daughter went to her home but patient did not come to the door .  So she called her brother who had the key .  They found their mother on the bed unresponsive.  They put her on the floor and tried to resuscitate her and called 911.  When 911 reached her home patient was responding to pain and was breathing with normal blood pressure.  Sugar was more than 200.  She was brought to [facility] ED. code stroke was initiated and patient was electively intubated.  She was noted to have a temperature of 102.8 which later increased to 104.7. she was tachycardic and hypertensive with a white count of 16,000.  CT angio   was done and it was a negative study.  CT head was negative chest x-ray was normal and the UA was noted for ketones. In the  ED she was noted to have positive meningismus or nuchal rigidity with decerebrate bilateral posturing noted and so acute sepsis was suspected along with acute bacterial meningitis.  As she was on Eliquis , lumbar puncture was not done.  She was started on vancomycin, ceftriaxone and ampicillin.   She was seen by neurologist and acyclovir was added.  She had an EEG which was abnormal with slow complex but did not show any epileptiform discharges.  Patient had another temperature of 104.7 on August 7 at 8 AM and since then she has not had abnormal temperature.  Patient remained in ICU.  Her creatinine which was normal on admission at 1.05 peaked up to 2.02 on 04/23/2018 and is back down to 1.04 today.  The WBC on admission was 16.3 and it increased up to 28.8 on 04/20/2018 and today it is 13.2.  Her hemoglobin on admission was 13.5 and it dropped to 8.8 on April 24, 2018 and then on August 13 it was 4.9 for which she received blood transfusion and today it is 8.8.  There was no cause for the acute blood loss.  She has been extubated and  has been transferred to the floor .  She is awake and knows that she is in the hospital but is unable to give any other history.  She says her legs have been hurting her.   OBJECTIVE:BP 125/83 (BP Location: Right Wrist)   Pulse 63   Temp 98 F (36.7 C) (Axillary)   Resp 18   Ht 5\' 5"  (1.651 m)   Wt 81.5 kg   SpO2 100%   BMI 29.90 kg/m  No fever in the past 15 days  Physical Exam Constitutional somnolent- on shaking her  She says "what"  Head.  Normocephalic.  No neck rigidity. CVS: S1-S2, no murmur, edema legs RS: Bilateral air entry decreased in the bases. GI: Soft, bowel sounds heard, no tenderness GU: Foley out Rt central line neck. MSK: She moves her limbs SKIN: No rash Neurologic: moves limbs   Lab Results CBC Latest Ref Rng & Units 05/04/2018 05/03/2018 05/02/2018  WBC 3.6 - 11.0 K/uL 8.0 6.4 8.4  Hemoglobin 12.0 - 16.0 g/dL 1.6(X) 6.9(L) 8.5(L)  Hematocrit 35.0 - 47.0 % 28.4(L) 20.9(L) 26.0(L)  Platelets 150 - 440 K/uL 170 130(L) 146(L)    BMP Latest Ref Rng & Units 05/05/2018 05/04/2018 05/03/2018  Glucose 70 - 99 mg/dL 096(E) 454(U) 981(X)  BUN 8 - 23 mg/dL 20 22 19   Creatinine 0.44 - 1.00 mg/dL 9.14 7.82 9.56  Sodium 135 - 145  mmol/L 137 136 138  Potassium 3.5 - 5.1 mmol/L 4.1 3.8 3.6  Chloride 98 - 111 mmol/L 99 97(L) 99  CO2 22 - 32 mmol/L 34(H) 31 33(H)  Calcium 8.9 - 10.3 mg/dL 10.5(H) 10.0 9.4   LP Wbc 2 ( they have given a differential on this - .     HSV DNA neg CSF culture Neg   Microbiology: [facility] 8/6 NG UC- NG   Radiographs and labs were personally reviewed by me.   Assessment and Plan 82 year-old female with a history of diabetes mellitus, hypertension, vascular dementia who lives on her own presents to the hospital after being found unresponsive by family members.  Had fever in the ED of 102.8  which increased to 104.  Patient apparently was normal and at baseline 2 days before admission and on the night before the admission when the daughter had called she was not sounding right on the phone.  Negative neuro imaging.  Lumbar puncture could not be done because of Eliquis has been treated empirically as bacterial meningitis since admission and also being treated as herpes encephalitis.  On admission her WBC count was 16 which are increased to 28.  Also creatinine which was normal on admission had increased during the hospitalization.  During the hospitalization she had an unexplained drop in hemoglobin to 4.9 and required blood transfusion.   Fluctuating neurological status- delirium- this is not infection- more related to underlying dementia and delirium had LP on day 15- no evidence of infection She completed 15 days of treatment for bacterial meningitis. Acyclovir being given empirically for HSV encephalitis X 18 ( Neg MRI No temporl lobe enhancement, Neg EEG -no PLEDS. Neg HSV DNA, ) will stop acyclovir  Fever with SIRS( sepsis) and unresponsiveness on admission.No fever since 24 hrs after hospitalization    There is no evidence of urinary tract infection or bacteremia or pneumonia on admission.  Without a lumbar puncture and not had assessed the patient on admission unable to comment  whether the patient really had bacterial meningitis or herpes encephalitis.  However Her acute presentation without any prodromal features of fever or headache in the preceding day is somewhat uncommon for  bacterial meningitis and herpes encephalitis.   With normal MRI showing no evidence of temporal lobe enhancement , EEG not showing any PLEDs herpes encephalitis is unlikely. But the MRI was done without contrast and hence is not sensitive. The fever responded pretty quickly within 24 hours which is unusual for an infection.   She has been empirically treated with antibiotics for  abcterial meningitis and encephalitis ( HSV)  AKI: Resolved.    Anemia -acute loss - had PRBC. Again dropped it- ? etiology  Diabetes mellitus on insulin  Hypertension on losartan     Discussed the management with her hospitalist and her nurse ID will sign off- call if needed  Lynn ItoJAYASHREE Zetta Stoneman, MD

## 2018-05-05 NOTE — Consult Note (Signed)
Pharmacy Antibiotic Note  Natalie Knight is a 82 y.o. female admitted on 04/18/2018 with meningitis.  Pharmacy has been consulted for Acyclovir dosing.  Plan: D5 Acyclovir (D 16 total) Continue Acyclovir 700 mg q8h for worsened mental status. Used adjusted body weight of 69.1 kg.   Will continue to monitor.  Height: 5\' 5"  (165.1 cm) Weight: 179 lb 10.8 oz (81.5 kg) IBW/kg (Calculated) : 57  Temp (24hrs), Avg:97.9 F (36.6 C), Min:97.4 F (36.3 C), Max:98.7 F (37.1 C)  Recent Labs  Lab 05/01/18 0447 05/01/18 0448 05/02/18 0500 05/03/18 0354 05/04/18 0620 05/05/18 0520  WBC  --  10.2 8.4 6.4 8.0  --   CREATININE 0.74  --  0.82 0.85 0.86 0.81    Estimated Creatinine Clearance: 59.4 mL/min (by C-G formula based on SCr of 0.81 mg/dL).    No Known Allergies  Antimicrobials this admission: Ceftriaxone 0806 >> 0819 Ampicillin 0806 >> 0819>>0820 (per ID) Acyclovir 0806>> 0816 --- 0819>> Vancomycin 0806>>0815   Microbiology results: 8/6 BCx: NG  8/6 UCx: NG  8/6 MRSA PCR: negative  0820 LP not indicative of meningitis, waiting for HSV panel -- NGTD 0820 fungal cx NGTD  Thank you for allowing pharmacy to be a part of this patient's care.   Mauri ReadingSavanna M Martin, PharmD Pharmacy Resident  05/05/2018 7:03 AM

## 2018-05-06 DIAGNOSIS — K5900 Constipation, unspecified: Secondary | ICD-10-CM | POA: Diagnosis not present

## 2018-05-06 DIAGNOSIS — R4182 Altered mental status, unspecified: Secondary | ICD-10-CM | POA: Diagnosis not present

## 2018-05-06 DIAGNOSIS — E119 Type 2 diabetes mellitus without complications: Secondary | ICD-10-CM | POA: Diagnosis not present

## 2018-05-06 DIAGNOSIS — F419 Anxiety disorder, unspecified: Secondary | ICD-10-CM | POA: Diagnosis not present

## 2018-05-06 DIAGNOSIS — I110 Hypertensive heart disease with heart failure: Secondary | ICD-10-CM | POA: Diagnosis not present

## 2018-05-06 DIAGNOSIS — Z7401 Bed confinement status: Secondary | ICD-10-CM | POA: Diagnosis not present

## 2018-05-06 DIAGNOSIS — R1312 Dysphagia, oropharyngeal phase: Secondary | ICD-10-CM | POA: Diagnosis not present

## 2018-05-06 DIAGNOSIS — M6281 Muscle weakness (generalized): Secondary | ICD-10-CM | POA: Diagnosis not present

## 2018-05-06 DIAGNOSIS — I509 Heart failure, unspecified: Secondary | ICD-10-CM | POA: Diagnosis not present

## 2018-05-06 DIAGNOSIS — I10 Essential (primary) hypertension: Secondary | ICD-10-CM | POA: Diagnosis not present

## 2018-05-06 DIAGNOSIS — J9690 Respiratory failure, unspecified, unspecified whether with hypoxia or hypercapnia: Secondary | ICD-10-CM | POA: Diagnosis not present

## 2018-05-06 DIAGNOSIS — D649 Anemia, unspecified: Secondary | ICD-10-CM | POA: Diagnosis not present

## 2018-05-06 DIAGNOSIS — D62 Acute posthemorrhagic anemia: Secondary | ICD-10-CM | POA: Diagnosis not present

## 2018-05-06 DIAGNOSIS — R52 Pain, unspecified: Secondary | ICD-10-CM | POA: Diagnosis not present

## 2018-05-06 DIAGNOSIS — N179 Acute kidney failure, unspecified: Secondary | ICD-10-CM | POA: Diagnosis not present

## 2018-05-06 DIAGNOSIS — F418 Other specified anxiety disorders: Secondary | ICD-10-CM | POA: Diagnosis not present

## 2018-05-06 DIAGNOSIS — J9600 Acute respiratory failure, unspecified whether with hypoxia or hypercapnia: Secondary | ICD-10-CM | POA: Diagnosis not present

## 2018-05-06 DIAGNOSIS — R41841 Cognitive communication deficit: Secondary | ICD-10-CM | POA: Diagnosis not present

## 2018-05-06 DIAGNOSIS — G039 Meningitis, unspecified: Secondary | ICD-10-CM | POA: Diagnosis not present

## 2018-05-06 DIAGNOSIS — G40901 Epilepsy, unspecified, not intractable, with status epilepticus: Secondary | ICD-10-CM | POA: Diagnosis not present

## 2018-05-06 DIAGNOSIS — R410 Disorientation, unspecified: Secondary | ICD-10-CM | POA: Diagnosis not present

## 2018-05-06 DIAGNOSIS — A419 Sepsis, unspecified organism: Secondary | ICD-10-CM | POA: Diagnosis not present

## 2018-05-06 DIAGNOSIS — G9341 Metabolic encephalopathy: Secondary | ICD-10-CM | POA: Diagnosis not present

## 2018-05-06 DIAGNOSIS — E569 Vitamin deficiency, unspecified: Secondary | ICD-10-CM | POA: Diagnosis not present

## 2018-05-06 DIAGNOSIS — G9340 Encephalopathy, unspecified: Secondary | ICD-10-CM | POA: Diagnosis not present

## 2018-05-06 LAB — GLUCOSE, CAPILLARY
GLUCOSE-CAPILLARY: 157 mg/dL — AB (ref 70–99)
Glucose-Capillary: 124 mg/dL — ABNORMAL HIGH (ref 70–99)

## 2018-05-06 LAB — TSH: TSH: 2.99 u[IU]/mL (ref 0.350–4.500)

## 2018-05-06 LAB — BASIC METABOLIC PANEL
Anion gap: 5 (ref 5–15)
BUN: 19 mg/dL (ref 8–23)
CALCIUM: 10.9 mg/dL — AB (ref 8.9–10.3)
CHLORIDE: 102 mmol/L (ref 98–111)
CO2: 31 mmol/L (ref 22–32)
CREATININE: 0.83 mg/dL (ref 0.44–1.00)
GFR calc non Af Amer: >60 mL/min (ref >60)
Glucose, Bld: 122 mg/dL — ABNORMAL HIGH (ref 70–99)
Potassium: 3.8 mmol/L (ref 3.5–5.1)
Sodium: 138 mmol/L (ref 135–145)

## 2018-05-06 LAB — HEMOGLOBIN: Hemoglobin: 10.3 g/dL — ABNORMAL LOW (ref 12.0–16.0)

## 2018-05-06 LAB — MAGNESIUM: Magnesium: 1.5 mg/dL — ABNORMAL LOW (ref 1.7–2.4)

## 2018-05-06 MED ORDER — FUROSEMIDE 40 MG PO TABS: 40.0000 mg | ORAL_TABLET | Freq: Every day | ORAL | 2 refills | Status: DC

## 2018-05-06 MED ORDER — METOPROLOL TARTRATE 25 MG PO TABS: 25.0000 mg | ORAL_TABLET | Freq: Two times a day (BID) | ORAL | 1 refills | Status: DC

## 2018-05-06 MED ORDER — MAGNESIUM SULFATE 4 GM/100ML IV SOLN
4.0000 g | Freq: Once | INTRAVENOUS | Status: AC
  Administered 2018-05-06: 4 g via INTRAVENOUS
  Filled 2018-05-06: qty 100

## 2018-05-06 MED ORDER — POTASSIUM CHLORIDE CRYS ER 20 MEQ PO TBCR: 40.0000 meq | EXTENDED_RELEASE_TABLET | Freq: Every day | ORAL | 0 refills | Status: DC

## 2018-05-06 MED ORDER — POTASSIUM CHLORIDE CRYS ER 20 MEQ PO TBCR: 40.0000 meq | EXTENDED_RELEASE_TABLET | Freq: Two times a day (BID) | ORAL | Status: DC

## 2018-05-06 MED ORDER — MAGNESIUM OXIDE 400 (241.3 MG) MG PO TABS: 800.0000 mg | ORAL_TABLET | Freq: Two times a day (BID) | ORAL | 0 refills | Status: DC

## 2018-05-06 MED ORDER — TRAZODONE HCL 50 MG PO TABS: 50.0000 mg | ORAL_TABLET | Freq: Every day | ORAL | 0 refills | Status: DC

## 2018-05-06 MED ORDER — POTASSIUM CHLORIDE CRYS ER 20 MEQ PO TBCR
40.0000 meq | EXTENDED_RELEASE_TABLET | Freq: Every day | ORAL | Status: DC
  Administered 2018-05-06: 40 meq via ORAL
  Filled 2018-05-06: qty 2

## 2018-05-06 MED ORDER — LOSARTAN POTASSIUM 50 MG PO TABS: 50.0000 mg | ORAL_TABLET | Freq: Every day | ORAL | 1 refills | Status: DC

## 2018-05-06 MED ORDER — MAGNESIUM OXIDE 400 (241.3 MG) MG PO TABS: 800.0000 mg | ORAL_TABLET | Freq: Two times a day (BID) | ORAL | Status: DC

## 2018-05-06 MED ORDER — ENSURE ENLIVE PO LIQD: 237.0000 mL | Freq: Three times a day (TID) | ORAL | 12 refills | Status: DC

## 2018-05-06 MED ORDER — ACETAMINOPHEN 325 MG PO TABS: 650.0000 mg | ORAL_TABLET | Freq: Four times a day (QID) | ORAL | 0 refills | Status: DC | PRN

## 2018-05-06 NOTE — Progress Notes (Signed)
IJ removed to right side, catheter intact upon removal, occulssive/guaze drsg placed.

## 2018-05-06 NOTE — Discharge Summary (Signed)
[facility] [place] [facility]   PATIENT NAME: Natalie Knight    MR#:  [id number]  DATE OF BIRTH:  1936/01/31  DATE OF ADMISSION:  04/18/2018   ADMITTING PHYSICIAN: Bertrum SolMontell D Salary, MD  DATE OF DISCHARGE:  05/06/18  PRIMARY CARE PHYSICIAN: Barbette ReichmannHande, Vishwanath, MD   ADMISSION DIAGNOSIS:   Acute respiratory failure (HCC) [J96.00] Status epilepticus (HCC) [G40.901] Meningitis [G03.9] Sepsis, due to unspecified organism (HCC) [A41.9]  DISCHARGE DIAGNOSIS:   Active Problems:   Meningitis   Sepsis (HCC)   Anorexia   Goals of care, counseling/discussion   Palliative care by specialist   Bacterial meningitis   SECONDARY DIAGNOSIS:   Past Medical History:  Diagnosis Date  . Diabetes mellitus without complication (HCC)   . Hypertension     HOSPITAL COURSE:   82 year old female living at home by herself was brought in secondary to altered mental status and unresponsive episode.  1.  Acute encephalopathy-intubated for airway protection.  Infection was ruled out. -Appreciate neurology consult.  EEG with generalized slowing and MRI done with and without contrast without any acute findings. -Patient was treated empirically for bacterial meningitis and viral meningitis. -Finished all the antibiotics at this time.  Alert and oriented to self, still has some changes of anoxic encephalopathy.  2.  Sepsis-secondary to meningitis.  However lumbar puncture was not done immediately due to her being on Eliquis. -Treated empirically with antibiotics and stopped now -HSV PCR came negative, so antivirals have been discontinued.  3.  Acute blood loss anemia-on top of anemia of chronic disease.  Patient was on Eliquis on presentation due to history of DVT.  GI did not recommend any further work-up. -Received transfusion this admission and hemoglobin is stable at 10. -Outpatient follow-up with PCP recommended further  4.  Hypertension-continue metoprolol, losartan and  Lasix  5.  Chronic diastolic CHF-continue Lasix, potassium supplements.  On losartan and metoprolol  Physical therapy worked with the patient, recommending rehab.   DISCHARGE CONDITIONS:   Guarded  CONSULTS OBTAINED:   Treatment Team:  Kym Groomriadhosp, Neuro1, MD Thana Farreynolds, Leslie, MD Lynn Itoavishankar, Jayashree, MD Stanton Kidneyoledo, Teodoro K, MD Alford HighlandWieting, Richard, MD  DRUG ALLERGIES:   No Known Allergies DISCHARGE MEDICATIONS:   Allergies as of 05/06/2018   No Known Allergies     Medication List    STOP taking these medications   bisacodyl 5 MG EC tablet Commonly known as:  DULCOLAX   cetirizine 10 MG tablet Commonly known as:  ZYRTEC   docusate sodium 100 MG capsule Commonly known as:  COLACE   ELIQUIS 2.5 MG Tabs tablet Generic drug:  apixaban   gabapentin 100 MG capsule Commonly known as:  NEURONTIN   magnesium oxide 400 MG tablet Commonly known as:  MAG-OX   metFORMIN 500 MG tablet Commonly known as:  GLUCOPHAGE   NOVOLOG FLEXPEN 100 UNIT/ML FlexPen Generic drug:  insulin aspart   omeprazole 20 MG capsule Commonly known as:  PRILOSEC   rOPINIRole 1 MG tablet Commonly known as:  REQUIP     TAKE these medications   acetaminophen 325 MG tablet Commonly known as:  TYLENOL Take 2 tablets (650 mg total) by mouth every 6 (six) hours as needed for mild pain, moderate pain, fever or headache (headache).   escitalopram 20 MG tablet Commonly known as:  LEXAPRO Take 1 tablet by mouth daily.   feeding supplement (ENSURE ENLIVE) Liqd Take 237 mLs by mouth 3 (three) times daily between meals.   furosemide 40 MG tablet  Commonly known as:  LASIX Take 1 tablet (40 mg total) by mouth daily. Start taking on:  05/07/2018   losartan 50 MG tablet Commonly known as:  COZAAR Take 1 tablet (50 mg total) by mouth daily. Start taking on:  05/07/2018 What changed:    medication strength  how much to take   magnesium oxide 400 (241.3 Mg) MG tablet Commonly known as:   MAG-OX Take 2 tablets (800 mg total) by mouth 2 (two) times daily. Start taking on:  05/07/2018   metoprolol tartrate 25 MG tablet Commonly known as:  LOPRESSOR Take 1 tablet (25 mg total) by mouth 2 (two) times daily.   MULTI-VITAMINS Tabs Take 1 tablet by mouth daily.   omega-3 acid ethyl esters 1 g capsule Commonly known as:  LOVAZA Take 1 g by mouth 2 (two) times daily.   potassium chloride SA 20 MEQ tablet Commonly known as:  K-DUR,KLOR-CON Take 2 tablets (40 mEq total) by mouth daily. While on lasix Start taking on:  05/07/2018   traZODone 50 MG tablet Commonly known as:  DESYREL Take 1 tablet (50 mg total) by mouth at bedtime.        DISCHARGE INSTRUCTIONS:   1. PCP f/u in 1 week 2.  Palliative care follow-up at the rehab  DIET:   Diet recommended at Discharge:  Dysphagia level 3 (Mech Soft) w/ Thin liquids BY CUP - NO Straws. Pills in Puree for safer swallowing. Aspiration precautions and Feeding Support at meals.   ACTIVITY:   Activity as tolerated  OXYGEN:   Home Oxygen: No.  Oxygen Delivery: room air  DISCHARGE LOCATION:   nursing home   If you experience worsening of your admission symptoms, develop shortness of breath, life threatening emergency, suicidal or homicidal thoughts you must seek medical attention immediately by calling 911 or calling your MD immediately  if symptoms less severe.  You Must read complete instructions/literature along with all the possible adverse reactions/side effects for all the Medicines you take and that have been prescribed to you. Take any new Medicines after you have completely understood and accpet all the possible adverse reactions/side effects.   Please note  You were cared for by a hospitalist during your hospital stay. If you have any questions about your discharge medications or the care you received while you were in the hospital after you are discharged, you can call the unit and asked to speak with the  hospitalist on call if the hospitalist that took care of you is not available. Once you are discharged, your primary care physician will handle any further medical issues. Please note that NO REFILLS for any discharge medications will be authorized once you are discharged, as it is imperative that you return to your primary care physician (or establish a relationship with a primary care physician if you do not have one) for your aftercare needs so that they can reassess your need for medications and monitor your lab values.    On the day of Discharge:  VITAL SIGNS:   Blood pressure (!) 154/78, pulse 66, temperature 97.9 F (36.6 C), temperature source Axillary, resp. rate 18, height 5\' 5"  (1.651 m), weight 79.6 kg, SpO2 100 %.  PHYSICAL EXAMINATION:    GENERAL:  82 y.o.-year-old elderly patient lying in the bed with no acute distress.  EYES: Pupils equal, round, reactive to light and accommodation. No scleral icterus. Extraocular muscles intact.  HEENT: Head atraumatic, normocephalic. Oropharynx and nasopharynx clear.  NECK:  Supple, no jugular  venous distention. No thyroid enlargement, no tenderness.  LUNGS: Normal breath sounds bilaterally, no wheezing, rales,rhonchi or crepitation. No use of accessory muscles of respiration. Decreased bibasilar breath sounds CARDIOVASCULAR: S1, S2 normal. No murmurs, rubs, or gallops.  ABDOMEN: Soft, non-tender, non-distended. Bowel sounds present. No organomegaly or mass.  EXTREMITIES: No pedal edema, cyanosis, or clubbing.  NEUROLOGIC: Prefers to keep her eyes closed and sleep.  When aroused, able to answer her name and follows simple commands. PSYCHIATRIC: The patient is alert and oriented to self SKIN: No obvious rash, lesion, or ulcer.   DATA REVIEW:   CBC Recent Labs  Lab 05/04/18 0620 05/06/18 0435  WBC 8.0  --   HGB 9.6* 10.3*  HCT 28.4*  --   PLT 170  --     Chemistries  Recent Labs  Lab 05/06/18 0435  NA 138  K 3.8  CL 102   CO2 31  GLUCOSE 122*  BUN 19  CREATININE 0.83  CALCIUM 10.9*  MG 1.5*     Microbiology Results  Results for orders placed or performed during the hospital encounter of 04/18/18  Urine culture     Status: None   Collection Time: 04/18/18 12:08 PM  Result Value Ref Range Status   Specimen Description   Final    URINE, RANDOM Performed at [facility], [address] [place], [place] [address]    Special Requests   Final    NONE Performed at [facility], [address] [place], [place] [address]    Culture   Final    NO GROWTH Performed at [facility], [address], [place], [place] [address]    Report Status 04/19/2018 FINAL  Final  Blood Culture (routine x 2)     Status: None   Collection Time: 04/18/18  1:12 PM  Result Value Ref Range Status   Specimen Description BLOOD RIGHT HAND  Final   Special Requests   Final    BOTTLES DRAWN AEROBIC AND ANAEROBIC Blood Culture results may not be optimal due to an inadequate volume of blood received in culture bottles   Culture   Final    NO GROWTH 5 DAYS Performed at [facility], [address] [place], [place] [address]    Report Status 04/23/2018 FINAL  Final  Blood Culture (routine x 2)     Status: None   Collection Time: 04/18/18  1:20 PM  Result Value Ref Range Status   Specimen Description BLOOD RIGHT [facility]  Final   Special Requests   Final    BOTTLES DRAWN AEROBIC AND ANAEROBIC Blood Culture adequate volume   Culture   Final    NO GROWTH 5 DAYS Performed at [facility], [address] [place], [place] [address]    Report Status 04/23/2018 FINAL  Final  MRSA PCR Screening     Status: None   Collection Time: 04/18/18  5:19 PM  Result Value Ref Range Status   MRSA by PCR NEGATIVE NEGATIVE Final    Comment:        The GeneXpert MRSA Assay (FDA approved for NASAL specimens only), is one component of a comprehensive MRSA colonization surveillance program. It is  not intended to diagnose MRSA infection nor to guide or monitor treatment for MRSA infections. Performed at [facility], [address] [place], [place] [address]   CSF culture     Status: None   Collection Time: 05/02/18 10:00 AM  Result Value Ref Range  Status   Specimen Description   Final    CSF Performed at [facility], [address] [place], [place] [address]    Special Requests   Final    CSF Performed at [facility], [address] [place], [place] [address]    Gram Stain   Final    NO ORGANISMS SEEN FEW RED BLOOD CELLS RARE WBC SEEN Performed at [facility], [address], [place], [place] [address]    Culture   Final    NO GROWTH 3 DAYS Performed at [facility], [address] [place], [place] [address]    Report Status 05/05/2018 FINAL  Final  Culture, fungus without smear     Status: None (Preliminary result)   Collection Time: 05/02/18 10:00 AM  Result Value Ref Range Status   Specimen Description   Final    CSF Performed at [facility], [address] [place], [place] [address]    Special Requests   Final    NONE Performed at [facility], [address], [place], [place] [address]    Culture   Final    NO FUNGUS ISOLATED AFTER 3 DAYS Performed at [facility], [address] [place], [place] [address]    Report Status PENDING  Incomplete    RADIOLOGY:  No results found.   Management plans discussed with the patient, family and they are in agreement.  CODE STATUS:     Code Status Orders  (From admission, onward)         Start     Ordered   04/19/18 1156  Do not attempt resuscitation (DNR)  Continuous    Question Answer Comment  In the event of cardiac or respiratory ARREST Do not call a "code blue"   In the event of cardiac or respiratory ARREST Do not perform Intubation, CPR, defibrillation or ACLS   In the event of cardiac or respiratory ARREST Use  medication by any route, position, wound care, and other measures to relive pain and suffering. May use oxygen, suction and manual treatment of airway obstruction as needed for comfort.      04/19/18 1155        Code Status History    Date Active Date Inactive Code Status Order ID Comments User Context   04/18/2018 1541 04/19/2018 1155 Full Code [id number]  Salary, Evelena Asa, MD ED      TOTAL TIME TAKING CARE OF THIS PATIENT: 38 minutes.    Enid Baas M.D on 05/06/2018 at 11:08 AM  Between 7am to 6pm - Pager - [phone]  After 6pm go to [email or web address] - Social research officer, government  [facility] [place] [facility]  Office  [phone]  CC: Primary care physician; Barbette Reichmann, MD   Note: This dictation was prepared with Dragon dictation along with smaller phrase technology. Any transcriptional errors that result from this process are unintentional.

## 2018-05-06 NOTE — Progress Notes (Signed)
Called EMS for transport. 

## 2018-05-06 NOTE — Clinical Social Work Note (Signed)
The patient will discharge today to [facility] room 501 via non-emergent EMS. The patient's son and the facility are aware and in agreement. The patient's son has requested that the RN call him with EMS arrives to alert him to meet his mother at the facility. The CSW has delivered the discharge packet to the chart and has sent all updated discharge paperwork to the facility. The CSW is signing off. Please consult should needs arise.  Argentina PonderKaren Martha Zeyna Mkrtchyan, MSW, Theresia MajorsLCSWA [phone]

## 2018-05-06 NOTE — Progress Notes (Signed)
Pharmacy Electrolyte Monitoring Consult:  Pharmacy consulted to assist in monitoring and replacing electrolytes in this 82 y.o. female admitted on 04/18/2018 with Altered Mental Status  Labs:  Sodium (mmol/L)  Date Value  05/06/2018 138  10/03/2014 142   Potassium (mmol/L)  Date Value  05/06/2018 3.8  10/03/2014 3.6   Magnesium (mg/dL)  Date Value  16/10/960408/24/2019 1.5 (L)   Phosphorus (mg/dL)  Date Value  54/09/811908/20/2019 3.2   Calcium (mg/dL)  Date Value  14/78/295608/24/2019 10.9 (H)   Calcium, Total (mg/dL)  Date Value  21/30/865701/21/2016 9.3   Albumin (g/dL)  Date Value  84/69/629508/08/2018 2.7 (L)  10/01/2014 2.6 (L)    Assessment/Plan: 0824 K 3.8          Mg 1.5  Receiving lasix 40 mg PO daily I will give Mg 4g IV once today. Will resume MagOx 800mg  po BID daily tomorrow. Continue KCL 40 MEQ daily.   Will continue to follow daily labs due to possible refeeding risk.    Natalie Knight, Pharm.D, BCPS Clinical Pharmacist 05/06/2018 7:19 AM

## 2018-05-06 NOTE — Progress Notes (Signed)
Called report to [place] at [facility]. Transfer packet reviewed. Answered all questions

## 2018-05-10 DIAGNOSIS — A419 Sepsis, unspecified organism: Secondary | ICD-10-CM | POA: Diagnosis not present

## 2018-05-10 DIAGNOSIS — F419 Anxiety disorder, unspecified: Secondary | ICD-10-CM | POA: Diagnosis not present

## 2018-05-10 DIAGNOSIS — E119 Type 2 diabetes mellitus without complications: Secondary | ICD-10-CM | POA: Diagnosis not present

## 2018-05-10 DIAGNOSIS — I110 Hypertensive heart disease with heart failure: Secondary | ICD-10-CM | POA: Diagnosis not present

## 2018-05-15 ENCOUNTER — Other Ambulatory Visit
Admission: RE | Admit: 2018-05-15 | Discharge: 2018-05-15 | Disposition: A | Discharge status: Home / Self Care | Payer: Medicare HMO | Source: Skilled Nursing Facility | Attending: Family Medicine | Admitting: Family Medicine

## 2018-05-15 DIAGNOSIS — N179 Acute kidney failure, unspecified: Secondary | ICD-10-CM | POA: Insufficient documentation

## 2018-05-15 LAB — BRAIN NATRIURETIC PEPTIDE: B Natriuretic Peptide: 212 pg/mL — ABNORMAL HIGH (ref 0.0–100.0)

## 2018-05-17 DIAGNOSIS — G9340 Encephalopathy, unspecified: Secondary | ICD-10-CM | POA: Diagnosis not present

## 2018-05-17 DIAGNOSIS — E119 Type 2 diabetes mellitus without complications: Secondary | ICD-10-CM | POA: Diagnosis not present

## 2018-05-17 DIAGNOSIS — F418 Other specified anxiety disorders: Secondary | ICD-10-CM | POA: Diagnosis not present

## 2018-05-17 DIAGNOSIS — D649 Anemia, unspecified: Secondary | ICD-10-CM | POA: Diagnosis not present

## 2018-05-17 DIAGNOSIS — I110 Hypertensive heart disease with heart failure: Secondary | ICD-10-CM | POA: Diagnosis not present

## 2018-05-23 LAB — CULTURE, FUNGUS WITHOUT SMEAR

## 2018-05-24 DIAGNOSIS — F418 Other specified anxiety disorders: Secondary | ICD-10-CM | POA: Diagnosis not present

## 2018-05-24 DIAGNOSIS — E119 Type 2 diabetes mellitus without complications: Secondary | ICD-10-CM | POA: Diagnosis not present

## 2018-05-24 DIAGNOSIS — A419 Sepsis, unspecified organism: Secondary | ICD-10-CM | POA: Diagnosis not present

## 2018-05-24 DIAGNOSIS — I110 Hypertensive heart disease with heart failure: Secondary | ICD-10-CM | POA: Diagnosis not present

## 2018-06-01 DIAGNOSIS — F418 Other specified anxiety disorders: Secondary | ICD-10-CM | POA: Diagnosis not present

## 2018-06-01 DIAGNOSIS — E119 Type 2 diabetes mellitus without complications: Secondary | ICD-10-CM | POA: Diagnosis not present

## 2018-06-01 DIAGNOSIS — I110 Hypertensive heart disease with heart failure: Secondary | ICD-10-CM | POA: Diagnosis not present

## 2018-06-01 DIAGNOSIS — D649 Anemia, unspecified: Secondary | ICD-10-CM | POA: Diagnosis not present

## 2018-06-01 DIAGNOSIS — G9340 Encephalopathy, unspecified: Secondary | ICD-10-CM | POA: Diagnosis not present

## 2018-06-14 DIAGNOSIS — I110 Hypertensive heart disease with heart failure: Secondary | ICD-10-CM | POA: Diagnosis not present

## 2018-06-14 DIAGNOSIS — F418 Other specified anxiety disorders: Secondary | ICD-10-CM | POA: Diagnosis not present

## 2018-06-14 DIAGNOSIS — K5900 Constipation, unspecified: Secondary | ICD-10-CM | POA: Diagnosis not present

## 2018-06-14 DIAGNOSIS — A419 Sepsis, unspecified organism: Secondary | ICD-10-CM | POA: Diagnosis not present

## 2018-06-14 DIAGNOSIS — E119 Type 2 diabetes mellitus without complications: Secondary | ICD-10-CM | POA: Diagnosis not present

## 2018-06-20 DIAGNOSIS — R350 Frequency of micturition: Secondary | ICD-10-CM | POA: Diagnosis not present

## 2018-06-20 DIAGNOSIS — I110 Hypertensive heart disease with heart failure: Secondary | ICD-10-CM | POA: Diagnosis not present

## 2018-06-20 DIAGNOSIS — E119 Type 2 diabetes mellitus without complications: Secondary | ICD-10-CM | POA: Diagnosis not present

## 2018-06-20 DIAGNOSIS — G40901 Epilepsy, unspecified, not intractable, with status epilepticus: Secondary | ICD-10-CM | POA: Diagnosis not present

## 2018-06-20 DIAGNOSIS — I5032 Chronic diastolic (congestive) heart failure: Secondary | ICD-10-CM | POA: Diagnosis not present

## 2018-06-20 DIAGNOSIS — G009 Bacterial meningitis, unspecified: Secondary | ICD-10-CM | POA: Diagnosis not present

## 2018-06-20 DIAGNOSIS — D649 Anemia, unspecified: Secondary | ICD-10-CM | POA: Diagnosis not present

## 2018-06-20 DIAGNOSIS — G931 Anoxic brain damage, not elsewhere classified: Secondary | ICD-10-CM | POA: Diagnosis not present

## 2018-06-20 DIAGNOSIS — F17220 Nicotine dependence, chewing tobacco, uncomplicated: Secondary | ICD-10-CM | POA: Diagnosis not present

## 2018-06-21 ENCOUNTER — Other Ambulatory Visit: Payer: Self-pay

## 2018-06-21 DIAGNOSIS — F17220 Nicotine dependence, chewing tobacco, uncomplicated: Secondary | ICD-10-CM | POA: Diagnosis not present

## 2018-06-21 DIAGNOSIS — G931 Anoxic brain damage, not elsewhere classified: Secondary | ICD-10-CM | POA: Diagnosis not present

## 2018-06-21 DIAGNOSIS — D649 Anemia, unspecified: Secondary | ICD-10-CM | POA: Diagnosis not present

## 2018-06-21 DIAGNOSIS — G009 Bacterial meningitis, unspecified: Secondary | ICD-10-CM | POA: Diagnosis not present

## 2018-06-21 DIAGNOSIS — R350 Frequency of micturition: Secondary | ICD-10-CM | POA: Diagnosis not present

## 2018-06-21 DIAGNOSIS — I110 Hypertensive heart disease with heart failure: Secondary | ICD-10-CM | POA: Diagnosis not present

## 2018-06-21 DIAGNOSIS — E119 Type 2 diabetes mellitus without complications: Secondary | ICD-10-CM | POA: Diagnosis not present

## 2018-06-21 DIAGNOSIS — G40901 Epilepsy, unspecified, not intractable, with status epilepticus: Secondary | ICD-10-CM | POA: Diagnosis not present

## 2018-06-21 DIAGNOSIS — I5032 Chronic diastolic (congestive) heart failure: Secondary | ICD-10-CM | POA: Diagnosis not present

## 2018-06-21 NOTE — Patient Outreach (Signed)
[facility]) Care Management  06/21/2018  Natalie Knight 06/21/1936 [id number]  EMMI: general discharge red alert Referral date: 06/21/18 Referral reason: Got discharge papers: I don't know,   Know who to call about changes in condition: No,  Scheduled follow up: no Insurance: [employer] Day # 1  Telephone call to patient regarding EMMI general discharge red alert. HIPAA verified with patient. Patient gave permission to speak with her son, Natalie Knight regarding her health information. Explained reason for call. Son states patient is doing about the same but better.  He states patient was discharged from the skilled nursing rehab center on 06/19/18.  Son state patient received discharge paperwork when she was released from the rehab facility.  Son states patient is scheduled to follow up with her primary MD on 07/06/18.  He states patient has transportation to her appointments. Son states [facility] is providing nursing care and physical therapy.   Son states patient has her medications and takes them as prescribed. Son states he makes sure patient is taking her medication correctly. Son states he/ patient know who to call for changes in patient's condition.  Son denies patient having any new symptoms or concerns.  Denies need for [name] care management services at this time. Son verbally agreed to [name] mailing out [name] care management brochure/ magnet to patient. Son states [facility] also gave patient contact phone number for 24 hour nurse line number  RNCM advised patient to notify MD of any changes in condition prior to scheduled appointment. RNCM verified patient aware of 911 services for urgent/ emergent needs.  PLAN: RNCM will close patient due to patient being assessed and having no further needs per son.  RNCM will send closure notification to patients primary MD.   Natalie Ina RN,BSN,CCM [facility] Telephonic  [phone]

## 2018-06-22 DIAGNOSIS — D649 Anemia, unspecified: Secondary | ICD-10-CM | POA: Diagnosis not present

## 2018-06-22 DIAGNOSIS — F17220 Nicotine dependence, chewing tobacco, uncomplicated: Secondary | ICD-10-CM | POA: Diagnosis not present

## 2018-06-22 DIAGNOSIS — G931 Anoxic brain damage, not elsewhere classified: Secondary | ICD-10-CM | POA: Diagnosis not present

## 2018-06-22 DIAGNOSIS — G009 Bacterial meningitis, unspecified: Secondary | ICD-10-CM | POA: Diagnosis not present

## 2018-06-22 DIAGNOSIS — I5032 Chronic diastolic (congestive) heart failure: Secondary | ICD-10-CM | POA: Diagnosis not present

## 2018-06-22 DIAGNOSIS — I110 Hypertensive heart disease with heart failure: Secondary | ICD-10-CM | POA: Diagnosis not present

## 2018-06-22 DIAGNOSIS — G40901 Epilepsy, unspecified, not intractable, with status epilepticus: Secondary | ICD-10-CM | POA: Diagnosis not present

## 2018-06-22 DIAGNOSIS — E119 Type 2 diabetes mellitus without complications: Secondary | ICD-10-CM | POA: Diagnosis not present

## 2018-06-22 DIAGNOSIS — R350 Frequency of micturition: Secondary | ICD-10-CM | POA: Diagnosis not present

## 2018-06-23 DIAGNOSIS — G40901 Epilepsy, unspecified, not intractable, with status epilepticus: Secondary | ICD-10-CM | POA: Diagnosis not present

## 2018-06-23 DIAGNOSIS — G931 Anoxic brain damage, not elsewhere classified: Secondary | ICD-10-CM | POA: Diagnosis not present

## 2018-06-23 DIAGNOSIS — I110 Hypertensive heart disease with heart failure: Secondary | ICD-10-CM | POA: Diagnosis not present

## 2018-06-23 DIAGNOSIS — F17220 Nicotine dependence, chewing tobacco, uncomplicated: Secondary | ICD-10-CM | POA: Diagnosis not present

## 2018-06-23 DIAGNOSIS — D649 Anemia, unspecified: Secondary | ICD-10-CM | POA: Diagnosis not present

## 2018-06-23 DIAGNOSIS — G009 Bacterial meningitis, unspecified: Secondary | ICD-10-CM | POA: Diagnosis not present

## 2018-06-23 DIAGNOSIS — R350 Frequency of micturition: Secondary | ICD-10-CM | POA: Diagnosis not present

## 2018-06-23 DIAGNOSIS — I5032 Chronic diastolic (congestive) heart failure: Secondary | ICD-10-CM | POA: Diagnosis not present

## 2018-06-23 DIAGNOSIS — E119 Type 2 diabetes mellitus without complications: Secondary | ICD-10-CM | POA: Diagnosis not present

## 2018-06-26 DIAGNOSIS — F17220 Nicotine dependence, chewing tobacco, uncomplicated: Secondary | ICD-10-CM | POA: Diagnosis not present

## 2018-06-26 DIAGNOSIS — G40901 Epilepsy, unspecified, not intractable, with status epilepticus: Secondary | ICD-10-CM | POA: Diagnosis not present

## 2018-06-26 DIAGNOSIS — I110 Hypertensive heart disease with heart failure: Secondary | ICD-10-CM | POA: Diagnosis not present

## 2018-06-26 DIAGNOSIS — R350 Frequency of micturition: Secondary | ICD-10-CM | POA: Diagnosis not present

## 2018-06-26 DIAGNOSIS — D649 Anemia, unspecified: Secondary | ICD-10-CM | POA: Diagnosis not present

## 2018-06-26 DIAGNOSIS — G931 Anoxic brain damage, not elsewhere classified: Secondary | ICD-10-CM | POA: Diagnosis not present

## 2018-06-26 DIAGNOSIS — E119 Type 2 diabetes mellitus without complications: Secondary | ICD-10-CM | POA: Diagnosis not present

## 2018-06-26 DIAGNOSIS — I5032 Chronic diastolic (congestive) heart failure: Secondary | ICD-10-CM | POA: Diagnosis not present

## 2018-06-26 DIAGNOSIS — G009 Bacterial meningitis, unspecified: Secondary | ICD-10-CM | POA: Diagnosis not present

## 2018-06-27 DIAGNOSIS — G931 Anoxic brain damage, not elsewhere classified: Secondary | ICD-10-CM | POA: Diagnosis not present

## 2018-06-27 DIAGNOSIS — R350 Frequency of micturition: Secondary | ICD-10-CM | POA: Diagnosis not present

## 2018-06-27 DIAGNOSIS — D649 Anemia, unspecified: Secondary | ICD-10-CM | POA: Diagnosis not present

## 2018-06-27 DIAGNOSIS — F17220 Nicotine dependence, chewing tobacco, uncomplicated: Secondary | ICD-10-CM | POA: Diagnosis not present

## 2018-06-27 DIAGNOSIS — I5032 Chronic diastolic (congestive) heart failure: Secondary | ICD-10-CM | POA: Diagnosis not present

## 2018-06-27 DIAGNOSIS — I110 Hypertensive heart disease with heart failure: Secondary | ICD-10-CM | POA: Diagnosis not present

## 2018-06-27 DIAGNOSIS — G009 Bacterial meningitis, unspecified: Secondary | ICD-10-CM | POA: Diagnosis not present

## 2018-06-27 DIAGNOSIS — E119 Type 2 diabetes mellitus without complications: Secondary | ICD-10-CM | POA: Diagnosis not present

## 2018-06-27 DIAGNOSIS — G40901 Epilepsy, unspecified, not intractable, with status epilepticus: Secondary | ICD-10-CM | POA: Diagnosis not present

## 2018-06-28 DIAGNOSIS — D649 Anemia, unspecified: Secondary | ICD-10-CM | POA: Diagnosis not present

## 2018-06-28 DIAGNOSIS — I110 Hypertensive heart disease with heart failure: Secondary | ICD-10-CM | POA: Diagnosis not present

## 2018-06-28 DIAGNOSIS — F17220 Nicotine dependence, chewing tobacco, uncomplicated: Secondary | ICD-10-CM | POA: Diagnosis not present

## 2018-06-28 DIAGNOSIS — I5032 Chronic diastolic (congestive) heart failure: Secondary | ICD-10-CM | POA: Diagnosis not present

## 2018-06-28 DIAGNOSIS — R350 Frequency of micturition: Secondary | ICD-10-CM | POA: Diagnosis not present

## 2018-06-28 DIAGNOSIS — G40901 Epilepsy, unspecified, not intractable, with status epilepticus: Secondary | ICD-10-CM | POA: Diagnosis not present

## 2018-06-28 DIAGNOSIS — G931 Anoxic brain damage, not elsewhere classified: Secondary | ICD-10-CM | POA: Diagnosis not present

## 2018-06-28 DIAGNOSIS — G009 Bacterial meningitis, unspecified: Secondary | ICD-10-CM | POA: Diagnosis not present

## 2018-06-28 DIAGNOSIS — E119 Type 2 diabetes mellitus without complications: Secondary | ICD-10-CM | POA: Diagnosis not present

## 2018-06-29 DIAGNOSIS — D649 Anemia, unspecified: Secondary | ICD-10-CM | POA: Diagnosis not present

## 2018-06-29 DIAGNOSIS — I110 Hypertensive heart disease with heart failure: Secondary | ICD-10-CM | POA: Diagnosis not present

## 2018-06-29 DIAGNOSIS — R350 Frequency of micturition: Secondary | ICD-10-CM | POA: Diagnosis not present

## 2018-06-29 DIAGNOSIS — G40901 Epilepsy, unspecified, not intractable, with status epilepticus: Secondary | ICD-10-CM | POA: Diagnosis not present

## 2018-06-29 DIAGNOSIS — F17220 Nicotine dependence, chewing tobacco, uncomplicated: Secondary | ICD-10-CM | POA: Diagnosis not present

## 2018-06-29 DIAGNOSIS — G931 Anoxic brain damage, not elsewhere classified: Secondary | ICD-10-CM | POA: Diagnosis not present

## 2018-06-29 DIAGNOSIS — E119 Type 2 diabetes mellitus without complications: Secondary | ICD-10-CM | POA: Diagnosis not present

## 2018-06-29 DIAGNOSIS — G009 Bacterial meningitis, unspecified: Secondary | ICD-10-CM | POA: Diagnosis not present

## 2018-06-29 DIAGNOSIS — I5032 Chronic diastolic (congestive) heart failure: Secondary | ICD-10-CM | POA: Diagnosis not present

## 2018-06-30 DIAGNOSIS — I5032 Chronic diastolic (congestive) heart failure: Secondary | ICD-10-CM | POA: Diagnosis not present

## 2018-06-30 DIAGNOSIS — R350 Frequency of micturition: Secondary | ICD-10-CM | POA: Diagnosis not present

## 2018-06-30 DIAGNOSIS — E119 Type 2 diabetes mellitus without complications: Secondary | ICD-10-CM | POA: Diagnosis not present

## 2018-06-30 DIAGNOSIS — G009 Bacterial meningitis, unspecified: Secondary | ICD-10-CM | POA: Diagnosis not present

## 2018-06-30 DIAGNOSIS — G40901 Epilepsy, unspecified, not intractable, with status epilepticus: Secondary | ICD-10-CM | POA: Diagnosis not present

## 2018-06-30 DIAGNOSIS — I110 Hypertensive heart disease with heart failure: Secondary | ICD-10-CM | POA: Diagnosis not present

## 2018-06-30 DIAGNOSIS — M545 Low back pain: Secondary | ICD-10-CM | POA: Diagnosis not present

## 2018-06-30 DIAGNOSIS — F17220 Nicotine dependence, chewing tobacco, uncomplicated: Secondary | ICD-10-CM | POA: Diagnosis not present

## 2018-06-30 DIAGNOSIS — G931 Anoxic brain damage, not elsewhere classified: Secondary | ICD-10-CM | POA: Diagnosis not present

## 2018-06-30 DIAGNOSIS — D649 Anemia, unspecified: Secondary | ICD-10-CM | POA: Diagnosis not present

## 2018-07-03 DIAGNOSIS — G009 Bacterial meningitis, unspecified: Secondary | ICD-10-CM | POA: Diagnosis not present

## 2018-07-03 DIAGNOSIS — G40901 Epilepsy, unspecified, not intractable, with status epilepticus: Secondary | ICD-10-CM | POA: Diagnosis not present

## 2018-07-03 DIAGNOSIS — D649 Anemia, unspecified: Secondary | ICD-10-CM | POA: Diagnosis not present

## 2018-07-03 DIAGNOSIS — I110 Hypertensive heart disease with heart failure: Secondary | ICD-10-CM | POA: Diagnosis not present

## 2018-07-03 DIAGNOSIS — F17220 Nicotine dependence, chewing tobacco, uncomplicated: Secondary | ICD-10-CM | POA: Diagnosis not present

## 2018-07-03 DIAGNOSIS — G931 Anoxic brain damage, not elsewhere classified: Secondary | ICD-10-CM | POA: Diagnosis not present

## 2018-07-03 DIAGNOSIS — I5032 Chronic diastolic (congestive) heart failure: Secondary | ICD-10-CM | POA: Diagnosis not present

## 2018-07-03 DIAGNOSIS — E119 Type 2 diabetes mellitus without complications: Secondary | ICD-10-CM | POA: Diagnosis not present

## 2018-07-03 DIAGNOSIS — R350 Frequency of micturition: Secondary | ICD-10-CM | POA: Diagnosis not present

## 2018-07-04 DIAGNOSIS — F17220 Nicotine dependence, chewing tobacco, uncomplicated: Secondary | ICD-10-CM | POA: Diagnosis not present

## 2018-07-04 DIAGNOSIS — I110 Hypertensive heart disease with heart failure: Secondary | ICD-10-CM | POA: Diagnosis not present

## 2018-07-04 DIAGNOSIS — I5032 Chronic diastolic (congestive) heart failure: Secondary | ICD-10-CM | POA: Diagnosis not present

## 2018-07-04 DIAGNOSIS — D649 Anemia, unspecified: Secondary | ICD-10-CM | POA: Diagnosis not present

## 2018-07-04 DIAGNOSIS — E119 Type 2 diabetes mellitus without complications: Secondary | ICD-10-CM | POA: Diagnosis not present

## 2018-07-04 DIAGNOSIS — R350 Frequency of micturition: Secondary | ICD-10-CM | POA: Diagnosis not present

## 2018-07-04 DIAGNOSIS — G009 Bacterial meningitis, unspecified: Secondary | ICD-10-CM | POA: Diagnosis not present

## 2018-07-04 DIAGNOSIS — G40901 Epilepsy, unspecified, not intractable, with status epilepticus: Secondary | ICD-10-CM | POA: Diagnosis not present

## 2018-07-04 DIAGNOSIS — G931 Anoxic brain damage, not elsewhere classified: Secondary | ICD-10-CM | POA: Diagnosis not present

## 2018-07-05 DIAGNOSIS — I5032 Chronic diastolic (congestive) heart failure: Secondary | ICD-10-CM | POA: Diagnosis not present

## 2018-07-05 DIAGNOSIS — F17220 Nicotine dependence, chewing tobacco, uncomplicated: Secondary | ICD-10-CM | POA: Diagnosis not present

## 2018-07-05 DIAGNOSIS — R350 Frequency of micturition: Secondary | ICD-10-CM | POA: Diagnosis not present

## 2018-07-05 DIAGNOSIS — G40901 Epilepsy, unspecified, not intractable, with status epilepticus: Secondary | ICD-10-CM | POA: Diagnosis not present

## 2018-07-05 DIAGNOSIS — E119 Type 2 diabetes mellitus without complications: Secondary | ICD-10-CM | POA: Diagnosis not present

## 2018-07-05 DIAGNOSIS — G009 Bacterial meningitis, unspecified: Secondary | ICD-10-CM | POA: Diagnosis not present

## 2018-07-05 DIAGNOSIS — D649 Anemia, unspecified: Secondary | ICD-10-CM | POA: Diagnosis not present

## 2018-07-05 DIAGNOSIS — G931 Anoxic brain damage, not elsewhere classified: Secondary | ICD-10-CM | POA: Diagnosis not present

## 2018-07-05 DIAGNOSIS — I110 Hypertensive heart disease with heart failure: Secondary | ICD-10-CM | POA: Diagnosis not present

## 2018-07-06 DIAGNOSIS — I110 Hypertensive heart disease with heart failure: Secondary | ICD-10-CM | POA: Diagnosis not present

## 2018-07-06 DIAGNOSIS — R262 Difficulty in walking, not elsewhere classified: Secondary | ICD-10-CM | POA: Diagnosis not present

## 2018-07-06 DIAGNOSIS — G40901 Epilepsy, unspecified, not intractable, with status epilepticus: Secondary | ICD-10-CM | POA: Diagnosis not present

## 2018-07-06 DIAGNOSIS — F3341 Major depressive disorder, recurrent, in partial remission: Secondary | ICD-10-CM | POA: Diagnosis not present

## 2018-07-06 DIAGNOSIS — R4182 Altered mental status, unspecified: Secondary | ICD-10-CM | POA: Diagnosis not present

## 2018-07-06 DIAGNOSIS — G931 Anoxic brain damage, not elsewhere classified: Secondary | ICD-10-CM | POA: Diagnosis not present

## 2018-07-06 DIAGNOSIS — R32 Unspecified urinary incontinence: Secondary | ICD-10-CM | POA: Diagnosis not present

## 2018-07-06 DIAGNOSIS — F0390 Unspecified dementia without behavioral disturbance: Secondary | ICD-10-CM | POA: Diagnosis not present

## 2018-07-06 DIAGNOSIS — D649 Anemia, unspecified: Secondary | ICD-10-CM | POA: Diagnosis not present

## 2018-07-06 DIAGNOSIS — I10 Essential (primary) hypertension: Secondary | ICD-10-CM | POA: Diagnosis not present

## 2018-07-06 DIAGNOSIS — F17220 Nicotine dependence, chewing tobacco, uncomplicated: Secondary | ICD-10-CM | POA: Diagnosis not present

## 2018-07-06 DIAGNOSIS — R350 Frequency of micturition: Secondary | ICD-10-CM | POA: Diagnosis not present

## 2018-07-06 DIAGNOSIS — E119 Type 2 diabetes mellitus without complications: Secondary | ICD-10-CM | POA: Diagnosis not present

## 2018-07-06 DIAGNOSIS — I5032 Chronic diastolic (congestive) heart failure: Secondary | ICD-10-CM | POA: Diagnosis not present

## 2018-07-06 DIAGNOSIS — I69359 Hemiplegia and hemiparesis following cerebral infarction affecting unspecified side: Secondary | ICD-10-CM | POA: Diagnosis not present

## 2018-07-06 DIAGNOSIS — G009 Bacterial meningitis, unspecified: Secondary | ICD-10-CM | POA: Diagnosis not present

## 2018-07-06 DIAGNOSIS — Z23 Encounter for immunization: Secondary | ICD-10-CM | POA: Diagnosis not present

## 2018-07-07 DIAGNOSIS — F17220 Nicotine dependence, chewing tobacco, uncomplicated: Secondary | ICD-10-CM | POA: Diagnosis not present

## 2018-07-07 DIAGNOSIS — G40901 Epilepsy, unspecified, not intractable, with status epilepticus: Secondary | ICD-10-CM | POA: Diagnosis not present

## 2018-07-07 DIAGNOSIS — E119 Type 2 diabetes mellitus without complications: Secondary | ICD-10-CM | POA: Diagnosis not present

## 2018-07-07 DIAGNOSIS — G009 Bacterial meningitis, unspecified: Secondary | ICD-10-CM | POA: Diagnosis not present

## 2018-07-07 DIAGNOSIS — R350 Frequency of micturition: Secondary | ICD-10-CM | POA: Diagnosis not present

## 2018-07-07 DIAGNOSIS — D649 Anemia, unspecified: Secondary | ICD-10-CM | POA: Diagnosis not present

## 2018-07-07 DIAGNOSIS — I110 Hypertensive heart disease with heart failure: Secondary | ICD-10-CM | POA: Diagnosis not present

## 2018-07-07 DIAGNOSIS — I5032 Chronic diastolic (congestive) heart failure: Secondary | ICD-10-CM | POA: Diagnosis not present

## 2018-07-07 DIAGNOSIS — G931 Anoxic brain damage, not elsewhere classified: Secondary | ICD-10-CM | POA: Diagnosis not present

## 2018-07-10 DIAGNOSIS — I5032 Chronic diastolic (congestive) heart failure: Secondary | ICD-10-CM | POA: Diagnosis not present

## 2018-07-10 DIAGNOSIS — G40901 Epilepsy, unspecified, not intractable, with status epilepticus: Secondary | ICD-10-CM | POA: Diagnosis not present

## 2018-07-10 DIAGNOSIS — G009 Bacterial meningitis, unspecified: Secondary | ICD-10-CM | POA: Diagnosis not present

## 2018-07-10 DIAGNOSIS — G931 Anoxic brain damage, not elsewhere classified: Secondary | ICD-10-CM | POA: Diagnosis not present

## 2018-07-10 DIAGNOSIS — D649 Anemia, unspecified: Secondary | ICD-10-CM | POA: Diagnosis not present

## 2018-07-10 DIAGNOSIS — R350 Frequency of micturition: Secondary | ICD-10-CM | POA: Diagnosis not present

## 2018-07-10 DIAGNOSIS — I110 Hypertensive heart disease with heart failure: Secondary | ICD-10-CM | POA: Diagnosis not present

## 2018-07-10 DIAGNOSIS — F17220 Nicotine dependence, chewing tobacco, uncomplicated: Secondary | ICD-10-CM | POA: Diagnosis not present

## 2018-07-10 DIAGNOSIS — E119 Type 2 diabetes mellitus without complications: Secondary | ICD-10-CM | POA: Diagnosis not present

## 2018-07-11 DIAGNOSIS — I5032 Chronic diastolic (congestive) heart failure: Secondary | ICD-10-CM | POA: Diagnosis not present

## 2018-07-11 DIAGNOSIS — R350 Frequency of micturition: Secondary | ICD-10-CM | POA: Diagnosis not present

## 2018-07-11 DIAGNOSIS — G931 Anoxic brain damage, not elsewhere classified: Secondary | ICD-10-CM | POA: Diagnosis not present

## 2018-07-11 DIAGNOSIS — D649 Anemia, unspecified: Secondary | ICD-10-CM | POA: Diagnosis not present

## 2018-07-11 DIAGNOSIS — E119 Type 2 diabetes mellitus without complications: Secondary | ICD-10-CM | POA: Diagnosis not present

## 2018-07-11 DIAGNOSIS — I110 Hypertensive heart disease with heart failure: Secondary | ICD-10-CM | POA: Diagnosis not present

## 2018-07-11 DIAGNOSIS — G009 Bacterial meningitis, unspecified: Secondary | ICD-10-CM | POA: Diagnosis not present

## 2018-07-11 DIAGNOSIS — G40901 Epilepsy, unspecified, not intractable, with status epilepticus: Secondary | ICD-10-CM | POA: Diagnosis not present

## 2018-07-11 DIAGNOSIS — F17220 Nicotine dependence, chewing tobacco, uncomplicated: Secondary | ICD-10-CM | POA: Diagnosis not present

## 2018-07-12 DIAGNOSIS — I110 Hypertensive heart disease with heart failure: Secondary | ICD-10-CM | POA: Diagnosis not present

## 2018-07-12 DIAGNOSIS — H6123 Impacted cerumen, bilateral: Secondary | ICD-10-CM | POA: Diagnosis not present

## 2018-07-12 DIAGNOSIS — R350 Frequency of micturition: Secondary | ICD-10-CM | POA: Diagnosis not present

## 2018-07-12 DIAGNOSIS — F17220 Nicotine dependence, chewing tobacco, uncomplicated: Secondary | ICD-10-CM | POA: Diagnosis not present

## 2018-07-12 DIAGNOSIS — I5032 Chronic diastolic (congestive) heart failure: Secondary | ICD-10-CM | POA: Diagnosis not present

## 2018-07-12 DIAGNOSIS — D649 Anemia, unspecified: Secondary | ICD-10-CM | POA: Diagnosis not present

## 2018-07-12 DIAGNOSIS — G40901 Epilepsy, unspecified, not intractable, with status epilepticus: Secondary | ICD-10-CM | POA: Diagnosis not present

## 2018-07-12 DIAGNOSIS — G931 Anoxic brain damage, not elsewhere classified: Secondary | ICD-10-CM | POA: Diagnosis not present

## 2018-07-12 DIAGNOSIS — H903 Sensorineural hearing loss, bilateral: Secondary | ICD-10-CM | POA: Diagnosis not present

## 2018-07-12 DIAGNOSIS — E119 Type 2 diabetes mellitus without complications: Secondary | ICD-10-CM | POA: Diagnosis not present

## 2018-07-12 DIAGNOSIS — G009 Bacterial meningitis, unspecified: Secondary | ICD-10-CM | POA: Diagnosis not present

## 2018-07-13 DIAGNOSIS — G40901 Epilepsy, unspecified, not intractable, with status epilepticus: Secondary | ICD-10-CM | POA: Diagnosis not present

## 2018-07-13 DIAGNOSIS — G931 Anoxic brain damage, not elsewhere classified: Secondary | ICD-10-CM | POA: Diagnosis not present

## 2018-07-13 DIAGNOSIS — R350 Frequency of micturition: Secondary | ICD-10-CM | POA: Diagnosis not present

## 2018-07-13 DIAGNOSIS — F17220 Nicotine dependence, chewing tobacco, uncomplicated: Secondary | ICD-10-CM | POA: Diagnosis not present

## 2018-07-13 DIAGNOSIS — I110 Hypertensive heart disease with heart failure: Secondary | ICD-10-CM | POA: Diagnosis not present

## 2018-07-13 DIAGNOSIS — I5032 Chronic diastolic (congestive) heart failure: Secondary | ICD-10-CM | POA: Diagnosis not present

## 2018-07-13 DIAGNOSIS — D649 Anemia, unspecified: Secondary | ICD-10-CM | POA: Diagnosis not present

## 2018-07-13 DIAGNOSIS — E119 Type 2 diabetes mellitus without complications: Secondary | ICD-10-CM | POA: Diagnosis not present

## 2018-07-13 DIAGNOSIS — G009 Bacterial meningitis, unspecified: Secondary | ICD-10-CM | POA: Diagnosis not present

## 2018-07-14 DIAGNOSIS — I110 Hypertensive heart disease with heart failure: Secondary | ICD-10-CM | POA: Diagnosis not present

## 2018-07-14 DIAGNOSIS — G931 Anoxic brain damage, not elsewhere classified: Secondary | ICD-10-CM | POA: Diagnosis not present

## 2018-07-14 DIAGNOSIS — D649 Anemia, unspecified: Secondary | ICD-10-CM | POA: Diagnosis not present

## 2018-07-14 DIAGNOSIS — E119 Type 2 diabetes mellitus without complications: Secondary | ICD-10-CM | POA: Diagnosis not present

## 2018-07-14 DIAGNOSIS — F17220 Nicotine dependence, chewing tobacco, uncomplicated: Secondary | ICD-10-CM | POA: Diagnosis not present

## 2018-07-14 DIAGNOSIS — I5032 Chronic diastolic (congestive) heart failure: Secondary | ICD-10-CM | POA: Diagnosis not present

## 2018-07-14 DIAGNOSIS — G40901 Epilepsy, unspecified, not intractable, with status epilepticus: Secondary | ICD-10-CM | POA: Diagnosis not present

## 2018-07-14 DIAGNOSIS — R350 Frequency of micturition: Secondary | ICD-10-CM | POA: Diagnosis not present

## 2018-07-14 DIAGNOSIS — G009 Bacterial meningitis, unspecified: Secondary | ICD-10-CM | POA: Diagnosis not present

## 2018-07-17 DIAGNOSIS — I5032 Chronic diastolic (congestive) heart failure: Secondary | ICD-10-CM | POA: Diagnosis not present

## 2018-07-17 DIAGNOSIS — E119 Type 2 diabetes mellitus without complications: Secondary | ICD-10-CM | POA: Diagnosis not present

## 2018-07-17 DIAGNOSIS — I110 Hypertensive heart disease with heart failure: Secondary | ICD-10-CM | POA: Diagnosis not present

## 2018-07-17 DIAGNOSIS — G40901 Epilepsy, unspecified, not intractable, with status epilepticus: Secondary | ICD-10-CM | POA: Diagnosis not present

## 2018-07-17 DIAGNOSIS — G009 Bacterial meningitis, unspecified: Secondary | ICD-10-CM | POA: Diagnosis not present

## 2018-07-17 DIAGNOSIS — F17220 Nicotine dependence, chewing tobacco, uncomplicated: Secondary | ICD-10-CM | POA: Diagnosis not present

## 2018-07-17 DIAGNOSIS — G931 Anoxic brain damage, not elsewhere classified: Secondary | ICD-10-CM | POA: Diagnosis not present

## 2018-07-17 DIAGNOSIS — D649 Anemia, unspecified: Secondary | ICD-10-CM | POA: Diagnosis not present

## 2018-07-17 DIAGNOSIS — R350 Frequency of micturition: Secondary | ICD-10-CM | POA: Diagnosis not present

## 2018-07-18 DIAGNOSIS — F17220 Nicotine dependence, chewing tobacco, uncomplicated: Secondary | ICD-10-CM | POA: Diagnosis not present

## 2018-07-18 DIAGNOSIS — G931 Anoxic brain damage, not elsewhere classified: Secondary | ICD-10-CM | POA: Diagnosis not present

## 2018-07-18 DIAGNOSIS — G40901 Epilepsy, unspecified, not intractable, with status epilepticus: Secondary | ICD-10-CM | POA: Diagnosis not present

## 2018-07-18 DIAGNOSIS — I110 Hypertensive heart disease with heart failure: Secondary | ICD-10-CM | POA: Diagnosis not present

## 2018-07-18 DIAGNOSIS — I5032 Chronic diastolic (congestive) heart failure: Secondary | ICD-10-CM | POA: Diagnosis not present

## 2018-07-18 DIAGNOSIS — E119 Type 2 diabetes mellitus without complications: Secondary | ICD-10-CM | POA: Diagnosis not present

## 2018-07-18 DIAGNOSIS — D649 Anemia, unspecified: Secondary | ICD-10-CM | POA: Diagnosis not present

## 2018-07-18 DIAGNOSIS — G009 Bacterial meningitis, unspecified: Secondary | ICD-10-CM | POA: Diagnosis not present

## 2018-07-18 DIAGNOSIS — R350 Frequency of micturition: Secondary | ICD-10-CM | POA: Diagnosis not present

## 2018-07-19 DIAGNOSIS — G931 Anoxic brain damage, not elsewhere classified: Secondary | ICD-10-CM | POA: Diagnosis not present

## 2018-07-19 DIAGNOSIS — I5032 Chronic diastolic (congestive) heart failure: Secondary | ICD-10-CM | POA: Diagnosis not present

## 2018-07-19 DIAGNOSIS — E119 Type 2 diabetes mellitus without complications: Secondary | ICD-10-CM | POA: Diagnosis not present

## 2018-07-19 DIAGNOSIS — F17220 Nicotine dependence, chewing tobacco, uncomplicated: Secondary | ICD-10-CM | POA: Diagnosis not present

## 2018-07-19 DIAGNOSIS — G40901 Epilepsy, unspecified, not intractable, with status epilepticus: Secondary | ICD-10-CM | POA: Diagnosis not present

## 2018-07-19 DIAGNOSIS — I110 Hypertensive heart disease with heart failure: Secondary | ICD-10-CM | POA: Diagnosis not present

## 2018-07-19 DIAGNOSIS — G009 Bacterial meningitis, unspecified: Secondary | ICD-10-CM | POA: Diagnosis not present

## 2018-07-19 DIAGNOSIS — R350 Frequency of micturition: Secondary | ICD-10-CM | POA: Diagnosis not present

## 2018-07-19 DIAGNOSIS — D649 Anemia, unspecified: Secondary | ICD-10-CM | POA: Diagnosis not present

## 2018-07-20 DIAGNOSIS — G009 Bacterial meningitis, unspecified: Secondary | ICD-10-CM | POA: Diagnosis not present

## 2018-07-20 DIAGNOSIS — R350 Frequency of micturition: Secondary | ICD-10-CM | POA: Diagnosis not present

## 2018-07-20 DIAGNOSIS — G931 Anoxic brain damage, not elsewhere classified: Secondary | ICD-10-CM | POA: Diagnosis not present

## 2018-07-20 DIAGNOSIS — D649 Anemia, unspecified: Secondary | ICD-10-CM | POA: Diagnosis not present

## 2018-07-20 DIAGNOSIS — I5032 Chronic diastolic (congestive) heart failure: Secondary | ICD-10-CM | POA: Diagnosis not present

## 2018-07-20 DIAGNOSIS — I110 Hypertensive heart disease with heart failure: Secondary | ICD-10-CM | POA: Diagnosis not present

## 2018-07-20 DIAGNOSIS — G40901 Epilepsy, unspecified, not intractable, with status epilepticus: Secondary | ICD-10-CM | POA: Diagnosis not present

## 2018-07-20 DIAGNOSIS — E119 Type 2 diabetes mellitus without complications: Secondary | ICD-10-CM | POA: Diagnosis not present

## 2018-07-20 DIAGNOSIS — F17220 Nicotine dependence, chewing tobacco, uncomplicated: Secondary | ICD-10-CM | POA: Diagnosis not present

## 2018-07-21 DIAGNOSIS — E119 Type 2 diabetes mellitus without complications: Secondary | ICD-10-CM | POA: Diagnosis not present

## 2018-07-21 DIAGNOSIS — R350 Frequency of micturition: Secondary | ICD-10-CM | POA: Diagnosis not present

## 2018-07-21 DIAGNOSIS — F17220 Nicotine dependence, chewing tobacco, uncomplicated: Secondary | ICD-10-CM | POA: Diagnosis not present

## 2018-07-21 DIAGNOSIS — G931 Anoxic brain damage, not elsewhere classified: Secondary | ICD-10-CM | POA: Diagnosis not present

## 2018-07-21 DIAGNOSIS — I5032 Chronic diastolic (congestive) heart failure: Secondary | ICD-10-CM | POA: Diagnosis not present

## 2018-07-21 DIAGNOSIS — I110 Hypertensive heart disease with heart failure: Secondary | ICD-10-CM | POA: Diagnosis not present

## 2018-07-21 DIAGNOSIS — G40901 Epilepsy, unspecified, not intractable, with status epilepticus: Secondary | ICD-10-CM | POA: Diagnosis not present

## 2018-07-21 DIAGNOSIS — G009 Bacterial meningitis, unspecified: Secondary | ICD-10-CM | POA: Diagnosis not present

## 2018-07-21 DIAGNOSIS — D649 Anemia, unspecified: Secondary | ICD-10-CM | POA: Diagnosis not present

## 2018-07-24 DIAGNOSIS — G931 Anoxic brain damage, not elsewhere classified: Secondary | ICD-10-CM | POA: Diagnosis not present

## 2018-07-24 DIAGNOSIS — D649 Anemia, unspecified: Secondary | ICD-10-CM | POA: Diagnosis not present

## 2018-07-24 DIAGNOSIS — G009 Bacterial meningitis, unspecified: Secondary | ICD-10-CM | POA: Diagnosis not present

## 2018-07-24 DIAGNOSIS — G40901 Epilepsy, unspecified, not intractable, with status epilepticus: Secondary | ICD-10-CM | POA: Diagnosis not present

## 2018-07-24 DIAGNOSIS — I110 Hypertensive heart disease with heart failure: Secondary | ICD-10-CM | POA: Diagnosis not present

## 2018-07-24 DIAGNOSIS — I5032 Chronic diastolic (congestive) heart failure: Secondary | ICD-10-CM | POA: Diagnosis not present

## 2018-07-24 DIAGNOSIS — F17220 Nicotine dependence, chewing tobacco, uncomplicated: Secondary | ICD-10-CM | POA: Diagnosis not present

## 2018-07-24 DIAGNOSIS — R350 Frequency of micturition: Secondary | ICD-10-CM | POA: Diagnosis not present

## 2018-07-24 DIAGNOSIS — E119 Type 2 diabetes mellitus without complications: Secondary | ICD-10-CM | POA: Diagnosis not present

## 2018-07-24 DIAGNOSIS — H401130 Primary open-angle glaucoma, bilateral, stage unspecified: Secondary | ICD-10-CM | POA: Diagnosis not present

## 2018-07-26 DIAGNOSIS — R350 Frequency of micturition: Secondary | ICD-10-CM | POA: Diagnosis not present

## 2018-07-26 DIAGNOSIS — E119 Type 2 diabetes mellitus without complications: Secondary | ICD-10-CM | POA: Diagnosis not present

## 2018-07-26 DIAGNOSIS — G931 Anoxic brain damage, not elsewhere classified: Secondary | ICD-10-CM | POA: Diagnosis not present

## 2018-07-26 DIAGNOSIS — G009 Bacterial meningitis, unspecified: Secondary | ICD-10-CM | POA: Diagnosis not present

## 2018-07-26 DIAGNOSIS — F17220 Nicotine dependence, chewing tobacco, uncomplicated: Secondary | ICD-10-CM | POA: Diagnosis not present

## 2018-07-26 DIAGNOSIS — G40901 Epilepsy, unspecified, not intractable, with status epilepticus: Secondary | ICD-10-CM | POA: Diagnosis not present

## 2018-07-26 DIAGNOSIS — I5032 Chronic diastolic (congestive) heart failure: Secondary | ICD-10-CM | POA: Diagnosis not present

## 2018-07-26 DIAGNOSIS — D649 Anemia, unspecified: Secondary | ICD-10-CM | POA: Diagnosis not present

## 2018-07-26 DIAGNOSIS — I110 Hypertensive heart disease with heart failure: Secondary | ICD-10-CM | POA: Diagnosis not present

## 2018-07-28 DIAGNOSIS — D649 Anemia, unspecified: Secondary | ICD-10-CM | POA: Diagnosis not present

## 2018-07-28 DIAGNOSIS — R350 Frequency of micturition: Secondary | ICD-10-CM | POA: Diagnosis not present

## 2018-07-28 DIAGNOSIS — I5032 Chronic diastolic (congestive) heart failure: Secondary | ICD-10-CM | POA: Diagnosis not present

## 2018-07-28 DIAGNOSIS — G40901 Epilepsy, unspecified, not intractable, with status epilepticus: Secondary | ICD-10-CM | POA: Diagnosis not present

## 2018-07-28 DIAGNOSIS — F17220 Nicotine dependence, chewing tobacco, uncomplicated: Secondary | ICD-10-CM | POA: Diagnosis not present

## 2018-07-28 DIAGNOSIS — E119 Type 2 diabetes mellitus without complications: Secondary | ICD-10-CM | POA: Diagnosis not present

## 2018-07-28 DIAGNOSIS — G009 Bacterial meningitis, unspecified: Secondary | ICD-10-CM | POA: Diagnosis not present

## 2018-07-28 DIAGNOSIS — G931 Anoxic brain damage, not elsewhere classified: Secondary | ICD-10-CM | POA: Diagnosis not present

## 2018-07-28 DIAGNOSIS — I110 Hypertensive heart disease with heart failure: Secondary | ICD-10-CM | POA: Diagnosis not present

## 2018-07-31 DIAGNOSIS — I110 Hypertensive heart disease with heart failure: Secondary | ICD-10-CM | POA: Diagnosis not present

## 2018-07-31 DIAGNOSIS — F17220 Nicotine dependence, chewing tobacco, uncomplicated: Secondary | ICD-10-CM | POA: Diagnosis not present

## 2018-07-31 DIAGNOSIS — G009 Bacterial meningitis, unspecified: Secondary | ICD-10-CM | POA: Diagnosis not present

## 2018-07-31 DIAGNOSIS — G931 Anoxic brain damage, not elsewhere classified: Secondary | ICD-10-CM | POA: Diagnosis not present

## 2018-07-31 DIAGNOSIS — D649 Anemia, unspecified: Secondary | ICD-10-CM | POA: Diagnosis not present

## 2018-07-31 DIAGNOSIS — G40901 Epilepsy, unspecified, not intractable, with status epilepticus: Secondary | ICD-10-CM | POA: Diagnosis not present

## 2018-07-31 DIAGNOSIS — E119 Type 2 diabetes mellitus without complications: Secondary | ICD-10-CM | POA: Diagnosis not present

## 2018-07-31 DIAGNOSIS — I5032 Chronic diastolic (congestive) heart failure: Secondary | ICD-10-CM | POA: Diagnosis not present

## 2018-07-31 DIAGNOSIS — R350 Frequency of micturition: Secondary | ICD-10-CM | POA: Diagnosis not present

## 2018-08-02 DIAGNOSIS — R350 Frequency of micturition: Secondary | ICD-10-CM | POA: Diagnosis not present

## 2018-08-02 DIAGNOSIS — F17220 Nicotine dependence, chewing tobacco, uncomplicated: Secondary | ICD-10-CM | POA: Diagnosis not present

## 2018-08-02 DIAGNOSIS — G40901 Epilepsy, unspecified, not intractable, with status epilepticus: Secondary | ICD-10-CM | POA: Diagnosis not present

## 2018-08-02 DIAGNOSIS — E119 Type 2 diabetes mellitus without complications: Secondary | ICD-10-CM | POA: Diagnosis not present

## 2018-08-02 DIAGNOSIS — D649 Anemia, unspecified: Secondary | ICD-10-CM | POA: Diagnosis not present

## 2018-08-02 DIAGNOSIS — G009 Bacterial meningitis, unspecified: Secondary | ICD-10-CM | POA: Diagnosis not present

## 2018-08-02 DIAGNOSIS — I5032 Chronic diastolic (congestive) heart failure: Secondary | ICD-10-CM | POA: Diagnosis not present

## 2018-08-02 DIAGNOSIS — G931 Anoxic brain damage, not elsewhere classified: Secondary | ICD-10-CM | POA: Diagnosis not present

## 2018-08-02 DIAGNOSIS — I110 Hypertensive heart disease with heart failure: Secondary | ICD-10-CM | POA: Diagnosis not present

## 2018-08-04 DIAGNOSIS — G931 Anoxic brain damage, not elsewhere classified: Secondary | ICD-10-CM | POA: Diagnosis not present

## 2018-08-04 DIAGNOSIS — E119 Type 2 diabetes mellitus without complications: Secondary | ICD-10-CM | POA: Diagnosis not present

## 2018-08-04 DIAGNOSIS — I5032 Chronic diastolic (congestive) heart failure: Secondary | ICD-10-CM | POA: Diagnosis not present

## 2018-08-04 DIAGNOSIS — F17220 Nicotine dependence, chewing tobacco, uncomplicated: Secondary | ICD-10-CM | POA: Diagnosis not present

## 2018-08-04 DIAGNOSIS — D649 Anemia, unspecified: Secondary | ICD-10-CM | POA: Diagnosis not present

## 2018-08-04 DIAGNOSIS — R350 Frequency of micturition: Secondary | ICD-10-CM | POA: Diagnosis not present

## 2018-08-04 DIAGNOSIS — G009 Bacterial meningitis, unspecified: Secondary | ICD-10-CM | POA: Diagnosis not present

## 2018-08-04 DIAGNOSIS — G40901 Epilepsy, unspecified, not intractable, with status epilepticus: Secondary | ICD-10-CM | POA: Diagnosis not present

## 2018-08-04 DIAGNOSIS — I110 Hypertensive heart disease with heart failure: Secondary | ICD-10-CM | POA: Diagnosis not present

## 2018-08-07 DIAGNOSIS — I110 Hypertensive heart disease with heart failure: Secondary | ICD-10-CM | POA: Diagnosis not present

## 2018-08-07 DIAGNOSIS — E119 Type 2 diabetes mellitus without complications: Secondary | ICD-10-CM | POA: Diagnosis not present

## 2018-08-07 DIAGNOSIS — G40901 Epilepsy, unspecified, not intractable, with status epilepticus: Secondary | ICD-10-CM | POA: Diagnosis not present

## 2018-08-07 DIAGNOSIS — R350 Frequency of micturition: Secondary | ICD-10-CM | POA: Diagnosis not present

## 2018-08-07 DIAGNOSIS — G931 Anoxic brain damage, not elsewhere classified: Secondary | ICD-10-CM | POA: Diagnosis not present

## 2018-08-07 DIAGNOSIS — F17220 Nicotine dependence, chewing tobacco, uncomplicated: Secondary | ICD-10-CM | POA: Diagnosis not present

## 2018-08-07 DIAGNOSIS — G009 Bacterial meningitis, unspecified: Secondary | ICD-10-CM | POA: Diagnosis not present

## 2018-08-07 DIAGNOSIS — D649 Anemia, unspecified: Secondary | ICD-10-CM | POA: Diagnosis not present

## 2018-08-07 DIAGNOSIS — I5032 Chronic diastolic (congestive) heart failure: Secondary | ICD-10-CM | POA: Diagnosis not present

## 2018-08-11 DIAGNOSIS — G40901 Epilepsy, unspecified, not intractable, with status epilepticus: Secondary | ICD-10-CM | POA: Diagnosis not present

## 2018-08-11 DIAGNOSIS — R350 Frequency of micturition: Secondary | ICD-10-CM | POA: Diagnosis not present

## 2018-08-11 DIAGNOSIS — I110 Hypertensive heart disease with heart failure: Secondary | ICD-10-CM | POA: Diagnosis not present

## 2018-08-11 DIAGNOSIS — E119 Type 2 diabetes mellitus without complications: Secondary | ICD-10-CM | POA: Diagnosis not present

## 2018-08-11 DIAGNOSIS — G931 Anoxic brain damage, not elsewhere classified: Secondary | ICD-10-CM | POA: Diagnosis not present

## 2018-08-11 DIAGNOSIS — D649 Anemia, unspecified: Secondary | ICD-10-CM | POA: Diagnosis not present

## 2018-08-11 DIAGNOSIS — I5032 Chronic diastolic (congestive) heart failure: Secondary | ICD-10-CM | POA: Diagnosis not present

## 2018-08-11 DIAGNOSIS — G009 Bacterial meningitis, unspecified: Secondary | ICD-10-CM | POA: Diagnosis not present

## 2018-08-11 DIAGNOSIS — F17220 Nicotine dependence, chewing tobacco, uncomplicated: Secondary | ICD-10-CM | POA: Diagnosis not present

## 2018-08-14 DIAGNOSIS — I110 Hypertensive heart disease with heart failure: Secondary | ICD-10-CM | POA: Diagnosis not present

## 2018-08-14 DIAGNOSIS — R350 Frequency of micturition: Secondary | ICD-10-CM | POA: Diagnosis not present

## 2018-08-14 DIAGNOSIS — G40901 Epilepsy, unspecified, not intractable, with status epilepticus: Secondary | ICD-10-CM | POA: Diagnosis not present

## 2018-08-14 DIAGNOSIS — E119 Type 2 diabetes mellitus without complications: Secondary | ICD-10-CM | POA: Diagnosis not present

## 2018-08-14 DIAGNOSIS — D649 Anemia, unspecified: Secondary | ICD-10-CM | POA: Diagnosis not present

## 2018-08-14 DIAGNOSIS — F17220 Nicotine dependence, chewing tobacco, uncomplicated: Secondary | ICD-10-CM | POA: Diagnosis not present

## 2018-08-14 DIAGNOSIS — I5032 Chronic diastolic (congestive) heart failure: Secondary | ICD-10-CM | POA: Diagnosis not present

## 2018-08-14 DIAGNOSIS — G931 Anoxic brain damage, not elsewhere classified: Secondary | ICD-10-CM | POA: Diagnosis not present

## 2018-08-14 DIAGNOSIS — G009 Bacterial meningitis, unspecified: Secondary | ICD-10-CM | POA: Diagnosis not present

## 2018-09-28 DIAGNOSIS — R413 Other amnesia: Secondary | ICD-10-CM | POA: Diagnosis not present

## 2018-10-06 DIAGNOSIS — E1140 Type 2 diabetes mellitus with diabetic neuropathy, unspecified: Secondary | ICD-10-CM | POA: Diagnosis not present

## 2018-10-06 DIAGNOSIS — R262 Difficulty in walking, not elsewhere classified: Secondary | ICD-10-CM | POA: Diagnosis not present

## 2018-10-06 DIAGNOSIS — F3341 Major depressive disorder, recurrent, in partial remission: Secondary | ICD-10-CM | POA: Diagnosis not present

## 2018-10-06 DIAGNOSIS — I10 Essential (primary) hypertension: Secondary | ICD-10-CM | POA: Diagnosis not present

## 2018-10-06 DIAGNOSIS — R413 Other amnesia: Secondary | ICD-10-CM | POA: Diagnosis not present

## 2018-10-06 DIAGNOSIS — D649 Anemia, unspecified: Secondary | ICD-10-CM | POA: Diagnosis not present

## 2018-10-06 DIAGNOSIS — E559 Vitamin D deficiency, unspecified: Secondary | ICD-10-CM | POA: Diagnosis not present

## 2018-10-06 DIAGNOSIS — Z79899 Other long term (current) drug therapy: Secondary | ICD-10-CM | POA: Diagnosis not present

## 2018-10-06 DIAGNOSIS — R32 Unspecified urinary incontinence: Secondary | ICD-10-CM | POA: Diagnosis not present

## 2018-10-06 DIAGNOSIS — F0390 Unspecified dementia without behavioral disturbance: Secondary | ICD-10-CM | POA: Diagnosis not present

## 2018-10-10 DIAGNOSIS — D649 Anemia, unspecified: Secondary | ICD-10-CM | POA: Diagnosis not present

## 2018-10-10 DIAGNOSIS — R413 Other amnesia: Secondary | ICD-10-CM | POA: Diagnosis not present

## 2018-10-10 DIAGNOSIS — R2689 Other abnormalities of gait and mobility: Secondary | ICD-10-CM | POA: Diagnosis not present

## 2018-10-10 DIAGNOSIS — G9340 Encephalopathy, unspecified: Secondary | ICD-10-CM | POA: Diagnosis not present

## 2018-10-10 DIAGNOSIS — E119 Type 2 diabetes mellitus without complications: Secondary | ICD-10-CM | POA: Diagnosis not present

## 2018-10-10 DIAGNOSIS — M1A00X0 Idiopathic chronic gout, unspecified site, without tophus (tophi): Secondary | ICD-10-CM | POA: Diagnosis not present

## 2018-10-10 DIAGNOSIS — M6281 Muscle weakness (generalized): Secondary | ICD-10-CM | POA: Diagnosis not present

## 2018-10-10 DIAGNOSIS — I10 Essential (primary) hypertension: Secondary | ICD-10-CM | POA: Diagnosis not present

## 2018-10-10 DIAGNOSIS — F0390 Unspecified dementia without behavioral disturbance: Secondary | ICD-10-CM | POA: Diagnosis not present

## 2018-10-11 DIAGNOSIS — D649 Anemia, unspecified: Secondary | ICD-10-CM | POA: Diagnosis not present

## 2018-10-11 DIAGNOSIS — E119 Type 2 diabetes mellitus without complications: Secondary | ICD-10-CM | POA: Diagnosis not present

## 2018-10-11 DIAGNOSIS — G9340 Encephalopathy, unspecified: Secondary | ICD-10-CM | POA: Diagnosis not present

## 2018-10-11 DIAGNOSIS — R2689 Other abnormalities of gait and mobility: Secondary | ICD-10-CM | POA: Diagnosis not present

## 2018-10-11 DIAGNOSIS — M1A00X0 Idiopathic chronic gout, unspecified site, without tophus (tophi): Secondary | ICD-10-CM | POA: Diagnosis not present

## 2018-10-11 DIAGNOSIS — M6281 Muscle weakness (generalized): Secondary | ICD-10-CM | POA: Diagnosis not present

## 2018-10-11 DIAGNOSIS — I10 Essential (primary) hypertension: Secondary | ICD-10-CM | POA: Diagnosis not present

## 2018-10-11 DIAGNOSIS — F0390 Unspecified dementia without behavioral disturbance: Secondary | ICD-10-CM | POA: Diagnosis not present

## 2018-10-11 DIAGNOSIS — R413 Other amnesia: Secondary | ICD-10-CM | POA: Diagnosis not present

## 2018-10-13 DIAGNOSIS — Z0000 Encounter for general adult medical examination without abnormal findings: Secondary | ICD-10-CM | POA: Diagnosis not present

## 2018-10-13 DIAGNOSIS — E1142 Type 2 diabetes mellitus with diabetic polyneuropathy: Secondary | ICD-10-CM | POA: Diagnosis not present

## 2018-10-13 DIAGNOSIS — R2689 Other abnormalities of gait and mobility: Secondary | ICD-10-CM | POA: Diagnosis not present

## 2018-10-13 DIAGNOSIS — I69359 Hemiplegia and hemiparesis following cerebral infarction affecting unspecified side: Secondary | ICD-10-CM | POA: Diagnosis not present

## 2018-10-13 DIAGNOSIS — F0390 Unspecified dementia without behavioral disturbance: Secondary | ICD-10-CM | POA: Diagnosis not present

## 2018-10-13 DIAGNOSIS — I10 Essential (primary) hypertension: Secondary | ICD-10-CM | POA: Diagnosis not present

## 2018-10-13 DIAGNOSIS — M1A00X0 Idiopathic chronic gout, unspecified site, without tophus (tophi): Secondary | ICD-10-CM | POA: Diagnosis not present

## 2018-10-13 DIAGNOSIS — M6281 Muscle weakness (generalized): Secondary | ICD-10-CM | POA: Diagnosis not present

## 2018-10-13 DIAGNOSIS — R262 Difficulty in walking, not elsewhere classified: Secondary | ICD-10-CM | POA: Diagnosis not present

## 2018-10-13 DIAGNOSIS — E1165 Type 2 diabetes mellitus with hyperglycemia: Secondary | ICD-10-CM | POA: Diagnosis not present

## 2018-10-13 DIAGNOSIS — E119 Type 2 diabetes mellitus without complications: Secondary | ICD-10-CM | POA: Diagnosis not present

## 2018-10-13 DIAGNOSIS — G9340 Encephalopathy, unspecified: Secondary | ICD-10-CM | POA: Diagnosis not present

## 2018-10-13 DIAGNOSIS — E1140 Type 2 diabetes mellitus with diabetic neuropathy, unspecified: Secondary | ICD-10-CM | POA: Diagnosis not present

## 2018-10-13 DIAGNOSIS — D649 Anemia, unspecified: Secondary | ICD-10-CM | POA: Diagnosis not present

## 2018-10-13 DIAGNOSIS — R413 Other amnesia: Secondary | ICD-10-CM | POA: Diagnosis not present

## 2018-10-13 DIAGNOSIS — F0150 Vascular dementia without behavioral disturbance: Secondary | ICD-10-CM | POA: Diagnosis not present

## 2018-10-13 DIAGNOSIS — R32 Unspecified urinary incontinence: Secondary | ICD-10-CM | POA: Diagnosis not present

## 2018-10-14 DIAGNOSIS — I10 Essential (primary) hypertension: Secondary | ICD-10-CM | POA: Diagnosis not present

## 2018-10-14 DIAGNOSIS — M1A00X0 Idiopathic chronic gout, unspecified site, without tophus (tophi): Secondary | ICD-10-CM | POA: Diagnosis not present

## 2018-10-14 DIAGNOSIS — D649 Anemia, unspecified: Secondary | ICD-10-CM | POA: Diagnosis not present

## 2018-10-14 DIAGNOSIS — R2689 Other abnormalities of gait and mobility: Secondary | ICD-10-CM | POA: Diagnosis not present

## 2018-10-14 DIAGNOSIS — R413 Other amnesia: Secondary | ICD-10-CM | POA: Diagnosis not present

## 2018-10-14 DIAGNOSIS — F0390 Unspecified dementia without behavioral disturbance: Secondary | ICD-10-CM | POA: Diagnosis not present

## 2018-10-14 DIAGNOSIS — M6281 Muscle weakness (generalized): Secondary | ICD-10-CM | POA: Diagnosis not present

## 2018-10-14 DIAGNOSIS — G9340 Encephalopathy, unspecified: Secondary | ICD-10-CM | POA: Diagnosis not present

## 2018-10-14 DIAGNOSIS — E119 Type 2 diabetes mellitus without complications: Secondary | ICD-10-CM | POA: Diagnosis not present

## 2018-10-17 DIAGNOSIS — E119 Type 2 diabetes mellitus without complications: Secondary | ICD-10-CM | POA: Diagnosis not present

## 2018-10-17 DIAGNOSIS — D649 Anemia, unspecified: Secondary | ICD-10-CM | POA: Diagnosis not present

## 2018-10-17 DIAGNOSIS — G9340 Encephalopathy, unspecified: Secondary | ICD-10-CM | POA: Diagnosis not present

## 2018-10-17 DIAGNOSIS — R413 Other amnesia: Secondary | ICD-10-CM | POA: Diagnosis not present

## 2018-10-17 DIAGNOSIS — F0390 Unspecified dementia without behavioral disturbance: Secondary | ICD-10-CM | POA: Diagnosis not present

## 2018-10-17 DIAGNOSIS — R2689 Other abnormalities of gait and mobility: Secondary | ICD-10-CM | POA: Diagnosis not present

## 2018-10-17 DIAGNOSIS — I10 Essential (primary) hypertension: Secondary | ICD-10-CM | POA: Diagnosis not present

## 2018-10-17 DIAGNOSIS — M6281 Muscle weakness (generalized): Secondary | ICD-10-CM | POA: Diagnosis not present

## 2018-10-17 DIAGNOSIS — M1A00X0 Idiopathic chronic gout, unspecified site, without tophus (tophi): Secondary | ICD-10-CM | POA: Diagnosis not present

## 2018-10-18 DIAGNOSIS — M1A00X0 Idiopathic chronic gout, unspecified site, without tophus (tophi): Secondary | ICD-10-CM | POA: Diagnosis not present

## 2018-10-18 DIAGNOSIS — G9340 Encephalopathy, unspecified: Secondary | ICD-10-CM | POA: Diagnosis not present

## 2018-10-18 DIAGNOSIS — R413 Other amnesia: Secondary | ICD-10-CM | POA: Diagnosis not present

## 2018-10-18 DIAGNOSIS — E119 Type 2 diabetes mellitus without complications: Secondary | ICD-10-CM | POA: Diagnosis not present

## 2018-10-18 DIAGNOSIS — F0390 Unspecified dementia without behavioral disturbance: Secondary | ICD-10-CM | POA: Diagnosis not present

## 2018-10-18 DIAGNOSIS — M6281 Muscle weakness (generalized): Secondary | ICD-10-CM | POA: Diagnosis not present

## 2018-10-18 DIAGNOSIS — D649 Anemia, unspecified: Secondary | ICD-10-CM | POA: Diagnosis not present

## 2018-10-18 DIAGNOSIS — R2689 Other abnormalities of gait and mobility: Secondary | ICD-10-CM | POA: Diagnosis not present

## 2018-10-18 DIAGNOSIS — I10 Essential (primary) hypertension: Secondary | ICD-10-CM | POA: Diagnosis not present

## 2018-10-20 DIAGNOSIS — M1A00X0 Idiopathic chronic gout, unspecified site, without tophus (tophi): Secondary | ICD-10-CM | POA: Diagnosis not present

## 2018-10-20 DIAGNOSIS — G9340 Encephalopathy, unspecified: Secondary | ICD-10-CM | POA: Diagnosis not present

## 2018-10-20 DIAGNOSIS — E119 Type 2 diabetes mellitus without complications: Secondary | ICD-10-CM | POA: Diagnosis not present

## 2018-10-20 DIAGNOSIS — D649 Anemia, unspecified: Secondary | ICD-10-CM | POA: Diagnosis not present

## 2018-10-20 DIAGNOSIS — M6281 Muscle weakness (generalized): Secondary | ICD-10-CM | POA: Diagnosis not present

## 2018-10-20 DIAGNOSIS — R2689 Other abnormalities of gait and mobility: Secondary | ICD-10-CM | POA: Diagnosis not present

## 2018-10-20 DIAGNOSIS — R413 Other amnesia: Secondary | ICD-10-CM | POA: Diagnosis not present

## 2018-10-20 DIAGNOSIS — F0390 Unspecified dementia without behavioral disturbance: Secondary | ICD-10-CM | POA: Diagnosis not present

## 2018-10-20 DIAGNOSIS — I10 Essential (primary) hypertension: Secondary | ICD-10-CM | POA: Diagnosis not present

## 2018-10-23 DIAGNOSIS — M6281 Muscle weakness (generalized): Secondary | ICD-10-CM | POA: Diagnosis not present

## 2018-10-23 DIAGNOSIS — F0390 Unspecified dementia without behavioral disturbance: Secondary | ICD-10-CM | POA: Diagnosis not present

## 2018-10-23 DIAGNOSIS — R2689 Other abnormalities of gait and mobility: Secondary | ICD-10-CM | POA: Diagnosis not present

## 2018-10-23 DIAGNOSIS — M1A00X0 Idiopathic chronic gout, unspecified site, without tophus (tophi): Secondary | ICD-10-CM | POA: Diagnosis not present

## 2018-10-23 DIAGNOSIS — I10 Essential (primary) hypertension: Secondary | ICD-10-CM | POA: Diagnosis not present

## 2018-10-23 DIAGNOSIS — E119 Type 2 diabetes mellitus without complications: Secondary | ICD-10-CM | POA: Diagnosis not present

## 2018-10-23 DIAGNOSIS — D649 Anemia, unspecified: Secondary | ICD-10-CM | POA: Diagnosis not present

## 2018-10-23 DIAGNOSIS — R413 Other amnesia: Secondary | ICD-10-CM | POA: Diagnosis not present

## 2018-10-23 DIAGNOSIS — G9340 Encephalopathy, unspecified: Secondary | ICD-10-CM | POA: Diagnosis not present

## 2018-10-25 DIAGNOSIS — M1A00X0 Idiopathic chronic gout, unspecified site, without tophus (tophi): Secondary | ICD-10-CM | POA: Diagnosis not present

## 2018-10-25 DIAGNOSIS — E119 Type 2 diabetes mellitus without complications: Secondary | ICD-10-CM | POA: Diagnosis not present

## 2018-10-25 DIAGNOSIS — R413 Other amnesia: Secondary | ICD-10-CM | POA: Diagnosis not present

## 2018-10-25 DIAGNOSIS — D649 Anemia, unspecified: Secondary | ICD-10-CM | POA: Diagnosis not present

## 2018-10-25 DIAGNOSIS — G9340 Encephalopathy, unspecified: Secondary | ICD-10-CM | POA: Diagnosis not present

## 2018-10-25 DIAGNOSIS — R2689 Other abnormalities of gait and mobility: Secondary | ICD-10-CM | POA: Diagnosis not present

## 2018-10-25 DIAGNOSIS — F0390 Unspecified dementia without behavioral disturbance: Secondary | ICD-10-CM | POA: Diagnosis not present

## 2018-10-25 DIAGNOSIS — I10 Essential (primary) hypertension: Secondary | ICD-10-CM | POA: Diagnosis not present

## 2018-10-25 DIAGNOSIS — M6281 Muscle weakness (generalized): Secondary | ICD-10-CM | POA: Diagnosis not present

## 2018-10-26 DIAGNOSIS — M1A00X0 Idiopathic chronic gout, unspecified site, without tophus (tophi): Secondary | ICD-10-CM | POA: Diagnosis not present

## 2018-10-26 DIAGNOSIS — R413 Other amnesia: Secondary | ICD-10-CM | POA: Diagnosis not present

## 2018-10-26 DIAGNOSIS — I10 Essential (primary) hypertension: Secondary | ICD-10-CM | POA: Diagnosis not present

## 2018-10-26 DIAGNOSIS — E119 Type 2 diabetes mellitus without complications: Secondary | ICD-10-CM | POA: Diagnosis not present

## 2018-10-26 DIAGNOSIS — F0390 Unspecified dementia without behavioral disturbance: Secondary | ICD-10-CM | POA: Diagnosis not present

## 2018-10-26 DIAGNOSIS — D649 Anemia, unspecified: Secondary | ICD-10-CM | POA: Diagnosis not present

## 2018-10-26 DIAGNOSIS — G9340 Encephalopathy, unspecified: Secondary | ICD-10-CM | POA: Diagnosis not present

## 2018-10-26 DIAGNOSIS — R2689 Other abnormalities of gait and mobility: Secondary | ICD-10-CM | POA: Diagnosis not present

## 2018-10-26 DIAGNOSIS — M6281 Muscle weakness (generalized): Secondary | ICD-10-CM | POA: Diagnosis not present

## 2018-10-27 DIAGNOSIS — E119 Type 2 diabetes mellitus without complications: Secondary | ICD-10-CM | POA: Diagnosis not present

## 2018-10-27 DIAGNOSIS — M1A00X0 Idiopathic chronic gout, unspecified site, without tophus (tophi): Secondary | ICD-10-CM | POA: Diagnosis not present

## 2018-10-27 DIAGNOSIS — G9340 Encephalopathy, unspecified: Secondary | ICD-10-CM | POA: Diagnosis not present

## 2018-10-27 DIAGNOSIS — R413 Other amnesia: Secondary | ICD-10-CM | POA: Diagnosis not present

## 2018-10-27 DIAGNOSIS — M6281 Muscle weakness (generalized): Secondary | ICD-10-CM | POA: Diagnosis not present

## 2018-10-27 DIAGNOSIS — F0390 Unspecified dementia without behavioral disturbance: Secondary | ICD-10-CM | POA: Diagnosis not present

## 2018-10-27 DIAGNOSIS — R2689 Other abnormalities of gait and mobility: Secondary | ICD-10-CM | POA: Diagnosis not present

## 2018-10-27 DIAGNOSIS — D649 Anemia, unspecified: Secondary | ICD-10-CM | POA: Diagnosis not present

## 2018-10-27 DIAGNOSIS — I10 Essential (primary) hypertension: Secondary | ICD-10-CM | POA: Diagnosis not present

## 2018-10-30 DIAGNOSIS — R413 Other amnesia: Secondary | ICD-10-CM | POA: Diagnosis not present

## 2018-10-30 DIAGNOSIS — M1A00X0 Idiopathic chronic gout, unspecified site, without tophus (tophi): Secondary | ICD-10-CM | POA: Diagnosis not present

## 2018-10-30 DIAGNOSIS — M6281 Muscle weakness (generalized): Secondary | ICD-10-CM | POA: Diagnosis not present

## 2018-10-30 DIAGNOSIS — F0390 Unspecified dementia without behavioral disturbance: Secondary | ICD-10-CM | POA: Diagnosis not present

## 2018-10-30 DIAGNOSIS — G9340 Encephalopathy, unspecified: Secondary | ICD-10-CM | POA: Diagnosis not present

## 2018-10-30 DIAGNOSIS — E119 Type 2 diabetes mellitus without complications: Secondary | ICD-10-CM | POA: Diagnosis not present

## 2018-10-30 DIAGNOSIS — R2689 Other abnormalities of gait and mobility: Secondary | ICD-10-CM | POA: Diagnosis not present

## 2018-10-30 DIAGNOSIS — I10 Essential (primary) hypertension: Secondary | ICD-10-CM | POA: Diagnosis not present

## 2018-10-30 DIAGNOSIS — D649 Anemia, unspecified: Secondary | ICD-10-CM | POA: Diagnosis not present

## 2018-11-02 DIAGNOSIS — M6281 Muscle weakness (generalized): Secondary | ICD-10-CM | POA: Diagnosis not present

## 2018-11-02 DIAGNOSIS — R2689 Other abnormalities of gait and mobility: Secondary | ICD-10-CM | POA: Diagnosis not present

## 2018-11-02 DIAGNOSIS — D649 Anemia, unspecified: Secondary | ICD-10-CM | POA: Diagnosis not present

## 2018-11-02 DIAGNOSIS — E119 Type 2 diabetes mellitus without complications: Secondary | ICD-10-CM | POA: Diagnosis not present

## 2018-11-02 DIAGNOSIS — I10 Essential (primary) hypertension: Secondary | ICD-10-CM | POA: Diagnosis not present

## 2018-11-02 DIAGNOSIS — R413 Other amnesia: Secondary | ICD-10-CM | POA: Diagnosis not present

## 2018-11-02 DIAGNOSIS — M1A00X0 Idiopathic chronic gout, unspecified site, without tophus (tophi): Secondary | ICD-10-CM | POA: Diagnosis not present

## 2018-11-02 DIAGNOSIS — G9340 Encephalopathy, unspecified: Secondary | ICD-10-CM | POA: Diagnosis not present

## 2018-11-02 DIAGNOSIS — F0390 Unspecified dementia without behavioral disturbance: Secondary | ICD-10-CM | POA: Diagnosis not present

## 2018-11-06 DIAGNOSIS — R413 Other amnesia: Secondary | ICD-10-CM | POA: Diagnosis not present

## 2018-11-06 DIAGNOSIS — E119 Type 2 diabetes mellitus without complications: Secondary | ICD-10-CM | POA: Diagnosis not present

## 2018-11-06 DIAGNOSIS — G9340 Encephalopathy, unspecified: Secondary | ICD-10-CM | POA: Diagnosis not present

## 2018-11-06 DIAGNOSIS — F0390 Unspecified dementia without behavioral disturbance: Secondary | ICD-10-CM | POA: Diagnosis not present

## 2018-11-06 DIAGNOSIS — D649 Anemia, unspecified: Secondary | ICD-10-CM | POA: Diagnosis not present

## 2018-11-06 DIAGNOSIS — M1A00X0 Idiopathic chronic gout, unspecified site, without tophus (tophi): Secondary | ICD-10-CM | POA: Diagnosis not present

## 2018-11-06 DIAGNOSIS — I10 Essential (primary) hypertension: Secondary | ICD-10-CM | POA: Diagnosis not present

## 2018-11-06 DIAGNOSIS — M6281 Muscle weakness (generalized): Secondary | ICD-10-CM | POA: Diagnosis not present

## 2018-11-06 DIAGNOSIS — R2689 Other abnormalities of gait and mobility: Secondary | ICD-10-CM | POA: Diagnosis not present

## 2018-11-09 DIAGNOSIS — E119 Type 2 diabetes mellitus without complications: Secondary | ICD-10-CM | POA: Diagnosis not present

## 2018-11-09 DIAGNOSIS — D649 Anemia, unspecified: Secondary | ICD-10-CM | POA: Diagnosis not present

## 2018-11-09 DIAGNOSIS — M6281 Muscle weakness (generalized): Secondary | ICD-10-CM | POA: Diagnosis not present

## 2018-11-09 DIAGNOSIS — R413 Other amnesia: Secondary | ICD-10-CM | POA: Diagnosis not present

## 2018-11-09 DIAGNOSIS — M1A00X0 Idiopathic chronic gout, unspecified site, without tophus (tophi): Secondary | ICD-10-CM | POA: Diagnosis not present

## 2018-11-09 DIAGNOSIS — F0390 Unspecified dementia without behavioral disturbance: Secondary | ICD-10-CM | POA: Diagnosis not present

## 2018-11-09 DIAGNOSIS — G9340 Encephalopathy, unspecified: Secondary | ICD-10-CM | POA: Diagnosis not present

## 2018-11-09 DIAGNOSIS — I10 Essential (primary) hypertension: Secondary | ICD-10-CM | POA: Diagnosis not present

## 2018-11-09 DIAGNOSIS — R2689 Other abnormalities of gait and mobility: Secondary | ICD-10-CM | POA: Diagnosis not present

## 2019-02-02 DIAGNOSIS — R8290 Unspecified abnormal findings in urine: Secondary | ICD-10-CM | POA: Diagnosis not present

## 2019-02-02 DIAGNOSIS — Z0000 Encounter for general adult medical examination without abnormal findings: Secondary | ICD-10-CM | POA: Diagnosis not present

## 2019-02-02 DIAGNOSIS — E1165 Type 2 diabetes mellitus with hyperglycemia: Secondary | ICD-10-CM | POA: Diagnosis not present

## 2019-02-02 DIAGNOSIS — I69359 Hemiplegia and hemiparesis following cerebral infarction affecting unspecified side: Secondary | ICD-10-CM | POA: Diagnosis not present

## 2019-02-02 DIAGNOSIS — F0150 Vascular dementia without behavioral disturbance: Secondary | ICD-10-CM | POA: Diagnosis not present

## 2019-02-02 DIAGNOSIS — I10 Essential (primary) hypertension: Secondary | ICD-10-CM | POA: Diagnosis not present

## 2019-02-02 DIAGNOSIS — E1140 Type 2 diabetes mellitus with diabetic neuropathy, unspecified: Secondary | ICD-10-CM | POA: Diagnosis not present

## 2019-02-09 DIAGNOSIS — F331 Major depressive disorder, recurrent, moderate: Secondary | ICD-10-CM | POA: Diagnosis not present

## 2019-02-09 DIAGNOSIS — F0390 Unspecified dementia without behavioral disturbance: Secondary | ICD-10-CM | POA: Diagnosis not present

## 2019-02-09 DIAGNOSIS — R2681 Unsteadiness on feet: Secondary | ICD-10-CM | POA: Diagnosis not present

## 2019-02-09 DIAGNOSIS — R400 Somnolence: Secondary | ICD-10-CM | POA: Diagnosis not present

## 2019-02-09 DIAGNOSIS — I69359 Hemiplegia and hemiparesis following cerebral infarction affecting unspecified side: Secondary | ICD-10-CM | POA: Diagnosis not present

## 2019-02-09 DIAGNOSIS — I10 Essential (primary) hypertension: Secondary | ICD-10-CM | POA: Diagnosis not present

## 2019-02-09 DIAGNOSIS — R5381 Other malaise: Secondary | ICD-10-CM | POA: Diagnosis not present

## 2019-02-09 DIAGNOSIS — Z0000 Encounter for general adult medical examination without abnormal findings: Secondary | ICD-10-CM | POA: Diagnosis not present

## 2019-02-09 DIAGNOSIS — E1165 Type 2 diabetes mellitus with hyperglycemia: Secondary | ICD-10-CM | POA: Diagnosis not present

## 2019-03-13 DIAGNOSIS — H401134 Primary open-angle glaucoma, bilateral, indeterminate stage: Secondary | ICD-10-CM | POA: Diagnosis not present

## 2019-06-08 DIAGNOSIS — R2681 Unsteadiness on feet: Secondary | ICD-10-CM | POA: Diagnosis not present

## 2019-06-08 DIAGNOSIS — I69359 Hemiplegia and hemiparesis following cerebral infarction affecting unspecified side: Secondary | ICD-10-CM | POA: Diagnosis not present

## 2019-06-08 DIAGNOSIS — R8290 Unspecified abnormal findings in urine: Secondary | ICD-10-CM | POA: Diagnosis not present

## 2019-06-08 DIAGNOSIS — E1165 Type 2 diabetes mellitus with hyperglycemia: Secondary | ICD-10-CM | POA: Diagnosis not present

## 2019-06-08 DIAGNOSIS — F331 Major depressive disorder, recurrent, moderate: Secondary | ICD-10-CM | POA: Diagnosis not present

## 2019-06-08 DIAGNOSIS — F0390 Unspecified dementia without behavioral disturbance: Secondary | ICD-10-CM | POA: Diagnosis not present

## 2019-06-08 DIAGNOSIS — R400 Somnolence: Secondary | ICD-10-CM | POA: Diagnosis not present

## 2019-06-08 DIAGNOSIS — I10 Essential (primary) hypertension: Secondary | ICD-10-CM | POA: Diagnosis not present

## 2019-06-08 DIAGNOSIS — R5381 Other malaise: Secondary | ICD-10-CM | POA: Diagnosis not present

## 2019-06-08 DIAGNOSIS — R5383 Other fatigue: Secondary | ICD-10-CM | POA: Diagnosis not present

## 2019-06-15 DIAGNOSIS — M1712 Unilateral primary osteoarthritis, left knee: Secondary | ICD-10-CM | POA: Diagnosis not present

## 2019-06-15 DIAGNOSIS — Z7984 Long term (current) use of oral hypoglycemic drugs: Secondary | ICD-10-CM | POA: Diagnosis not present

## 2019-06-15 DIAGNOSIS — Z23 Encounter for immunization: Secondary | ICD-10-CM | POA: Diagnosis not present

## 2019-06-15 DIAGNOSIS — I10 Essential (primary) hypertension: Secondary | ICD-10-CM | POA: Diagnosis not present

## 2019-06-15 DIAGNOSIS — I69351 Hemiplegia and hemiparesis following cerebral infarction affecting right dominant side: Secondary | ICD-10-CM | POA: Diagnosis not present

## 2019-06-15 DIAGNOSIS — N390 Urinary tract infection, site not specified: Secondary | ICD-10-CM | POA: Diagnosis not present

## 2019-06-15 DIAGNOSIS — F0150 Vascular dementia without behavioral disturbance: Secondary | ICD-10-CM | POA: Diagnosis not present

## 2019-06-15 DIAGNOSIS — D649 Anemia, unspecified: Secondary | ICD-10-CM | POA: Diagnosis not present

## 2019-06-15 DIAGNOSIS — F339 Major depressive disorder, recurrent, unspecified: Secondary | ICD-10-CM | POA: Diagnosis not present

## 2019-06-15 DIAGNOSIS — E119 Type 2 diabetes mellitus without complications: Secondary | ICD-10-CM | POA: Diagnosis not present

## 2019-07-03 IMAGING — MR MR HEAD WO/W CM
9 of 12 series · 33 of 48 positions shown · IV contrast (multihance)
Comparison: Prior MRI from 04/19/2018.

CLINICAL DATA: Initial evaluation for intracranial infection, SANOSHI
encephalitis.

EXAM:
MRI HEAD WITHOUT AND WITH CONTRAST
TECHNIQUE: Multiplanar, multiecho pulse sequences of the brain and surrounding
structures were obtained without and with intravenous contrast.
CONTRAST:  19mL MULTIHANCE GADOBENATE DIMEGLUMINE 529 MG/ML IV SOLN

[Series 2: GRE · sagittal · 5.0mm · 0.45mm/px · 2 of 22 slices shown (1 of 3)]
[im 1/22]
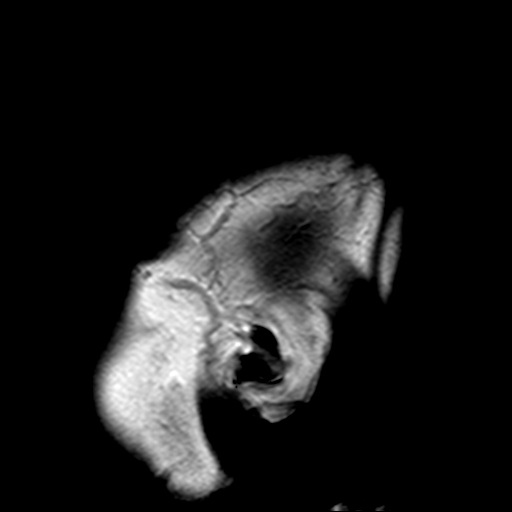
[im 22/22]
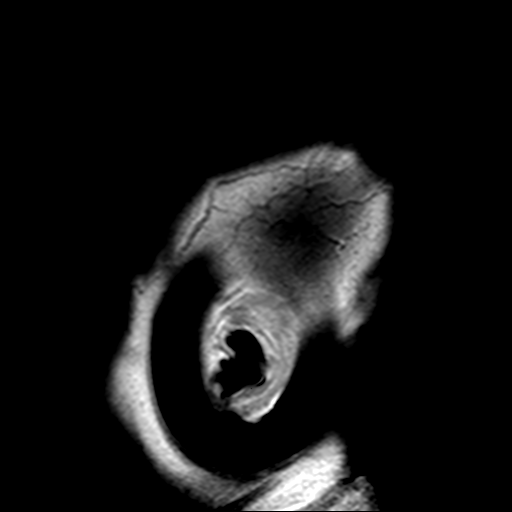

[Series 4: DWI · axial · 3.0mm · 1.80mm/px · z∈[-91,+55]mm · 6 of 50 slices shown (1 of 2)]
[im 1/50]
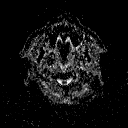
[im 10/50]
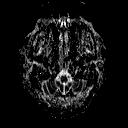
[im 20/50]
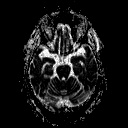
[im 30/50]
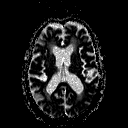
[im 40/50]
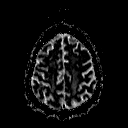
[im 50/50]
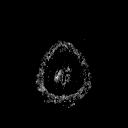

[Series 6: DWI · coronal · 3.0mm · 1.80mm/px · 5 of 44 slices shown (2 of 2)]
[im 1/44]
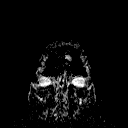
[im 11/44]
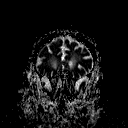
[im 22/44]
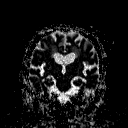
[im 33/44]
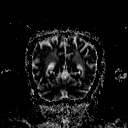
[im 44/44]
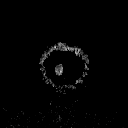

[Series 7: T2 · axial · 5.0mm · 0.45mm/px · z∈[-89,+53]mm · 3 of 23 slices shown (1 of 3)]
[im 1/23]
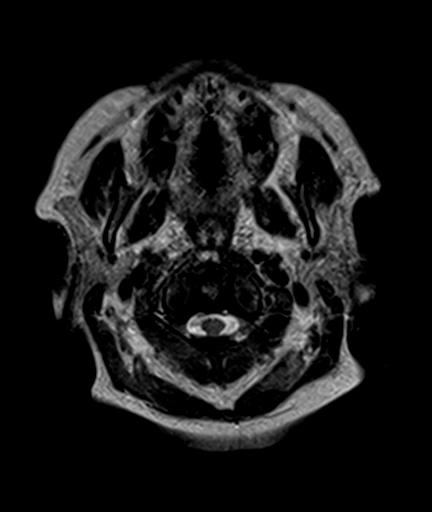
[im 12/23]
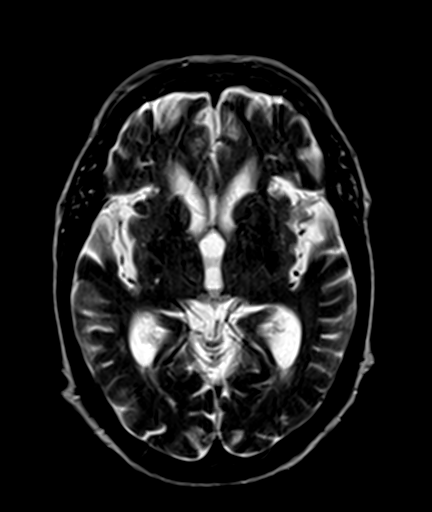
[im 23/23]
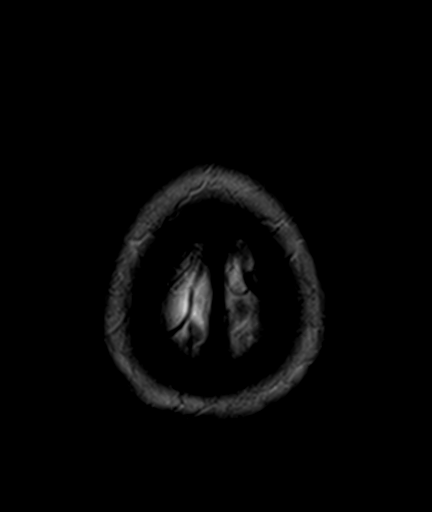

[Series 8: FLAIR · axial · 3.0mm · 0.45mm/px · z∈[-95,+60]mm · 6 of 53 slices shown]
[im 1/53]
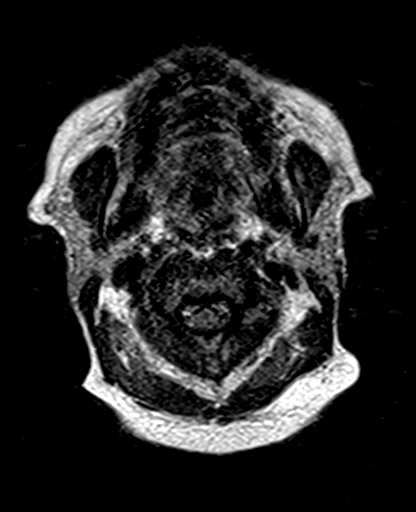
[im 11/53]
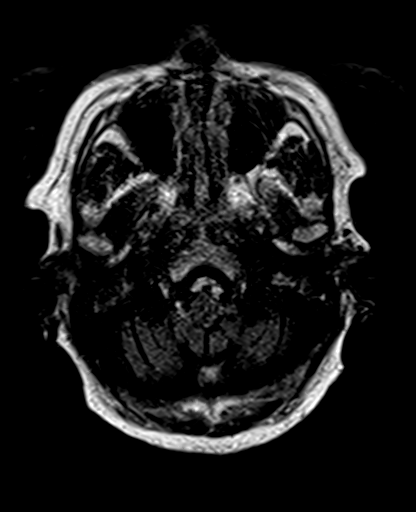
[im 21/53]
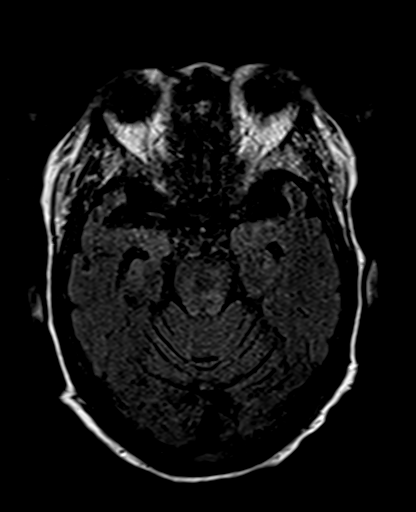
[im 32/53]
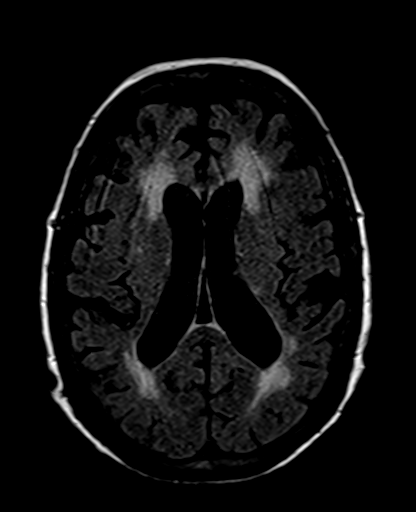
[im 42/53]
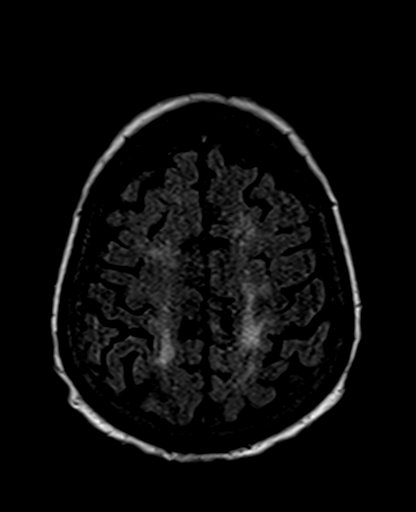
[im 53/53]
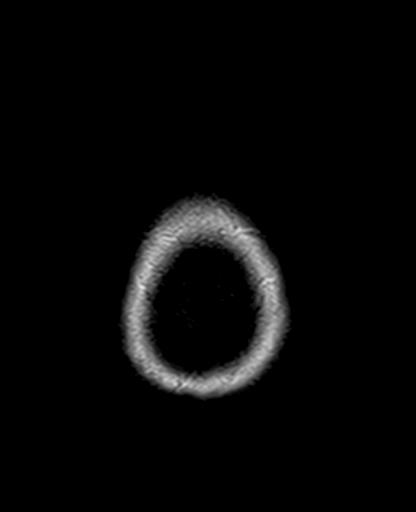

[Series 9: T2 · axial · 5.0mm · 1.20mm/px · z∈[-89,+53]mm · 3 of 23 slices shown (2 of 3)]
[im 1/23]
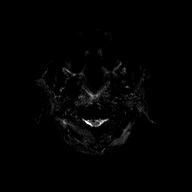
[im 12/23]
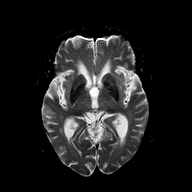
[im 23/23]
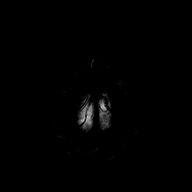

[Series 10: GRE · axial · 5.0mm · 0.45mm/px · z∈[-95,+60]mm · 3 of 25 slices shown (2 of 3)]
[im 1/25]
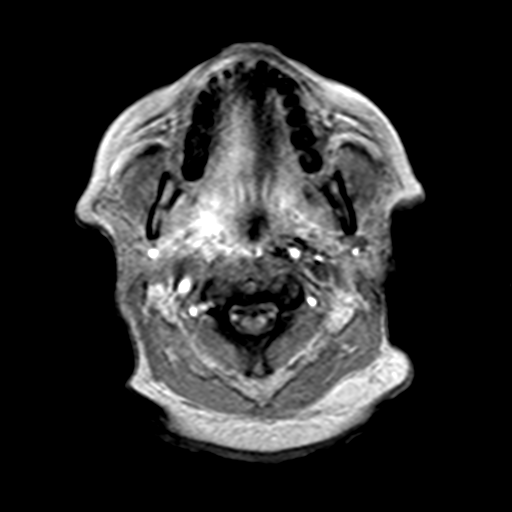
[im 13/25]
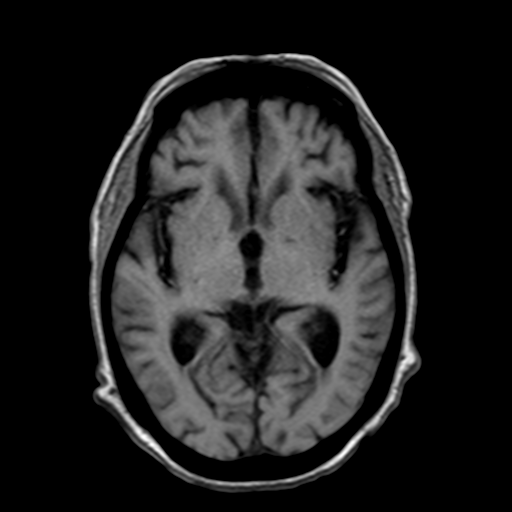
[im 25/25]
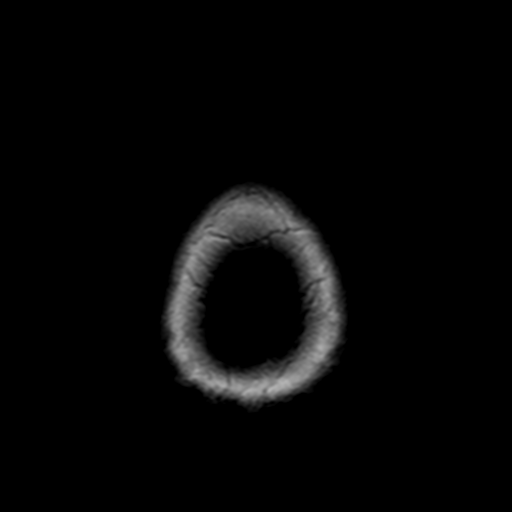

[Series 11: T2 · coronal · 5.0mm · 0.43mm/px · 3 of 27 slices shown (3 of 3)]
[im 1/27]
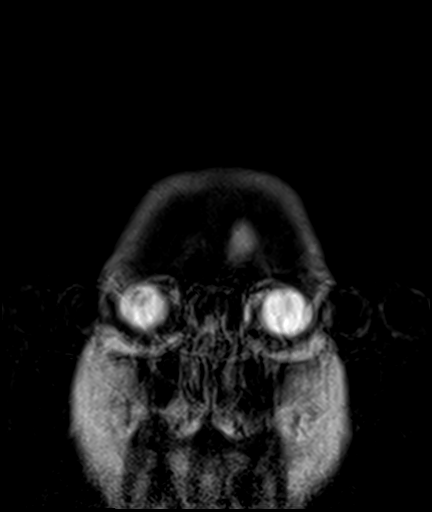
[im 14/27]
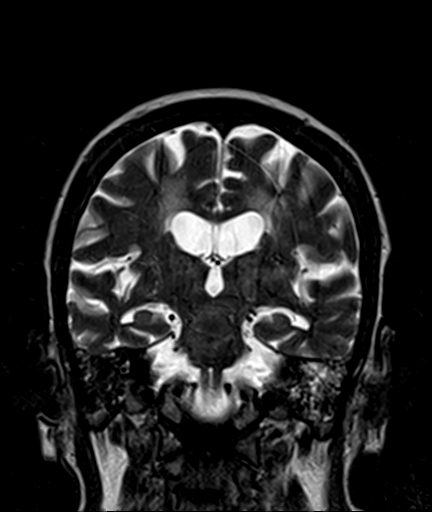
[im 27/27]
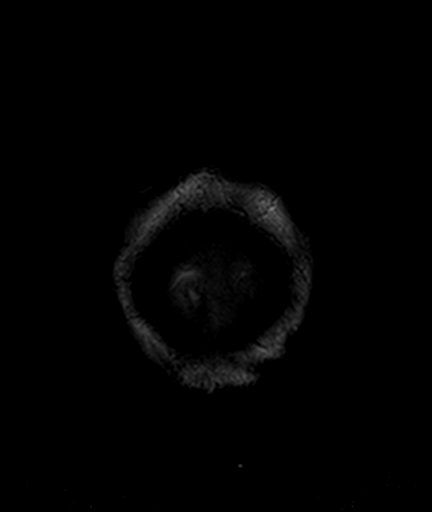

[Series 12: GRE · axial · 5.0mm · 0.45mm/px · z∈[-95,-18]mm · 2 of 25 slices shown (3 of 3)]
[im 1/25]
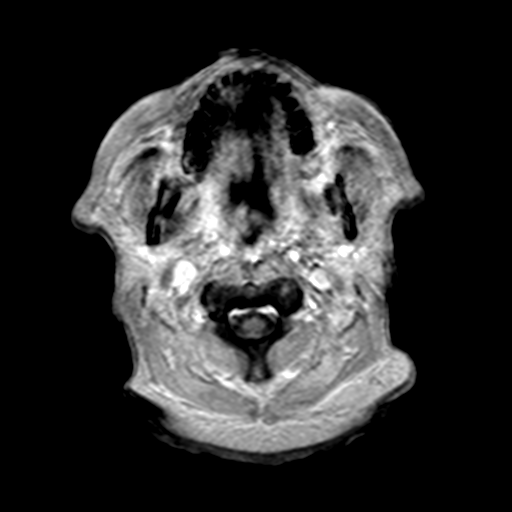
[im 13/25]
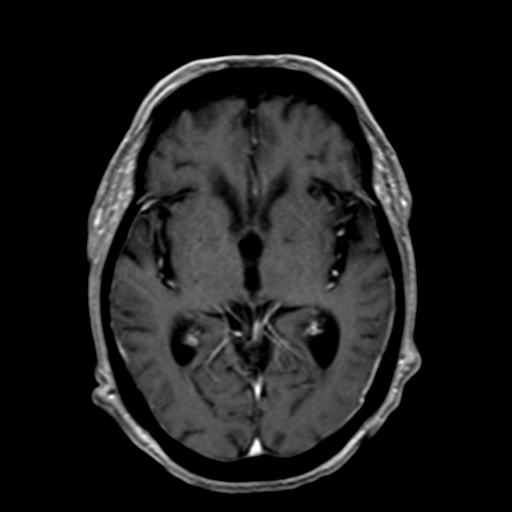

[33 of 48 positions shown; findings below may reference images not displayed]

FINDINGS: Brain: Study moderately degraded by motion artifact.

Generalized age-related cerebral atrophy. Confluent T2/FLAIR
hyperintensity within the periventricular and deep white matter both
cerebral hemispheres most consistent with chronic small vessel
ischemic disease, moderate nature, stable from previous.

No evidence for acute infarct. Gray-white matter differentiation
maintained. No acute or chronic intracranial hemorrhage.

No mass lesion, midline shift or mass effect. No hydrocephalus. No
extra-axial fluid collection. No abnormal enhancement. No imaging
findings to suggest acute encephalitis or meningitis.

Empty sella noted.

Vascular: Major intravascular flow voids are maintained

Skull and upper cervical spine: Craniocervical junction normal. No
focal marrow replacing lesion. No scalp soft tissue abnormality.

Sinuses/Orbits: Globes and orbital soft tissues within normal
limits. Paranasal sinuses are clear. Bilateral mastoid effusions,
left greater than right, similar to previous.

Other: None.
IMPRESSION: 1. No acute intracranial abnormality. No MRI evidence for acute SANOSHI
encephalitis or other intracranial infection.
2. Age-related cerebral atrophy with moderate chronic small vessel
ischemic disease.

## 2019-11-02 DIAGNOSIS — Z7984 Long term (current) use of oral hypoglycemic drugs: Secondary | ICD-10-CM | POA: Diagnosis not present

## 2019-11-02 DIAGNOSIS — I69351 Hemiplegia and hemiparesis following cerebral infarction affecting right dominant side: Secondary | ICD-10-CM | POA: Diagnosis not present

## 2019-11-02 DIAGNOSIS — F339 Major depressive disorder, recurrent, unspecified: Secondary | ICD-10-CM | POA: Diagnosis not present

## 2019-11-02 DIAGNOSIS — G2581 Restless legs syndrome: Secondary | ICD-10-CM | POA: Diagnosis not present

## 2019-11-02 DIAGNOSIS — F0150 Vascular dementia without behavioral disturbance: Secondary | ICD-10-CM | POA: Diagnosis not present

## 2019-11-02 DIAGNOSIS — F0280 Dementia in other diseases classified elsewhere without behavioral disturbance: Secondary | ICD-10-CM | POA: Diagnosis not present

## 2019-11-02 DIAGNOSIS — G309 Alzheimer's disease, unspecified: Secondary | ICD-10-CM | POA: Diagnosis not present

## 2019-11-02 DIAGNOSIS — I10 Essential (primary) hypertension: Secondary | ICD-10-CM | POA: Diagnosis not present

## 2019-11-02 DIAGNOSIS — E119 Type 2 diabetes mellitus without complications: Secondary | ICD-10-CM | POA: Diagnosis not present

## 2019-12-22 ENCOUNTER — Other Ambulatory Visit: Payer: Self-pay

## 2019-12-22 ENCOUNTER — Inpatient Hospital Stay
Admission: EM | Admit: 2019-12-22 | Discharge: 2019-12-26 | DRG: 072 | Disposition: A | Discharge status: Hospice | Payer: Medicare HMO | Attending: Internal Medicine | Admitting: Internal Medicine

## 2019-12-22 ENCOUNTER — Emergency Department: Payer: Medicare HMO

## 2019-12-22 ENCOUNTER — Observation Stay: Payer: Medicare HMO

## 2019-12-22 ENCOUNTER — Encounter: Payer: Self-pay | Admitting: Emergency Medicine

## 2019-12-22 DIAGNOSIS — E876 Hypokalemia: Secondary | ICD-10-CM

## 2019-12-22 DIAGNOSIS — R7301 Impaired fasting glucose: Secondary | ICD-10-CM | POA: Diagnosis not present

## 2019-12-22 DIAGNOSIS — Z6829 Body mass index (BMI) 29.0-29.9, adult: Secondary | ICD-10-CM | POA: Diagnosis not present

## 2019-12-22 DIAGNOSIS — Z86718 Personal history of other venous thrombosis and embolism: Secondary | ICD-10-CM

## 2019-12-22 DIAGNOSIS — R4182 Altered mental status, unspecified: Secondary | ICD-10-CM

## 2019-12-22 DIAGNOSIS — Z515 Encounter for palliative care: Secondary | ICD-10-CM | POA: Diagnosis not present

## 2019-12-22 DIAGNOSIS — Z7901 Long term (current) use of anticoagulants: Secondary | ICD-10-CM

## 2019-12-22 DIAGNOSIS — I10 Essential (primary) hypertension: Secondary | ICD-10-CM | POA: Diagnosis present

## 2019-12-22 DIAGNOSIS — R569 Unspecified convulsions: Secondary | ICD-10-CM

## 2019-12-22 DIAGNOSIS — F0150 Vascular dementia without behavioral disturbance: Secondary | ICD-10-CM | POA: Diagnosis not present

## 2019-12-22 DIAGNOSIS — I361 Nonrheumatic tricuspid (valve) insufficiency: Secondary | ICD-10-CM | POA: Diagnosis not present

## 2019-12-22 DIAGNOSIS — Z8673 Personal history of transient ischemic attack (TIA), and cerebral infarction without residual deficits: Secondary | ICD-10-CM | POA: Diagnosis not present

## 2019-12-22 DIAGNOSIS — G9341 Metabolic encephalopathy: Secondary | ICD-10-CM | POA: Diagnosis not present

## 2019-12-22 DIAGNOSIS — Z20822 Contact with and (suspected) exposure to covid-19: Secondary | ICD-10-CM | POA: Diagnosis present

## 2019-12-22 DIAGNOSIS — R627 Adult failure to thrive: Secondary | ICD-10-CM | POA: Diagnosis not present

## 2019-12-22 DIAGNOSIS — Z79899 Other long term (current) drug therapy: Secondary | ICD-10-CM

## 2019-12-22 DIAGNOSIS — S01511A Laceration without foreign body of lip, initial encounter: Secondary | ICD-10-CM | POA: Diagnosis not present

## 2019-12-22 DIAGNOSIS — R410 Disorientation, unspecified: Secondary | ICD-10-CM

## 2019-12-22 DIAGNOSIS — F17290 Nicotine dependence, other tobacco product, uncomplicated: Secondary | ICD-10-CM | POA: Diagnosis present

## 2019-12-22 DIAGNOSIS — E119 Type 2 diabetes mellitus without complications: Secondary | ICD-10-CM

## 2019-12-22 DIAGNOSIS — Y92239 Unspecified place in hospital as the place of occurrence of the external cause: Secondary | ICD-10-CM | POA: Diagnosis not present

## 2019-12-22 DIAGNOSIS — I6523 Occlusion and stenosis of bilateral carotid arteries: Secondary | ICD-10-CM | POA: Diagnosis not present

## 2019-12-22 DIAGNOSIS — G9340 Encephalopathy, unspecified: Secondary | ICD-10-CM | POA: Diagnosis not present

## 2019-12-22 DIAGNOSIS — Z7189 Other specified counseling: Secondary | ICD-10-CM | POA: Diagnosis not present

## 2019-12-22 DIAGNOSIS — Z66 Do not resuscitate: Secondary | ICD-10-CM | POA: Diagnosis present

## 2019-12-22 DIAGNOSIS — Z8249 Family history of ischemic heart disease and other diseases of the circulatory system: Secondary | ICD-10-CM

## 2019-12-22 DIAGNOSIS — Z03818 Encounter for observation for suspected exposure to other biological agents ruled out: Secondary | ICD-10-CM | POA: Diagnosis not present

## 2019-12-22 LAB — CBC WITH DIFFERENTIAL/PLATELET
Abs Immature Granulocytes: 0.05 10*3/uL (ref 0.00–0.07)
Basophils Absolute: 0 10*3/uL (ref 0.0–0.1)
Basophils Relative: 0 %
Eosinophils Absolute: 0 10*3/uL (ref 0.0–0.5)
Eosinophils Relative: 0 %
HCT: 39.6 % (ref 36.0–46.0)
Hemoglobin: 12.6 g/dL (ref 12.0–15.0)
Immature Granulocytes: 1 %
Lymphocytes Relative: 10 %
Lymphs Abs: 0.9 10*3/uL (ref 0.7–4.0)
MCH: 24.8 pg — ABNORMAL LOW (ref 26.0–34.0)
MCHC: 31.8 g/dL (ref 30.0–36.0)
MCV: 78 fL — ABNORMAL LOW (ref 80.0–100.0)
Monocytes Absolute: 0.5 10*3/uL (ref 0.1–1.0)
Monocytes Relative: 5 %
Neutro Abs: 7.7 10*3/uL (ref 1.7–7.7)
Neutrophils Relative %: 84 %
Platelets: 190 10*3/uL (ref 150–400)
RBC: 5.08 MIL/uL (ref 3.87–5.11)
RDW: 13.8 % (ref 11.5–15.5)
WBC: 9.2 10*3/uL (ref 4.0–10.5)
nRBC: 0 % (ref 0.0–0.2)

## 2019-12-22 LAB — COMPREHENSIVE METABOLIC PANEL
ALT: 27 U/L (ref 0–44)
AST: 29 U/L (ref 15–41)
Albumin: 3.8 g/dL (ref 3.5–5.0)
Alkaline Phosphatase: 42 U/L (ref 38–126)
Anion gap: 12 (ref 5–15)
BUN: 21 mg/dL (ref 8–23)
CO2: 21 mmol/L — ABNORMAL LOW (ref 22–32)
Calcium: 9.4 mg/dL (ref 8.9–10.3)
Chloride: 103 mmol/L (ref 98–111)
Creatinine, Ser: 0.99 mg/dL (ref 0.44–1.00)
GFR calc Af Amer: >60 mL/min (ref >60)
GFR calc non Af Amer: 53 mL/min — ABNORMAL LOW (ref >60)
Glucose, Bld: 188 mg/dL — ABNORMAL HIGH (ref 70–99)
Potassium: 4.2 mmol/L (ref 3.5–5.1)
Sodium: 136 mmol/L (ref 135–145)
Total Bilirubin: 0.8 mg/dL (ref 0.3–1.2)
Total Protein: 6.3 g/dL — ABNORMAL LOW (ref 6.5–8.1)

## 2019-12-22 LAB — TROPONIN I (HIGH SENSITIVITY)
Troponin I (High Sensitivity): 7 ng/L (ref <18)
Troponin I (High Sensitivity): 10 ng/L (ref <18)

## 2019-12-22 LAB — URINALYSIS, COMPLETE (UACMP) WITH MICROSCOPIC
Bacteria, UA: NONE SEEN
Bilirubin Urine: NEGATIVE
Glucose, UA: 50 mg/dL — AB
Hgb urine dipstick: NEGATIVE
Ketones, ur: 20 mg/dL — AB
Leukocytes,Ua: NEGATIVE
Nitrite: NEGATIVE
Protein, ur: NEGATIVE mg/dL
Specific Gravity, Urine: 1.01 (ref 1.005–1.030)
Squamous Epithelial / LPF: NONE SEEN (ref 0–5)
pH: 7 (ref 5.0–8.0)

## 2019-12-22 LAB — GLUCOSE, CAPILLARY: Glucose-Capillary: 105 mg/dL — ABNORMAL HIGH (ref 70–99)

## 2019-12-22 LAB — PROTIME-INR
INR: 0.9 (ref 0.8–1.2)
Prothrombin Time: 12 seconds (ref 11.4–15.2)

## 2019-12-22 MED ORDER — ASPIRIN 325 MG PO TABS
325.0000 mg | ORAL_TABLET | Freq: Every day | ORAL | Status: DC
  Filled 2019-12-22 (×5): qty 1

## 2019-12-22 MED ORDER — LOSARTAN POTASSIUM 50 MG PO TABS: 50.0000 mg | ORAL_TABLET | Freq: Every day | ORAL | Status: DC

## 2019-12-22 MED ORDER — ACETAMINOPHEN 160 MG/5ML PO SOLN
650.0000 mg | ORAL | Status: DC | PRN
  Filled 2019-12-22: qty 20.3

## 2019-12-22 MED ORDER — ACETAMINOPHEN 325 MG PO TABS: 650.0000 mg | ORAL_TABLET | Freq: Four times a day (QID) | ORAL | Status: DC | PRN

## 2019-12-22 MED ORDER — METOPROLOL TARTRATE 25 MG PO TABS: 25.0000 mg | ORAL_TABLET | Freq: Two times a day (BID) | ORAL | Status: DC

## 2019-12-22 MED ORDER — MAGNESIUM OXIDE 400 (241.3 MG) MG PO TABS: 800.0000 mg | ORAL_TABLET | Freq: Two times a day (BID) | ORAL | Status: DC

## 2019-12-22 MED ORDER — MIDAZOLAM HCL 2 MG/2ML IJ SOLN: 1.0000 mg | Freq: Once | INTRAMUSCULAR | Status: DC

## 2019-12-22 MED ORDER — ACETAMINOPHEN 325 MG PO TABS: 650.0000 mg | ORAL_TABLET | ORAL | Status: DC | PRN

## 2019-12-22 MED ORDER — ESCITALOPRAM OXALATE 10 MG PO TABS
20.0000 mg | ORAL_TABLET | Freq: Every day | ORAL | Status: DC
  Filled 2019-12-22 (×4): qty 2

## 2019-12-22 MED ORDER — FUROSEMIDE 40 MG PO TABS: 40.0000 mg | ORAL_TABLET | Freq: Every day | ORAL | Status: DC

## 2019-12-22 MED ORDER — SODIUM CHLORIDE 0.9 % IV SOLN: INTRAVENOUS | Status: DC

## 2019-12-22 MED ORDER — ENOXAPARIN SODIUM 40 MG/0.4ML ~~LOC~~ SOLN
40.0000 mg | SUBCUTANEOUS | Status: DC
  Administered 2019-12-22 – 2019-12-25 (×4): 40 mg via SUBCUTANEOUS
  Filled 2019-12-22 (×4): qty 0.4

## 2019-12-22 MED ORDER — TRAZODONE HCL 50 MG PO TABS: 50.0000 mg | ORAL_TABLET | Freq: Every day | ORAL | Status: DC

## 2019-12-22 MED ORDER — ASPIRIN 300 MG RE SUPP
300.0000 mg | Freq: Every day | RECTAL | Status: DC
  Administered 2019-12-24: 300 mg via RECTAL
  Filled 2019-12-22 (×7): qty 1

## 2019-12-22 MED ORDER — OMEGA-3-ACID ETHYL ESTERS 1 G PO CAPS
1.0000 g | ORAL_CAPSULE | Freq: Two times a day (BID) | ORAL | Status: DC
  Filled 2019-12-22: qty 1

## 2019-12-22 MED ORDER — ENSURE ENLIVE PO LIQD: 237.0000 mL | Freq: Three times a day (TID) | ORAL | Status: DC

## 2019-12-22 MED ORDER — ADULT MULTIVITAMIN W/MINERALS CH: 1.0000 | ORAL_TABLET | Freq: Every day | ORAL | Status: DC

## 2019-12-22 MED ORDER — ACETAMINOPHEN 650 MG RE SUPP: 650.0000 mg | RECTAL | Status: DC | PRN

## 2019-12-22 MED ORDER — LORAZEPAM 2 MG/ML IJ SOLN
1.0000 mg | Freq: Once | INTRAMUSCULAR | Status: AC
  Administered 2019-12-23: 1 mg via INTRAVENOUS
  Filled 2019-12-22: qty 1

## 2019-12-22 MED ORDER — SENNOSIDES-DOCUSATE SODIUM 8.6-50 MG PO TABS: 1.0000 | ORAL_TABLET | Freq: Every evening | ORAL | Status: DC | PRN

## 2019-12-22 MED ORDER — STROKE: EARLY STAGES OF RECOVERY BOOK: Freq: Once | Status: DC

## 2019-12-22 NOTE — ED Provider Notes (Signed)
Medical screening examination/treatment/procedure(s) were conducted as a shared visit with non-physician practitioner(s) and myself.  I personally evaluated the patient during the encounter.    This evaluated the patient in conjunction with her nurse practitioner.  Patient resting, seems slightly delirious occasionally picking at things.  Very nonfocal exam.  In no acute distress however.  Family including patient's daughter at the bedside.  Labs reviewed as well as CT of the head.   Sharyn Creamer, MD 12/22/19 [phone]

## 2019-12-22 NOTE — H&P (Signed)
[place] at [facility]   PATIENT NAME: Natalie Knight    MR#:  [id number]  DATE OF BIRTH:  1935-10-28  DATE OF ADMISSION:  12/22/2019  PRIMARY CARE PHYSICIAN: Barbette Reichmann, MD   REQUESTING/REFERRING PHYSICIAN: Sharyn Creamer, MD  Patient comes from home.  Per her daughter who is at bedside she is fairly independent  CHIEF COMPLAINT:   Chief Complaint  Patient presents with  . Altered Mental Status    HISTORY OF PRESENT ILLNESS:  Natalie Knight  is a 84 y.o. female with a known history of diabetes mellitus, hypertension, vascular dementia, previous CVA, left DVT on Eliquis is being admitted for acute metabolic encephalopathy. pt was brought in from home via acems with c/o change in LOC according to daughter. pt baseline has dementia, but family reports pt has been more lethargic than normal. Pt only oriented to self at this time.CBG 205. Pt recently started on nemenda 1 week ago according to her daughter and that may have contributed to her lethargy per her.  According to daughter patient is fairly independent and lives on her own. PAST MEDICAL HISTORY:   Past Medical History:  Diagnosis Date  . Diabetes mellitus without complication (HCC)   . Hypertension    PAST SURGICAL HISTORY:  History reviewed. No pertinent surgical history. SOCIAL HISTORY:   Social History   Tobacco Use  . Smoking status: Never Smoker  . Smokeless tobacco: Current User    Types: Snuff  Substance Use Topics  . Alcohol use: No   FAMILY HISTORY:  History reviewed. No pertinent family history.  HTN in all children DRUG ALLERGIES:  No Known Allergies REVIEW OF SYSTEMS:  ROS unable to obtain due to her mental status MEDICATIONS AT HOME:   Prior to Admission medications   Medication Sig Start Date End Date Taking? Authorizing Provider  acetaminophen (TYLENOL) 325 MG tablet Take 2 tablets (650 mg total) by mouth every 6 (six) hours as needed for mild pain, moderate pain, fever or  headache (headache). 05/06/18   Enid Baas, MD  escitalopram (LEXAPRO) 20 MG tablet Take 1 tablet by mouth daily. 03/20/18   [provider]  feeding supplement, ENSURE ENLIVE, (ENSURE ENLIVE) LIQD Take 237 mLs by mouth 3 (three) times daily between meals. 05/06/18   Enid Baas, MD  furosemide (LASIX) 40 MG tablet Take 1 tablet (40 mg total) by mouth daily. 05/07/18   Enid Baas, MD  losartan (COZAAR) 50 MG tablet Take 1 tablet (50 mg total) by mouth daily. 05/07/18   Enid Baas, MD  magnesium oxide (MAG-OX) 400 (241.3 Mg) MG tablet Take 2 tablets (800 mg total) by mouth 2 (two) times daily. 05/07/18   Enid Baas, MD  metoprolol tartrate (LOPRESSOR) 25 MG tablet Take 1 tablet (25 mg total) by mouth 2 (two) times daily. 05/06/18 07/05/18  Enid Baas, MD  Multiple Vitamin (MULTI-VITAMINS) TABS Take 1 tablet by mouth daily.    [provider]  omega-3 acid ethyl esters (LOVAZA) 1 g capsule Take 1 g by mouth 2 (two) times daily.    [provider]  potassium chloride SA (K-DUR,KLOR-CON) 20 MEQ tablet Take 2 tablets (40 mEq total) by mouth daily. While on lasix 05/07/18   Enid Baas, MD  traZODone (DESYREL) 50 MG tablet Take 1 tablet (50 mg total) by mouth at bedtime. 05/06/18   Enid Baas, MD    VITAL SIGNS:  Blood pressure (!) 181/90, pulse 78, temperature 97.8 F (36.6 C), temperature source Axillary, resp. rate  18, weight 79.6 kg, SpO2 97 %. PHYSICAL EXAMINATION:  Physical Exam HENT:     Head: Normocephalic and atraumatic.  Eyes:     Conjunctiva/sclera: Conjunctivae normal.     Pupils: Pupils are equal, round, and reactive to light.  Neck:     Thyroid: No thyromegaly.     Trachea: No tracheal deviation.  Cardiovascular:     Rate and Rhythm: Normal rate and regular rhythm.     Heart sounds: Normal heart sounds.  Pulmonary:     Effort: Pulmonary effort is normal. No respiratory distress.     Breath sounds:  Normal breath sounds. No wheezing.  Chest:     Chest wall: No tenderness.  Abdominal:     General: Bowel sounds are normal. There is no distension.     Palpations: Abdomen is soft.     Tenderness: There is no abdominal tenderness.  Musculoskeletal:        General: Normal range of motion.     Cervical back: Normal range of motion and neck supple.  Skin:    General: Skin is warm and dry.     Findings: No rash.  Neurological:     Mental Status: She is lethargic, disoriented and confused.     Cranial Nerves: No cranial nerve deficit.     Comments: Drowsy and awakens only to tactile stimuli   Psychiatric:     Comments: Unable to assess due to her mental status    LABORATORY PANEL:   CBC Recent Labs  Lab 12/22/19 1212  WBC 9.2  HGB 12.6  HCT 39.6  PLT 190   ------------------------------------------------------------------------------------------------------------------  Chemistries  Recent Labs  Lab 12/22/19 1212  NA 136  K 4.2  CL 103  CO2 21*  GLUCOSE 188*  BUN 21  CREATININE 0.99  CALCIUM 9.4  AST 29  ALT 27  ALKPHOS 42  BILITOT 0.8   ------------------------------------------------------------------------------------------------------------------  Cardiac Enzymes No results for input(s): TROPONINI in the last 168 hours. ------------------------------------------------------------------------------------------------------------------  RADIOLOGY:  CT Head Wo Contrast  Result Date: 12/22/2019 CLINICAL DATA:  Altered mental status. Baseline dementia. EXAM: CT HEAD WITHOUT CONTRAST TECHNIQUE: Contiguous axial images were obtained from the base of the skull through the vertex without intravenous contrast. COMPARISON:  CT head 04/18/2018. MRI brain 04/28/2018. FINDINGS: Despite efforts by the technologist and patient, mild motion artifact is present on today's exam and could not be eliminated. This reduces exam sensitivity and specificity. Multiple images were  repeated. Brain: There is no evidence of acute intracranial hemorrhage, mass lesion, brain edema or extra-axial fluid collection. There is generalized atrophy with prominence of the ventricles and subarachnoid spaces. There is confluent low-density in the periventricular and subcortical white matter which is mildly progressive, and most consistent with chronic small vessel ischemic changes. There is no CT evidence of acute cortical infarction. Vascular: Intracranial vascular calcifications. No hyperdense vessel identified. Skull: Negative for fracture or focal lesion. Sinuses/Orbits: The visualized paranasal sinuses and mastoid air cells are clear. No orbital abnormalities are seen. Other: None. IMPRESSION: 1. No acute intracranial findings. 2. Atrophy with mildly progressive chronic small vessel ischemic changes in the periventricular and subcortical white matter. Electronically Signed   By: Carey Bullocks M.D.   On: 12/22/2019 13:49   IMPRESSION AND PLAN:  84 year old female known history of diabetes mellitus, hypertension, vascular dementia, previous CVA, left DVT on Eliquis is being admitted for acute metabolic encephalopathy.   *Acute metabolic encephalopathy -We will rule out CVA considering her history -Check carotid Dopplers, echo,  MRI of the brain -This certainly could be progression of her dementia and or side effect of Namenda -Hold Namenda for now -TSH, hemoglobin A1c  *Diabetes mellitus Sliding scale insulin for now, check hemoglobin A1c  *Hypertension -Continue home medications  *Dementia Hold Namenda for now, outpatient neurology follow-up   Status is: Observation  The patient remains OBS appropriate and will d/c before 2 midnights.  Dispo: The patient is from: Home              Anticipated d/c is to: Home              Anticipated d/c date is: 2 days              Patient currently is not medically stable to d/c.   All the records are reviewed and case discussed with ED  provider. Management plans discussed with the patient, family (daughter at bedside) and they are in agreement.  CODE STATUS: DNR  TOTAL TIME TAKING CARE OF THIS PATIENT: 45 minutes.    Max Sane M.D on 12/22/2019 at 4:16 PM  [facility]   CC: Primary care physician; Tracie Harrier, MD   Note: This dictation was prepared with Dragon dictation along with smaller phrase technology. Any transcriptional errors that result from this process are unintentional.

## 2019-12-22 NOTE — ED Provider Notes (Signed)
[facility] Emergency Department Provider Note ____________________________________________   First MD Initiated Contact with Patient 12/22/19 1227     (approximate)  I have reviewed the triage vital signs and the nursing notes.   HISTORY  Chief Complaint Altered Mental Status  HPI Natalie Knight is a 84 y.o. female with history of dementia, diabetes, hypertension who presents to the emergency department for evaluation of altered mental status.  Her daughter is at bedside and states that she lives alone but is usually able to perform ADLs.  She typically walks with a walker.  The daughter goes over every day make sure that she is taking her medication and that she is eating.  She states that she last spoke with her around 4:00 yesterday afternoon she was fine.  This morning when she went patient did not want to get out of bed.  When she did get her up she hurriedly walked to the bathroom without her walker.  Daughter noticed that there was urine in the floor.  Daughter also states that she sat down at the table and looked at her medications but was unable to remember how to take them.  Daughter states that she has been "talking out of her head all day."  Daughter states that she has had these symptoms in the past that was related to a CVA.  Patient very restless and confused.        Past Medical History:  Diagnosis Date  . Diabetes mellitus without complication ([place])   . Hypertension     Patient Active Problem List   Diagnosis Date Noted  . Bacterial meningitis   . Sepsis ([place])   . Anorexia   . Goals of care, counseling/discussion   . Palliative care by specialist   . Meningitis 04/18/2018    History reviewed. No pertinent surgical history.  Prior to Admission medications   Medication Sig Start Date End Date Taking? Authorizing Provider  acetaminophen (TYLENOL) 325 MG tablet Take 2 tablets (650 mg total) by mouth every 6 (six) hours as needed for mild  pain, moderate pain, fever or headache (headache). 05/06/18   Gladstone Lighter, MD  escitalopram (LEXAPRO) 20 MG tablet Take 1 tablet by mouth daily. 03/20/18   [provider]  feeding supplement, ENSURE ENLIVE, (ENSURE ENLIVE) LIQD Take 237 mLs by mouth 3 (three) times daily between meals. 05/06/18   Gladstone Lighter, MD  furosemide (LASIX) 40 MG tablet Take 1 tablet (40 mg total) by mouth daily. 05/07/18   Gladstone Lighter, MD  losartan (COZAAR) 50 MG tablet Take 1 tablet (50 mg total) by mouth daily. 05/07/18   Gladstone Lighter, MD  magnesium oxide (MAG-OX) 400 (241.3 Mg) MG tablet Take 2 tablets (800 mg total) by mouth 2 (two) times daily. 05/07/18   Gladstone Lighter, MD  metoprolol tartrate (LOPRESSOR) 25 MG tablet Take 1 tablet (25 mg total) by mouth 2 (two) times daily. 05/06/18 07/05/18  Gladstone Lighter, MD  Multiple Vitamin (MULTI-VITAMINS) TABS Take 1 tablet by mouth daily.    [provider]  omega-3 acid ethyl esters (LOVAZA) 1 g capsule Take 1 g by mouth 2 (two) times daily.    [provider]  potassium chloride SA (K-DUR,KLOR-CON) 20 MEQ tablet Take 2 tablets (40 mEq total) by mouth daily. While on lasix 05/07/18   Gladstone Lighter, MD  traZODone (DESYREL) 50 MG tablet Take 1 tablet (50 mg total) by mouth at bedtime. 05/06/18   Gladstone Lighter, MD    Allergies  Patient has no known allergies.  History reviewed. No pertinent family history.  Social History Social History   Tobacco Use  . Smoking status: Never Smoker  . Smokeless tobacco: Current User    Types: Snuff  Substance Use Topics  . Alcohol use: No  . Drug use: Not on file    Review of Systems  Level 5 caveat: Altered mental status ____________________________________________   PHYSICAL EXAM:  VITAL SIGNS: ED Triage Vitals [12/22/19 1210]  Enc Vitals Group     BP (!) 196/102     Pulse Rate 78     Resp 16     Temp 97.8 F (36.6 C)     Temp Source Axillary     SpO2  97 %     Weight 175 lb 7.8 oz (79.6 kg)     Height      Head Circumference      Peak Flow      Pain Score      Pain Loc      Pain Edu?      Excl. in GC?     Constitutional: Oriented to person.  No acute distress. Eyes: Conjunctivae are clear l. PERRL.  Pupils 2 mm equal  head: Atraumatic. Nose: No congestion/rhinnorhea. Mouth/Throat: Mucous membranes are moist.  Oropharynx non-erythematous. Neck: No stridor.   Hematological/Lymphatic/Immunilogical: No cervical lymphadenopathy. Cardiovascular: Normal rate, regular rhythm. Grossly normal heart sounds.  Good peripheral circulation. Respiratory: Normal respiratory effort.  No retractions. Lungs CTAB. Gastrointestinal: Soft.  No guarding. No distention. No abdominal bruits. No CVA tenderness. Genitourinary:  Musculoskeletal: No lower extremity edema.  No joint effusions. Neurologic: Confused.  Speech clear.  No focal neurologic deficits.   Skin:  Skin is warm, dry and intact. No rash noted. Psychiatric: Mood and affect are normal. Speech and behavior are normal.  ____________________________________________   LABS (all labs ordered are listed, but only abnormal results are displayed)  Labs Reviewed  URINALYSIS, COMPLETE (UACMP) WITH MICROSCOPIC - Abnormal; Notable for the following components:      Result Value   Color, Urine STRAW (*)    APPearance CLEAR (*)    Glucose, UA 50 (*)    Ketones, ur 20 (*)    All other components within normal limits  CBC WITH DIFFERENTIAL/PLATELET - Abnormal; Notable for the following components:   MCV 78.0 (*)    MCH 24.8 (*)    All other components within normal limits  COMPREHENSIVE METABOLIC PANEL - Abnormal; Notable for the following components:   CO2 21 (*)    Glucose, Bld 188 (*)    Total Protein 6.3 (*)    GFR calc non Af Amer 53 (*)    All other components within normal limits  SARS CORONAVIRUS 2 (TAT 6-24 HRS)  PROTIME-INR  CBC WITH DIFFERENTIAL/PLATELET  TROPONIN I (HIGH  SENSITIVITY)  TROPONIN I (HIGH SENSITIVITY)   ____________________________________________  EKG  ED ECG REPORT I, Rakin Lemelle, FNP-BC personally viewed and interpreted this ECG.   Date: 12/22/2019  Rate: 76  Rhythm: normal sinus rhythm, PAC's noted  Axis: normal  Intervals:none  ST&T Change: no ST elevation  ____________________________________________  RADIOLOGY  ED MD interpretation:    Chest x-ray negative for acute cardiopulmonary abnormality.  I, Kem Boroughs, personally viewed and evaluated these images (plain radiographs) as part of my medical decision making, as well as reviewing the written report by the radiologist.  CT head shows no acute findings per radiology.  Official radiology report(s): CT Head Wo Contrast  Result Date: 12/22/2019 CLINICAL DATA:  Altered mental status. Baseline dementia. EXAM: CT HEAD WITHOUT CONTRAST TECHNIQUE: Contiguous axial images were obtained from the base of the skull through the vertex without intravenous contrast. COMPARISON:  CT head 04/18/2018. MRI brain 04/28/2018. FINDINGS: Despite efforts by the technologist and patient, mild motion artifact is present on today's exam and could not be eliminated. This reduces exam sensitivity and specificity. Multiple images were repeated. Brain: There is no evidence of acute intracranial hemorrhage, mass lesion, brain edema or extra-axial fluid collection. There is generalized atrophy with prominence of the ventricles and subarachnoid spaces. There is confluent low-density in the periventricular and subcortical white matter which is mildly progressive, and most consistent with chronic small vessel ischemic changes. There is no CT evidence of acute cortical infarction. Vascular: Intracranial vascular calcifications. No hyperdense vessel identified. Skull: Negative for fracture or focal lesion. Sinuses/Orbits: The visualized paranasal sinuses and mastoid air cells are clear. No orbital abnormalities  are seen. Other: None. IMPRESSION: 1. No acute intracranial findings. 2. Atrophy with mildly progressive chronic small vessel ischemic changes in the periventricular and subcortical white matter. Electronically Signed   By: Carey Bullocks M.D.   On: 12/22/2019 13:49    ____________________________________________   PROCEDURES  Procedure(s) performed (including Critical Care):  Procedures  ____________________________________________   INITIAL IMPRESSION / ASSESSMENT AND PLAN     84 year old female presenting to the emergency department for treatment and evaluation of altered mental status.  Last known well around 4 PM yesterday afternoon.  With the exception of confusion, her exam is overall reassuring.  Plan will be to review lab studies, get a urinalysis, and head CT.  DIFFERENTIAL DIAGNOSIS  TIA, CVA, electrolyte imbalance, dementia, ICH  ED COURSE  Lab studies reassuring.  No indication of acute cystitis.  CT scan also reassuring and is not indicating any acute CVA.  Patient remains confused.  She will be admitted for further work-up for altered mental status.  Dr. Sherryll Burger has accepted her for admission ____________________________________________   FINAL CLINICAL IMPRESSION(S) / ED DIAGNOSES  Final diagnoses:  Disorientation     ED Discharge Orders    None       Natalie Knight was evaluated in Emergency Department on 12/22/2019 for the symptoms described in the history of present illness. She was evaluated in the context of the global COVID-19 pandemic, which necessitated consideration that the patient might be at risk for infection with the SARS-CoV-2 virus that causes COVID-19. Institutional protocols and algorithms that pertain to the evaluation of patients at risk for COVID-19 are in a state of rapid change based on information released by regulatory bodies including the CDC and federal and state organizations. These policies and algorithms were followed during the  patient's care in the ED.   Note:  This document was prepared using Dragon voice recognition software and may include unintentional dictation errors.   Chinita Pester, FNP 12/22/19 1805    Sharyn Creamer, MD 12/24/19 2138

## 2019-12-22 NOTE — ED Notes (Signed)
Pt noted to have soiled bed sheets. Pt cleaned and repositioned in bed by this RN. Pt bed sheets and gown changed at this time.

## 2019-12-22 NOTE — ED Triage Notes (Signed)
Pt presents from home via acems with c/o change in LOC according to family. Pt baseline has dementia, but family reports pt has been more lethargic than normal. Pt only oriented to self at this time.CBG 205. Pt recently started on nemenda 1 week ago.

## 2019-12-22 NOTE — Progress Notes (Addendum)
Pt arrived to unit with soiled bed sheets. Pt changed. Adequate initial NIH stroke scale unable to be performed d/t pt lethargy. Per ED RN report, pt has been difficult to arouse. Pt to be transferred to MRI brain within the hour. Dr. Sherryll Burger notified that NIH unable to be performed at this time. WCTM.

## 2019-12-23 ENCOUNTER — Inpatient Hospital Stay (HOSPITAL_COMMUNITY)
Admit: 2019-12-23 | Discharge: 2019-12-23 | Disposition: A | Discharge status: Home / Self Care | Payer: Medicare HMO | Attending: Internal Medicine | Admitting: Internal Medicine

## 2019-12-23 ENCOUNTER — Observation Stay: Payer: Medicare HMO

## 2019-12-23 DIAGNOSIS — F17290 Nicotine dependence, other tobacco product, uncomplicated: Secondary | ICD-10-CM | POA: Diagnosis present

## 2019-12-23 DIAGNOSIS — R410 Disorientation, unspecified: Secondary | ICD-10-CM | POA: Diagnosis present

## 2019-12-23 DIAGNOSIS — R7301 Impaired fasting glucose: Secondary | ICD-10-CM | POA: Diagnosis present

## 2019-12-23 DIAGNOSIS — E876 Hypokalemia: Secondary | ICD-10-CM | POA: Diagnosis present

## 2019-12-23 DIAGNOSIS — I10 Essential (primary) hypertension: Secondary | ICD-10-CM | POA: Diagnosis present

## 2019-12-23 DIAGNOSIS — F0150 Vascular dementia without behavioral disturbance: Secondary | ICD-10-CM | POA: Diagnosis present

## 2019-12-23 DIAGNOSIS — Z6829 Body mass index (BMI) 29.0-29.9, adult: Secondary | ICD-10-CM | POA: Diagnosis not present

## 2019-12-23 DIAGNOSIS — Z86718 Personal history of other venous thrombosis and embolism: Secondary | ICD-10-CM | POA: Diagnosis not present

## 2019-12-23 DIAGNOSIS — R4182 Altered mental status, unspecified: Secondary | ICD-10-CM | POA: Diagnosis not present

## 2019-12-23 DIAGNOSIS — Z66 Do not resuscitate: Secondary | ICD-10-CM | POA: Diagnosis present

## 2019-12-23 DIAGNOSIS — I6523 Occlusion and stenosis of bilateral carotid arteries: Secondary | ICD-10-CM | POA: Diagnosis not present

## 2019-12-23 DIAGNOSIS — Z515 Encounter for palliative care: Secondary | ICD-10-CM | POA: Diagnosis not present

## 2019-12-23 DIAGNOSIS — Y92239 Unspecified place in hospital as the place of occurrence of the external cause: Secondary | ICD-10-CM | POA: Diagnosis not present

## 2019-12-23 DIAGNOSIS — R627 Adult failure to thrive: Secondary | ICD-10-CM | POA: Diagnosis present

## 2019-12-23 DIAGNOSIS — Z8673 Personal history of transient ischemic attack (TIA), and cerebral infarction without residual deficits: Secondary | ICD-10-CM | POA: Diagnosis not present

## 2019-12-23 DIAGNOSIS — S01511A Laceration without foreign body of lip, initial encounter: Secondary | ICD-10-CM | POA: Diagnosis not present

## 2019-12-23 DIAGNOSIS — Z20822 Contact with and (suspected) exposure to covid-19: Secondary | ICD-10-CM | POA: Diagnosis present

## 2019-12-23 DIAGNOSIS — I361 Nonrheumatic tricuspid (valve) insufficiency: Secondary | ICD-10-CM | POA: Diagnosis not present

## 2019-12-23 DIAGNOSIS — G9340 Encephalopathy, unspecified: Secondary | ICD-10-CM | POA: Diagnosis not present

## 2019-12-23 DIAGNOSIS — Z8249 Family history of ischemic heart disease and other diseases of the circulatory system: Secondary | ICD-10-CM | POA: Diagnosis not present

## 2019-12-23 DIAGNOSIS — Z7189 Other specified counseling: Secondary | ICD-10-CM | POA: Diagnosis not present

## 2019-12-23 DIAGNOSIS — G9341 Metabolic encephalopathy: Secondary | ICD-10-CM | POA: Diagnosis not present

## 2019-12-23 DIAGNOSIS — Z79899 Other long term (current) drug therapy: Secondary | ICD-10-CM | POA: Diagnosis not present

## 2019-12-23 DIAGNOSIS — Z7901 Long term (current) use of anticoagulants: Secondary | ICD-10-CM | POA: Diagnosis not present

## 2019-12-23 DIAGNOSIS — R569 Unspecified convulsions: Secondary | ICD-10-CM | POA: Diagnosis not present

## 2019-12-23 LAB — URINALYSIS, ROUTINE W REFLEX MICROSCOPIC
Bilirubin Urine: NEGATIVE
Glucose, UA: NEGATIVE mg/dL
Hgb urine dipstick: NEGATIVE
Ketones, ur: 20 mg/dL — AB
Leukocytes,Ua: NEGATIVE
Nitrite: NEGATIVE
Protein, ur: NEGATIVE mg/dL
Specific Gravity, Urine: 1.012 (ref 1.005–1.030)
pH: 7 (ref 5.0–8.0)

## 2019-12-23 LAB — TSH: TSH: 1.289 u[IU]/mL (ref 0.350–4.500)

## 2019-12-23 LAB — LIPID PANEL
Cholesterol: 181 mg/dL (ref 0–200)
HDL: 64 mg/dL (ref >40)
LDL Cholesterol: 103 mg/dL — ABNORMAL HIGH (ref 0–99)
Total CHOL/HDL Ratio: 2.8 RATIO
Triglycerides: 72 mg/dL (ref <150)
VLDL: 14 mg/dL (ref 0–40)

## 2019-12-23 LAB — BLOOD GAS, ARTERIAL
Acid-Base Excess: 1.7 mmol/L (ref 0.0–2.0)
Bicarbonate: 24.4 mmol/L (ref 20.0–28.0)
FIO2: 0.21
O2 Saturation: 96.7 %
Patient temperature: 37
pCO2 arterial: 32 mmHg (ref 32.0–48.0)
pH, Arterial: 7.49 — ABNORMAL HIGH (ref 7.350–7.450)
pO2, Arterial: 80 mmHg — ABNORMAL LOW (ref 83.0–108.0)

## 2019-12-23 LAB — AMMONIA: Ammonia: 23 umol/L (ref 9–35)

## 2019-12-23 LAB — VITAMIN B12: Vitamin B-12: 2129 pg/mL — ABNORMAL HIGH (ref 180–914)

## 2019-12-23 LAB — SARS CORONAVIRUS 2 (TAT 6-24 HRS): SARS Coronavirus 2: NEGATIVE

## 2019-12-23 MED ORDER — METOPROLOL TARTRATE 5 MG/5ML IV SOLN
5.0000 mg | Freq: Four times a day (QID) | INTRAVENOUS | Status: DC
  Administered 2019-12-23 – 2019-12-26 (×10): 5 mg via INTRAVENOUS
  Filled 2019-12-23 (×10): qty 5

## 2019-12-23 NOTE — Progress Notes (Signed)
Patient failed bedside swallow. NPO. Metoprolol changed to IV

## 2019-12-23 NOTE — Progress Notes (Signed)
PT Cancellation Note  Patient Details Name: [name] MRN: [id number] DOB: 07-02-1936   Cancelled Treatment:    Reason Eval/Treat Not Completed: Fatigue/lethargy limiting ability to participate.  Per RN, pt lethargic and not following commands at this time and is not currently appropriate to participate with PT services.  Will attempt to see pt at a future date/time as medically appropriate.     Ovidio Hanger PT, DPT 12/23/19, 10:34 AM

## 2019-12-23 NOTE — Progress Notes (Signed)
OT Cancellation Note  Patient Details Name: Natalie Knight MRN: [id number] DOB: [birth year]   Cancelled Treatment:    Reason Eval/Treat Not Completed: Fatigue/lethargy limiting ability to participate OT consult received and chart reviewed. Per RN, pt not command following this date and intermittently lethargic. Upon entering room, pt asleep and not easily woken. Communicated update to family at bedside re: OT services. Will follow acutely and initiate services as appropriate.   Kathie Dike, M.S. OTR/L  12/23/19, 10:19 AM

## 2019-12-23 NOTE — Progress Notes (Signed)
Patient was unable to tolerate MRI process and therefore was sent back the first time when sent to MRI. Natalie Schwartz, NP was notified and order for Ativan given. Took a while but was picked up for MRI and finally completed. Nothing acute. This Clinical research associate along with another RN attempted to perform stroke screen several times but patient remains confused and non-compliant therefore could no perform stroke screen. Attempted to perform swallow but patient could not follow command and therefore could not swallow much. Swallow eval failed, no oral  meds given. Has order for SLP eval today. Will pass this information to on-coming RN.

## 2019-12-23 NOTE — Progress Notes (Addendum)
PROGRESS NOTE  Natalie Knight UOH:[id number] DOB: [birth year] DOA: 12/22/2019 PCP: Barbette Reichmann, MD   LOS: 0 days   Brief Narrative / Interim history: 84 year old female with history of DM, HTN, vascular dementia, prior CVA, chronic DVT on Eliquis was being brought to the hospital by the patient's son and daughter-in-law due to worsening lethargy.  Patient has underlying dementia but she has been more lethargic than normal in the last several days.  Patient tells me that she has been on Namenda for several months, she was taking that twice a day however she was sleeping all the time.  They discussed with the PCP and that her Candiss Norse was initially given once a day and then every other day.  About a week ago the increased Namenda dose back again to daily and ever since then it has become more lethargic and sleepy.  Over the last day she has had difficulties taking her pills and even dropped her boost while attempting to drink it and became extremely weak.  She was brought to the hospital.  Subjective / 24h Interval events: Quite lethargic, sleeping in bed, tries to open eyes but cannot stay awake for more than few seconds at a time.  Assessment & Plan: Principal Problem Acute metabolic encephalopathy -She underwent an MRI of the brain which was negative for acute CVA.  Carotid Dopplers, echo pending but overall unclear etiology.  This can certainly be progression of her dementia or side effect of Namenda.  Hold Namenda for now, monitor mental status -She continues to be lethargic and not yet at baseline currently, family at bedside tells me that normally she is more awake and interactive and currently she is quite lethargic -Urinalysis without evidence of UTI however patient's daughter reports significant frequency, repeat a UA.  Chest x-ray without pneumonia, she is afebrile and white count is normal -Obtain TSH, ammonia level, also obtain an ABG this morning given lethargy although she does not  have significant risk factors for CO2 retention  Active Problems Type 2 diabetes mellitus -Continue sliding scale  Essential hypertension -Blood pressure acceptable, continue losartan, furosemide dementia  Dementia -Hold Namenda for now  Scheduled Meds: .  stroke: mapping our early stages of recovery book   Does not apply Once  . aspirin  300 mg Rectal Daily   Or  . aspirin  325 mg Oral Daily  . enoxaparin (LOVENOX) injection  40 mg Subcutaneous Q24H  . escitalopram  20 mg Oral Daily  . feeding supplement (ENSURE ENLIVE)  237 mL Oral TID BM  . furosemide  40 mg Oral Daily  . losartan  50 mg Oral Daily  . magnesium oxide  800 mg Oral BID  . metoprolol tartrate  5 mg Intravenous Q6H  . multivitamin with minerals  1 tablet Oral Daily  . omega-3 acid ethyl esters  1 g Oral BID  . traZODone  50 mg Oral QHS   Continuous Infusions: . sodium chloride 75 mL/hr at 12/23/19 1100   PRN Meds:.acetaminophen **OR** acetaminophen (TYLENOL) oral liquid 160 mg/5 mL **OR** acetaminophen, acetaminophen, senna-docusate  DVT prophylaxis: Lovenox Code Status: DNR Family Communication: d/w daughter in law and son at bedside  Patient admitted from: home Anticipated d/c place: TBD Barriers to d/c: Has persistent encephalopathy this morning, has not returned to baseline and unsafe to be discharged from the hospital currently.  PT evaluation pending  Consultants:  None  Procedures:  2D echo: pending  Microbiology  none  Antimicrobials: none    Objective:  Vitals:   12/22/19 1848 12/22/19 2205 12/23/19 0558 12/23/19 0740  BP:  (!) 119/93 (!) 152/93 (!) 133/95  Pulse:  72 75 73  Resp:  16 16   Temp:  97.9 F (36.6 C) 98.6 F (37 C) 98.7 F (37.1 C)  TempSrc:  Oral Oral Oral  SpO2: 98% 99% 99% 98%  Weight:      Height:        Intake/Output Summary (Last 24 hours) at 12/23/2019 1210 Last data filed at 12/23/2019 0800 Gross per 24 hour  Intake 0 ml  Output 200 ml  Net -200 ml    Filed Weights   12/22/19 1210  Weight: 79.6 kg    Examination:  Constitutional: Lethargic Eyes: no scleral icterus ENMT: Mucous membranes are moist.  Neck: normal, supple Respiratory: clear to auscultation bilaterally, no wheezing, no crackles.  Cardiovascular: Regular rate and rhythm. No LE edema. Good peripheral pulses Abdomen: non distended, no tenderness. Bowel sounds positive.  Musculoskeletal: no clubbing / cyanosis.  Skin: no rashes Neurologic: Does not follow commands consistently  Data Reviewed: I have independently reviewed following labs and imaging studies   CBC: Recent Labs  Lab 12/22/19 1212  WBC 9.2  NEUTROABS 7.7  HGB 12.6  HCT 39.6  MCV 78.0*  PLT 190   Basic Metabolic Panel: Recent Labs  Lab 12/22/19 1212  NA 136  K 4.2  CL 103  CO2 21*  GLUCOSE 188*  BUN 21  CREATININE 0.99  CALCIUM 9.4   Liver Function Tests: Recent Labs  Lab 12/22/19 1212  AST 29  ALT 27  ALKPHOS 42  BILITOT 0.8  PROT 6.3*  ALBUMIN 3.8   Coagulation Profile: Recent Labs  Lab 12/22/19 1419  INR 0.9   HbA1C: No results for input(s): HGBA1C in the last 72 hours. CBG: Recent Labs  Lab 12/22/19 1830  GLUCAP 105*    Recent Results (from the past 240 hour(s))  SARS CORONAVIRUS 2 (TAT 6-24 HRS) Nasopharyngeal Nasopharyngeal Swab     Status: None   Collection Time: 12/22/19  3:17 PM   Specimen: Nasopharyngeal Swab  Result Value Ref Range Status   SARS Coronavirus 2 NEGATIVE NEGATIVE Final    Comment: (NOTE) SARS-CoV-2 target nucleic acids are NOT DETECTED. The SARS-CoV-2 RNA is generally detectable in upper and lower respiratory specimens during the acute phase of infection. Negative results do not preclude SARS-CoV-2 infection, do not rule out co-infections with other pathogens, and should not be used as the sole basis for treatment or other patient management decisions. Negative results must be combined with clinical observations, patient history,  and epidemiological information. The expected result is Negative. Fact Sheet for Patients: HairSlick.no Fact Sheet for Healthcare Providers: quierodirigir.com This test is not yet approved or cleared by the [place] FDA and  has been authorized for detection and/or diagnosis of SARS-CoV-2 by FDA under an Emergency Use Authorization (EUA). This EUA will remain  in effect (meaning this test can be used) for the duration of the COVID-19 declaration under Section 56 4(b)(1) of the Act, 21 U.S.C. section 360bbb-3(b)(1), unless the authorization is terminated or revoked sooner. Performed at [facility], [address], [place], [place] [address]      Radiology Studies: DG Chest 2 View  Result Date: 12/22/2019 CLINICAL DATA:  84 year old female with altered mental status. EXAM: CHEST - 2 VIEW COMPARISON:  Chest radiograph dated 04/26/2018. FINDINGS: There is no focal consolidation, pleural effusion or pneumothorax. Stable cardiac silhouette. Atherosclerotic calcification of  the aorta. No acute osseous pathology. IMPRESSION: No active cardiopulmonary disease. Electronically Signed   By: Anner Crete M.D.   On: 12/22/2019 17:23   CT Head Wo Contrast  Result Date: 12/22/2019 CLINICAL DATA:  Altered mental status. Baseline dementia. EXAM: CT HEAD WITHOUT CONTRAST TECHNIQUE: Contiguous axial images were obtained from the base of the skull through the vertex without intravenous contrast. COMPARISON:  CT head 04/18/2018. MRI brain 04/28/2018. FINDINGS: Despite efforts by the technologist and patient, mild motion artifact is present on today's exam and could not be eliminated. This reduces exam sensitivity and specificity. Multiple images were repeated. Brain: There is no evidence of acute intracranial hemorrhage, mass lesion, brain edema or extra-axial fluid collection. There is generalized atrophy with prominence of the ventricles and  subarachnoid spaces. There is confluent low-density in the periventricular and subcortical white matter which is mildly progressive, and most consistent with chronic small vessel ischemic changes. There is no CT evidence of acute cortical infarction. Vascular: Intracranial vascular calcifications. No hyperdense vessel identified. Skull: Negative for fracture or focal lesion. Sinuses/Orbits: The visualized paranasal sinuses and mastoid air cells are clear. No orbital abnormalities are seen. Other: None. IMPRESSION: 1. No acute intracranial findings. 2. Atrophy with mildly progressive chronic small vessel ischemic changes in the periventricular and subcortical white matter. Electronically Signed   By: Richardean Sale M.D.   On: 12/22/2019 13:49   MR BRAIN WO CONTRAST  Result Date: 12/23/2019 CLINICAL DATA:  Initial evaluation for acute encephalopathy. EXAM: MRI HEAD WITHOUT CONTRAST TECHNIQUE: Multiplanar, multiecho pulse sequences of the brain and surrounding structures were obtained without intravenous contrast. COMPARISON:  Prior head CT from 12/22/2019 FINDINGS: Brain: Examination severely degraded by motion artifact. Diffuse prominence of the CSF containing spaces compatible generalized age-related cerebral atrophy. Extensive confluent T2/FLAIR hyperintensity within the periventricular white matter and pons, most consistent with chronic small vessel ischemic disease, moderate to advanced in nature. No abnormal foci of restricted diffusion to suggest acute or subacute ischemia. Gray-white matter differentiation maintained. No encephalomalacia to suggest chronic cortical infarction. No definite evidence for acute or chronic intracranial hemorrhage. No mass lesion, midline shift or mass effect. Mild ventricular prominence related to global parenchymal volume loss without hydrocephalus. No extra-axial fluid collection. Vascular: Major intracranial vascular flow voids grossly maintained at the skull base. Skull  and upper cervical spine: Craniocervical junction within normal limits. Bone marrow signal intensity grossly normal. No scalp soft tissue abnormality. Sinuses/Orbits: Globes and orbital soft tissues grossly within normal limits. Paranasal sinuses are largely clear. Small to moderate left mastoid effusion, of doubtful significance. Other: None. IMPRESSION: 1. Technically limited exam due to extensive motion artifact. 2. No definite acute intracranial abnormality. 3. Age-related cerebral atrophy with moderate to advanced chronic microvascular ischemic disease. Electronically Signed   By: Jeannine Boga M.D.   On: 12/23/2019 03:15   US Carotid Bilateral (at [facility] and [facility] only)  Result Date: 12/23/2019 CLINICAL DATA:  Altered mental status.  Hypertension, diabetes EXAM: BILATERAL CAROTID DUPLEX ULTRASOUND TECHNIQUE: Pearline Cables scale imaging, color Doppler and duplex ultrasound were performed of bilateral carotid and vertebral arteries in the neck. COMPARISON:  CT 04/18/2018 FINDINGS: Criteria: Quantification of carotid stenosis is based on velocity parameters that correlate the residual internal carotid diameter with NASCET-based stenosis levels, using the diameter of the distal internal carotid lumen as the denominator for stenosis measurement. The following velocity measurements were obtained: RIGHT ICA: 66/12 cm/sec CCA: 24/40 cm/sec SYSTOLIC ICA/CCA RATIO:  1.2 ECA: 86 cm/sec LEFT ICA: 66/15 cm/sec CCA:  55/15 cm/sec SYSTOLIC ICA/CCA RATIO:  1.2 ECA: 59 cm/sec RIGHT CAROTID ARTERY: Calcified plaque in the bulb and ICA origin with only mild stenosis. Normal waveforms and color Doppler signal. RIGHT VERTEBRAL ARTERY:  Normal flow direction and waveform. LEFT CAROTID ARTERY: Minimal plaque. No stenosis. Normal waveforms and color Doppler signal. LEFT VERTEBRAL ARTERY:  Normal flow direction and waveform. IMPRESSION: 1. Bilateral carotid bifurcation plaque resulting in less than 50% diameter ICA stenosis. 2. Antegrade  bilateral vertebral arterial flow. Electronically Signed   By: Corlis Leak M.D.   On: 12/23/2019 11:17   Pamella Pert, MD, PhD [facility]  Between 7 am - 7 pm I am available, please contact me via Amion or Securechat  Between 7 pm - 7 am I am not available, please contact night coverage MD/APP via Amion

## 2019-12-23 NOTE — Progress Notes (Signed)
Pt pulled purewick out of position and had wet through all her linen.  Pt was able to follow commands while this nurse and aide were changing her linen, gown and washing her up.  Did not respond to nurses questions but would open eyes and look around when spoken to.  Resting soundly in bed now.  Will monitor.

## 2019-12-24 LAB — CBC
HCT: 43.4 % (ref 36.0–46.0)
Hemoglobin: 14.2 g/dL (ref 12.0–15.0)
MCH: 24.6 pg — ABNORMAL LOW (ref 26.0–34.0)
MCHC: 32.7 g/dL (ref 30.0–36.0)
MCV: 75.1 fL — ABNORMAL LOW (ref 80.0–100.0)
Platelets: 178 10*3/uL (ref 150–400)
RBC: 5.78 MIL/uL — ABNORMAL HIGH (ref 3.87–5.11)
RDW: 13.4 % (ref 11.5–15.5)
WBC: 8.1 10*3/uL (ref 4.0–10.5)
nRBC: 0 % (ref 0.0–0.2)

## 2019-12-24 LAB — COMPREHENSIVE METABOLIC PANEL
ALT: 25 U/L (ref 0–44)
AST: 27 U/L (ref 15–41)
Albumin: 3.8 g/dL (ref 3.5–5.0)
Alkaline Phosphatase: 33 U/L — ABNORMAL LOW (ref 38–126)
Anion gap: 15 (ref 5–15)
BUN: 12 mg/dL (ref 8–23)
CO2: 25 mmol/L (ref 22–32)
Calcium: 9.1 mg/dL (ref 8.9–10.3)
Chloride: 98 mmol/L (ref 98–111)
Creatinine, Ser: 0.83 mg/dL (ref 0.44–1.00)
GFR calc Af Amer: >60 mL/min (ref >60)
GFR calc non Af Amer: >60 mL/min (ref >60)
Glucose, Bld: 125 mg/dL — ABNORMAL HIGH (ref 70–99)
Potassium: 3.1 mmol/L — ABNORMAL LOW (ref 3.5–5.1)
Sodium: 138 mmol/L (ref 135–145)
Total Bilirubin: 1.4 mg/dL — ABNORMAL HIGH (ref 0.3–1.2)
Total Protein: 7 g/dL (ref 6.5–8.1)

## 2019-12-24 LAB — PHOSPHORUS: Phosphorus: 2.5 mg/dL (ref 2.5–4.6)

## 2019-12-24 LAB — MAGNESIUM: Magnesium: 0.9 mg/dL — CL (ref 1.7–2.4)

## 2019-12-24 LAB — ECHOCARDIOGRAM COMPLETE
Height: 65 in
Weight: 2807.78 oz

## 2019-12-24 MED ORDER — CLONIDINE HCL 0.1 MG/24HR TD PTWK
0.1000 mg | MEDICATED_PATCH | TRANSDERMAL | Status: DC
  Administered 2019-12-24: 0.1 mg via TRANSDERMAL
  Filled 2019-12-24: qty 1

## 2019-12-24 MED ORDER — MAGNESIUM SULFATE 4 GM/100ML IV SOLN
4.0000 g | Freq: Once | INTRAVENOUS | Status: AC
  Administered 2019-12-24: 4 g via INTRAVENOUS
  Filled 2019-12-24 (×2): qty 100

## 2019-12-24 MED ORDER — LABETALOL HCL 5 MG/ML IV SOLN: 10.0000 mg | Freq: Four times a day (QID) | INTRAVENOUS | Status: DC | PRN

## 2019-12-24 MED ORDER — POTASSIUM CHLORIDE 10 MEQ/100ML IV SOLN
10.0000 meq | INTRAVENOUS | Status: AC
  Administered 2019-12-24 (×2): 10 meq via INTRAVENOUS
  Filled 2019-12-24 (×2): qty 100

## 2019-12-24 NOTE — Evaluation (Signed)
Clinical/Bedside Swallow Evaluation Patient Details  Name: Natalie Knight MRN: [id number] Date of Birth: 02-16-1936  Today's Date: 12/24/2019 Time: SLP Start Time (ACUTE ONLY): 0915 SLP Stop Time (ACUTE ONLY): 1002 SLP Time Calculation (min) (ACUTE ONLY): 47 min  Past Medical History:  Past Medical History:  Diagnosis Date  . Diabetes mellitus without complication (HCC)   . Hypertension    Past Surgical History: History reviewed. No pertinent surgical history. HPI:  Pt is a 84 y.o. female with a known history of diabetes mellitus, hypertension, vascular Dementia, previous CVA, left DVT on Eliquis is being admitted for acute metabolic encephalopathy. Pt was brought in from home via acems with c/o change in LOC according to daughter. Pt baseline has vascular dementia, but family reports pt has been more lethargic than normal. Patient has underlying Vascular Dementia but she has been more lethargic than normal in the last several days. Family reported she has been on Namenda for several months, and has has dosage changes recently d/t lethargy. Currently she is unable to maintain an awake/alert State for any length of time.   Assessment / Plan / Recommendation Clinical Impression  This was a limited assessment d/t pt's poor alertness and inability to maintain an alert/awake State. Pt has a Baseline of Vascular Dementia. Pt was lethargic and only responded Briefly to Mod+ tactile/verbal stim of sternal rub and swabs at lips/gums. Pt responded w/ brief phonations and grimace; she clenced teeth tightly to not allow swabs to pass teeth anteriorly. Limited oral assessment and care completed d/t pt's declined Cognitive and alert status'; pt made no attempt to awaken or engage despite Mod+ tactile/verbal stim this session. Both NSG and therapies have witnessed the same [place] since admit. Recommend continue NPO status d/t risk for aspiration w/ any oral intake; frequent oral care for hygiene and stimulation  of swallowing w/ aspiration precautions. ST services will f/u daily w/ pt's status and appropriateness for assessment/BSE. Recommend f/u w/ Palliative Care for GOC consult; Dietician. Recommend a Neurology consult for insight into pt's Cognitive status, baseline, and prognosis.  SLP Visit Diagnosis: Dysphagia, oropharyngeal phase (R13.12)(Cognitive decline)    Aspiration Risk  Severe aspiration risk;Risk for inadequate nutrition/hydration    Diet Recommendation  NPO w/ frequent oral care for hygiene and stimulation of swallowing w/ aspiration precautions  Medication Administration: Via alternative means    Other  Recommendations Recommended Consults: (Neurology; Palliative Care; Dietician) Oral Care Recommendations: Oral care QID;Staff/trained caregiver to provide oral care Other Recommendations: (TBD)   Follow up Recommendations Skilled Nursing facility(TBD)      Frequency and Duration min 3x week  2 weeks       Prognosis Prognosis for Safe Diet Advancement: Guarded Barriers to Reach Goals: Cognitive deficits;Time post onset;Severity of deficits;Behavior      Swallow Study   General Date of Onset: 12/22/19 HPI: Pt is a 84 y.o. female with a known history of diabetes mellitus, hypertension, vascular Dementia, previous CVA, left DVT on Eliquis is being admitted for acute metabolic encephalopathy. Pt was brought in from home via acems with c/o change in LOC according to daughter. Pt baseline has vascular dementia, but family reports pt has been more lethargic than normal. Patient has underlying Vascular Dementia but she has been more lethargic than normal in the last several days. Family reported she has been on Namenda for several months, and has has dosage changes recently d/t lethargy. Currently she is unable to maintain an awake/alert State for any length of time. Type of Study:  Bedside Swallow Evaluation Previous Swallow Assessment: none reported Diet Prior to this Study:  NPO(regular diet at home per report) Temperature Spikes Noted: No(wbc 8.1) Respiratory Status: Room air History of Recent Intubation: No Behavior/Cognition: Lethargic/Drowsy Oral Cavity Assessment: (CNT) Oral Care Completed by SLP: Yes(attempted) Oral Cavity - Dentition: Adequate natural dentition(some assessed) Vision: (n/a) Self-Feeding Abilities: Total assist(would need assist but no po's taken this session) Patient Positioning: Upright in bed(elevated more) Baseline Vocal Quality: Not observed Volitional Cough: Cognitively unable to elicit Volitional Swallow: Unable to elicit    Oral/Motor/Sensory Function Overall Oral Motor/Sensory Function: (CNT )   Ice Chips Ice chips: Not tested   Thin Liquid Thin Liquid: Not tested    Nectar Thick Nectar Thick Liquid: Not tested   Honey Thick Honey Thick Liquid: Not tested   Puree Puree: Not tested   Solid     Solid: Not tested       Orinda Kenner, MS, CCC-SLP Yolandra Habig 12/24/2019,1:14 PM

## 2019-12-24 NOTE — TOC Progression Note (Signed)
Transition of Care [facility]) - Progression Note    Patient Details  Name: Natalie Knight MRN: [id number] Date of Birth: 1936/07/28  [facility] CM/SW Contact  Barrie Dunker, RN Phone Number: 12/24/2019, 12:50 PM  Clinical Narrative:    Glennon Mac [phone] A Bed search will need to be done once the patient is no longer lethargic and assessment can be completed, Insurance auth can not be obtained until patient is able to work with therapy        Expected Discharge Plan and Services                                                 Social Determinants of Health (SDOH) Interventions    Readmission Risk Interventions No flowsheet data found.

## 2019-12-24 NOTE — Progress Notes (Signed)
D: Pt not alert or oriented, pt is observed as being very drowsy and sleeping throughout day.  Pt does not express experiencing any pain at this time.   A: Scheduled medications administered to pt, per MD orders. Support and encouragement provided. Frequent verbal contact made.   R: No adverse drug reactions noted. Pt complaint with medications and treatment plan. Pt interacts well with staff on the unit. Will continue to monitor, and provide care for as ordered.

## 2019-12-24 NOTE — Progress Notes (Signed)
PROGRESS NOTE    Natalie Knight  QGB:[id number] DOB: 15-May-1936 DOA: 12/22/2019 PCP: Tracie Harrier, MD (Confirm with patient/family/NH records and if not entered, this HAS to be entered at [facility] point of entry. "No PCP" if truly none.)   Brief Narrative: (Start on day 1 of progress note - keep it brief and live) Patient is 84 year old female with history of type diabetes, hypertension, vascular dementia and a prior history of a CVA chronic DVT on Eliquis who was admitted to the hospital on 4/10 for altered mental status.  She had a recent change of medicine with Namenda.  However, after speaking with patient daughter-in-law, patient had a 3 episodes of similar.  Of altered mental status.  The first time she was diagnosed with a stroke.  The second time, the situation lasted about few days, her condition resolved.  There was no clear diagnosis.  For the last few month, patient has been sleeping more than 16 to 18 hours a day.  However, patient has been very lethargic and sleepy all the time for the last few days.  Lenox Ponds has been discontinued since admission to the hospital.  He is still very sleepy.  Has not been able to eat. Reviewed echocardiogram, ejection fraction 60 to 80%.   Assessment & Plan:   #1.  Acute metabolic encephalopathy. Imaging study did not show any evidence of stroke.  No evidence of bacterial infection.  Patient has received 2 days of IV fluids, did not improve patient condition.  Namenda was discontinued. Patient condition seem to be deteriorating gradually.  Condition might be terminal due to prolonged sleepiness.  I will obtain hospice consult.  Patient probably can go home tomorrow with hospice.  Patient is still unable to take any p.o., currently on fluids, I will reduce to 40 mL/h.  #2.  Vascular dementia with history of a stroke.  Hold Namenda.  3.  Essential hypertension.  Blood pressure running high due to unable to take any p.o. medicines.  Will start clonidine  patch.  4.  Hypomagnesemia and hypokalemia.  Supplement with IV.    DVT prophylaxis: Lovenox Code Status: DNR Family Communication:  discussed with patient son and daughter-in-law. Disposition Plan:  . Patient came from:Home            . Anticipated d/c place: Home with hospice . Barriers to d/c OR conditions which need to be met to effect a safe d/c:   Consultants:   Hospice   Procedures: None   Antimicrobials: None   Subjective: Seen patient with daughter-in-law in room.  Patient has been sleepy all day today.  She has not been able to eat. She does not have any headaches.  She does not have any nausea vomiting.  No short of breath or cough.  Objective: Vitals:   12/24/19 0109 12/24/19 0403 12/24/19 0854 12/24/19 1207  BP:  (!) 170/137 137/76 124/67  Pulse: 72 (!) 59 61 61  Resp:  _0 Temp:  98.2 F (36.8 C) 98.8 F (37.1 C) 98.6 F (37 C)  TempSrc:  Oral Oral Axillary  SpO2:   100% 100%  Weight:      Height:        Intake/Output Summary (Last 24 hours) at 12/24/2019 1351 Last data filed at 12/24/2019 1014 Gross per 24 hour  Intake --  Output 1275 ml  Net -1275 ml   Filed Weights   12/22/19 1210  Weight: 79.6 kg    Examination:  General exam: Appears  calm and comfortable  Respiratory system: Clear to auscultation. Respiratory effort normal. Cardiovascular system: S1 & S2 heard, RRR. No JVD, murmurs, rubs, gallops or clicks. No pedal edema. Gastrointestinal system: Abdomen is nondistended, soft and nontender. No organomegaly or masses felt. Normal bowel sounds heard. Central nervous system: Alert and oriented. No focal neurological deficits. Extremities: Symmetric 5 x 5 power. Skin: No rashes, lesions or ulcers Psychiatry: Sleepy and confused, oriented to herself.    Data Reviewed: I have personally reviewed following labs and imaging studies  CBC: Recent Labs  Lab 12/22/19 1212 12/24/19 0639  WBC 9.2 8.1  NEUTROABS 7.7  --   HGB  12.6 14.2  HCT 39.6 43.4  MCV 78.0* 75.1*  PLT 190 850   Basic Metabolic Panel: Recent Labs  Lab 12/22/19 1212 12/24/19 0639  NA 136 138  K 4.2 3.1*  CL 103 98  CO2 21* 25  GLUCOSE 188* 125*  BUN 21 12  CREATININE 0.99 0.83  CALCIUM 9.4 9.1  MG  --  0.9*  PHOS  --  2.5   GFR: Estimated Creatinine Clearance: 55.4 mL/min (by C-G formula based on SCr of 0.83 mg/dL). Liver Function Tests: Recent Labs  Lab 12/22/19 1212 12/24/19 0639  AST 29 27  ALT 27 25  ALKPHOS 42 33*  BILITOT 0.8 1.4*  PROT 6.3* 7.0  ALBUMIN 3.8 3.8   No results for input(s): LIPASE, AMYLASE in the last 168 hours. Recent Labs  Lab 12/23/19 1244  AMMONIA 23   Coagulation Profile: Recent Labs  Lab 12/22/19 1419  INR 0.9   Cardiac Enzymes: No results for input(s): CKTOTAL, CKMB, CKMBINDEX, TROPONINI in the last 168 hours. BNP (last 3 results) No results for input(s): PROBNP in the last 8760 hours. HbA1C: No results for input(s): HGBA1C in the last 72 hours. CBG: Recent Labs  Lab 12/22/19 1830  GLUCAP 105*   Lipid Profile: Recent Labs    12/23/19 0651  CHOL 181  HDL 64  LDLCALC 103*  TRIG 72  CHOLHDL 2.8   Thyroid Function Tests: Recent Labs    12/23/19 1244  TSH 1.289   Anemia Panel: Recent Labs    12/23/19 1244  VITAMINB12 2,129*   Sepsis Labs: No results for input(s): PROCALCITON, LATICACIDVEN in the last 168 hours.  Recent Results (from the past 240 hour(s))  SARS CORONAVIRUS 2 (TAT 6-24 HRS) Nasopharyngeal Nasopharyngeal Swab     Status: None   Collection Time: 12/22/19  3:17 PM   Specimen: Nasopharyngeal Swab  Result Value Ref Range Status   SARS Coronavirus 2 NEGATIVE NEGATIVE Final    Comment: (NOTE) SARS-CoV-2 target nucleic acids are NOT DETECTED. The SARS-CoV-2 RNA is generally detectable in upper and lower respiratory specimens during the acute phase of infection. Negative results do not preclude SARS-CoV-2 infection, do not rule out co-infections  with other pathogens, and should not be used as the sole basis for treatment or other patient management decisions. Negative results must be combined with clinical observations, patient history, and epidemiological information. The expected result is Negative. Fact Sheet for Patients: SugarRoll.be Fact Sheet for Healthcare Providers: https://www.woods-mathews.com/ This test is not yet approved or cleared by the [place] FDA and  has been authorized for detection and/or diagnosis of SARS-CoV-2 by FDA under an Emergency Use Authorization (EUA). This EUA will remain  in effect (meaning this test can be used) for the duration of the COVID-19 declaration under Section 56 4(b)(1) of the Act, 21 U.S.C. section 360bbb-3(b)(1), unless the authorization  is terminated or revoked sooner. Performed at [facility], [place] [address], [place], [place] [address]          Radiology Studies: DG Chest 2 View  Result Date: 12/22/2019 CLINICAL DATA:  84 year old female with altered mental status. EXAM: CHEST - 2 VIEW COMPARISON:  Chest radiograph dated 04/26/2018. FINDINGS: There is no focal consolidation, pleural effusion or pneumothorax. Stable cardiac silhouette. Atherosclerotic calcification of the aorta. No acute osseous pathology. IMPRESSION: No active cardiopulmonary disease. Electronically Signed   By: Anner Crete M.D.   On: 12/22/2019 17:23   MR BRAIN WO CONTRAST  Result Date: 12/23/2019 CLINICAL DATA:  Initial evaluation for acute encephalopathy. EXAM: MRI HEAD WITHOUT CONTRAST TECHNIQUE: Multiplanar, multiecho pulse sequences of the brain and surrounding structures were obtained without intravenous contrast. COMPARISON:  Prior head CT from 12/22/2019 FINDINGS: Brain: Examination severely degraded by motion artifact. Diffuse prominence of the CSF containing spaces compatible generalized age-related cerebral atrophy. Extensive confluent  T2/FLAIR hyperintensity within the periventricular white matter and pons, most consistent with chronic small vessel ischemic disease, moderate to advanced in nature. No abnormal foci of restricted diffusion to suggest acute or subacute ischemia. Gray-white matter differentiation maintained. No encephalomalacia to suggest chronic cortical infarction. No definite evidence for acute or chronic intracranial hemorrhage. No mass lesion, midline shift or mass effect. Mild ventricular prominence related to global parenchymal volume loss without hydrocephalus. No extra-axial fluid collection. Vascular: Major intracranial vascular flow voids grossly maintained at the skull base. Skull and upper cervical spine: Craniocervical junction within normal limits. Bone marrow signal intensity grossly normal. No scalp soft tissue abnormality. Sinuses/Orbits: Globes and orbital soft tissues grossly within normal limits. Paranasal sinuses are largely clear. Small to moderate left mastoid effusion, of doubtful significance. Other: None. IMPRESSION: 1. Technically limited exam due to extensive motion artifact. 2. No definite acute intracranial abnormality. 3. Age-related cerebral atrophy with moderate to advanced chronic microvascular ischemic disease. Electronically Signed   By: Jeannine Boga M.D.   On: 12/23/2019 03:15   US Carotid Bilateral (at [facility] and [facility] only)  Result Date: 12/23/2019 CLINICAL DATA:  Altered mental status.  Hypertension, diabetes EXAM: BILATERAL CAROTID DUPLEX ULTRASOUND TECHNIQUE: Pearline Cables scale imaging, color Doppler and duplex ultrasound were performed of bilateral carotid and vertebral arteries in the neck. COMPARISON:  CT 04/18/2018 FINDINGS: Criteria: Quantification of carotid stenosis is based on velocity parameters that correlate the residual internal carotid diameter with NASCET-based stenosis levels, using the diameter of the distal internal carotid lumen as the denominator for stenosis measurement.  The following velocity measurements were obtained: RIGHT ICA: 66/12 cm/sec CCA: 81/44 cm/sec SYSTOLIC ICA/CCA RATIO:  1.2 ECA: 86 cm/sec LEFT ICA: 66/15 cm/sec CCA: 81/85 cm/sec SYSTOLIC ICA/CCA RATIO:  1.2 ECA: 59 cm/sec RIGHT CAROTID ARTERY: Calcified plaque in the bulb and ICA origin with only mild stenosis. Normal waveforms and color Doppler signal. RIGHT VERTEBRAL ARTERY:  Normal flow direction and waveform. LEFT CAROTID ARTERY: Minimal plaque. No stenosis. Normal waveforms and color Doppler signal. LEFT VERTEBRAL ARTERY:  Normal flow direction and waveform. IMPRESSION: 1. Bilateral carotid bifurcation plaque resulting in less than 50% diameter ICA stenosis. 2. Antegrade bilateral vertebral arterial flow. Electronically Signed   By: Lucrezia Europe M.D.   On: 12/23/2019 11:17   ECHOCARDIOGRAM COMPLETE  Result Date: 12/24/2019    ECHOCARDIOGRAM REPORT   Patient Name:   Natalie Knight Date of Exam: 12/23/2019 Medical Rec #:  [id number]     Height:       65.0  in Accession #:    [id number]    Weight:       175.5 lb Date of Birth:  Apr 03, 1936    BSA:          1.871 m Patient Age:    40 years      BP:           179/82 mmHg Patient Gender: F             HR:           66 bpm. Exam Location:  [facility] Procedure: 2D Echo, Cardiac Doppler and Color Doppler Indications:     R06.00 Dyspnea  History:         Patient has prior history of Echocardiogram examinations, most                  recent 04/19/2018. Risk Factors:Hypertension and Diabetes.  Sonographer:     Wilford Sports Rodgers-Jones Referring Phys:  [place] Diagnosing Phys: Kathlyn Sacramento MD IMPRESSIONS  1. Left ventricular ejection fraction, by estimation, is 60 to 65%. The left ventricle has normal function. The left ventricle has no regional wall motion abnormalities. There is mild left ventricular hypertrophy. Left ventricular diastolic parameters were normal.  2. Right ventricular systolic function is normal. The right ventricular size is normal. There is normal  pulmonary artery systolic pressure.  3. The mitral valve is abnormal. No evidence of mitral valve regurgitation. No evidence of mitral stenosis.  4. Tricuspid valve regurgitation is mild to moderate.  5. The aortic valve is normal in structure. Aortic valve regurgitation is not visualized. Mild to moderate aortic valve sclerosis/calcification is present, without any evidence of aortic stenosis. FINDINGS  Left Ventricle: Left ventricular ejection fraction, by estimation, is 60 to 65%. The left ventricle has normal function. The left ventricle has no regional wall motion abnormalities. The left ventricular internal cavity size was normal in size. There is  mild left ventricular hypertrophy. Left ventricular diastolic parameters were normal. Right Ventricle: The right ventricular size is normal. No increase in right ventricular wall thickness. Right ventricular systolic function is normal. There is normal pulmonary artery systolic pressure. The tricuspid regurgitant velocity is 2.38 m/s, and  with an assumed right atrial pressure of 5 mmHg, the estimated right ventricular systolic pressure is 01.6 mmHg. Left Atrium: Left atrial size was normal in size. Right Atrium: Right atrial size was normal in size. Pericardium: There is no evidence of pericardial effusion. Mitral Valve: The mitral valve is abnormal. Normal mobility of the mitral valve leaflets. Moderate mitral annular calcification. No evidence of mitral valve regurgitation. No evidence of mitral valve stenosis. Tricuspid Valve: The tricuspid valve is normal in structure. Tricuspid valve regurgitation is mild to moderate. No evidence of tricuspid stenosis. Aortic Valve: The aortic valve is normal in structure. Aortic valve regurgitation is not visualized. Mild to moderate aortic valve sclerosis/calcification is present, without any evidence of aortic stenosis. Pulmonic Valve: The pulmonic valve was normal in structure. Pulmonic valve regurgitation is mild. No  evidence of pulmonic stenosis. Aorta: The aortic root is normal in size and structure. Venous: The inferior vena cava was not well visualized. IAS/Shunts: No atrial level shunt detected by color flow Doppler.  LEFT VENTRICLE PLAX 2D LVIDd:         3.62 cm  Diastology LVIDs:         2.35 cm  LV e' lateral:   6.53 cm/s LV PW:         0.96 cm  LV E/e' lateral: 11.9 LV IVS:        0.97 cm  LV e' medial:    6.09 cm/s LVOT diam:     1.80 cm  LV E/e' medial:  12.8 LV SV:         39 LV SV Index:   21 LVOT Area:     2.54 cm  RIGHT VENTRICLE RV Basal diam:  3.21 cm RV S prime:     11.40 cm/s TAPSE (M-mode): 1.6 cm LEFT ATRIUM             Index LA diam:        3.20 cm 1.71 cm/m LA Vol (A2C):   44.6 ml 23.83 ml/m LA Vol (A4C):   34.9 ml 18.65 ml/m LA Biplane Vol: 40.0 ml 21.38 ml/m  AORTIC VALVE LVOT Vmax:   80.80 cm/s LVOT Vmean:  55.200 cm/s LVOT VTI:    0.155 m  AORTA Ao Root diam: 3.20 cm Ao Asc diam:  3.10 cm MITRAL VALVE               TRICUSPID VALVE MV Area (PHT): 2.60 cm    TR Peak grad:   22.7 mmHg MV Decel Time: 292 msec    TR Vmax:        238.00 cm/s MV E velocity: 77.70 cm/s MV A velocity: 91.30 cm/s  SHUNTS MV E/A ratio:  0.85        Systemic VTI:  0.16 m                            Systemic Diam: 1.80 cm Kathlyn Sacramento MD Electronically signed by Kathlyn Sacramento MD Signature Date/Time: 12/24/2019/12:46:33 PM    Final         Scheduled Meds: .  stroke: mapping our early stages of recovery book   Does not apply Once  . aspirin  300 mg Rectal Daily   Or  . aspirin  325 mg Oral Daily  . enoxaparin (LOVENOX) injection  40 mg Subcutaneous Q24H  . escitalopram  20 mg Oral Daily  . furosemide  40 mg Oral Daily  . losartan  50 mg Oral Daily  . magnesium oxide  800 mg Oral BID  . metoprolol tartrate  5 mg Intravenous Q6H  . omega-3 acid ethyl esters  1 g Oral BID  . traZODone  50 mg Oral QHS   Continuous Infusions: . sodium chloride 75 mL/hr at 12/24/19 0110  . magnesium sulfate bolus IVPB    .  potassium chloride       LOS: 1 day    Time spent: 45 minutes    Sharen Hones, MD [facility]   To contact the attending provider between 7A-7P or the covering provider during after hours 7P-7A, please log into the web site [email or web address] and access using universal [place] password for that web site. If you do not have the password, please call the hospital operator.  12/24/2019, 1:51 PM

## 2019-12-24 NOTE — Progress Notes (Signed)
SLP Cancellation Note  Patient Details Name: Natalie Knight MRN: [id number] DOB: [birth year]   Cancelled treatment:       Reason Eval/Treat Not Completed: Patient not medically ready(chart reviewed; Neuro f/u is indicated d/t Cognitive status). Patient has underlying Vascular Dementia but she has been more lethargic than normal in the last several days. Family reported she has been on Namenda for several months, and has has dosage changes recently d/t lethargy. Currently she is unable to maintain an awake/alert State for any purposeful engagement and/or Cognitive-linguistic assessment.  Before skilled ST services, recommend pt f/u w/ Neurology for assessment of pt's Cognitive status and prognosis of disease as well as seek to establish an appropriate medication regimen w/ Namenda for pt to be most functional to engage in evaluation and POC. Discussed w/ Son present to reduce environmental stimulation in the room and support w/ positioning, but also talk w/ pt to let her know family is around her.  ST services will be available for any further education while admitted.    Jerilynn Som, MS, CCC-SLP Milbert Bixler 12/24/2019, 9:56 AM

## 2019-12-24 NOTE — Progress Notes (Signed)
OT Cancellation Note  Patient Details Name: Natalie Knight MRN: [id number] DOB: Apr 19, 1936   Cancelled Treatment:    Reason Eval/Treat Not Completed: Fatigue/lethargy limiting ability to participate. Per RN and PT, pt lethargic and not following commands at this time and is not currently appropriate to participate with OT services.  Will attempt to see pt at a future date/time as medically appropriate.   Kathie Dike, M.S. OTR/L  12/24/19, 11:22 AM

## 2019-12-24 NOTE — Evaluation (Signed)
Physical Therapy Evaluation Patient Details Name: Natalie Knight MRN: [id number] DOB: Jul 25, 1936 Today's Date: 12/24/2019   History of Present Illness  Per MD notes: Pt is an 84 y.o. female with a known history of diabetes mellitus, hypertension, vascular dementia, previous CVA, left DVT on Eliquis is being admitted for acute metabolic encephalopathy.  Pt was brought in from home  with c/o change in LOC according to daughter.  MD assessment includes acute metabolic encephalopathy of unclear etiology and HTN.    Clinical Impression  Pt lethargic during the session limiting her ability to participate.  Pt assisted to sitting at the EOB and did become more alert with eyes remaining open and verbalizing minimally but overall remained lethargic and was unable to maintain static sitting balance independently.  Per son pt is able to ambulate with a rollator and manage all ADLs independently at baseline but would not be appropriate to return to her prior living situation at this time.  Pt will benefit from PT services in a SNF setting upon discharge to safely address deficits listed in patient problem list for decreased caregiver assistance and eventual return to PLOF.     Follow Up Recommendations SNF    Equipment Recommendations  None recommended by PT    Recommendations for Other Services       Precautions / Restrictions Precautions Precautions: Fall Restrictions Weight Bearing Restrictions: No      Mobility  Bed Mobility Overal bed mobility: Needs Assistance Bed Mobility: Supine to Sit;Sit to Supine     Supine to sit: Total assist;+2 for physical assistance Sit to supine: Total assist;+2 for physical assistance   General bed mobility comments: Total assist for BLE and trunk control with constant min to mod A to prevent LOB while sitting at the EOB  Transfers                 General transfer comment: Unable/unsafe to attempt  Ambulation/Gait                 Stairs            Wheelchair Mobility    Modified Rankin (Stroke Patients Only)       Balance Overall balance assessment: Needs assistance   Sitting balance-Leahy Scale: Poor Sitting balance - Comments: Constant min to mod physical assist to prevent LOB in sitting                                     Pertinent Vitals/Pain Pain Assessment: Faces Pain Score: 0-No pain    Home Living Family/patient expects to be discharged to:: Private residence Living Arrangements: Alone Available Help at Discharge: Family;Available PRN/intermittently Type of Home: Apartment Home Access: Stairs to enter Entrance Stairs-Rails: Right Entrance Stairs-Number of Steps: 1 Home Layout: One level Home Equipment: Clinical cytogeneticist - 2 wheels;Walker - 4 wheels;Bedside commode Additional Comments: History and PLOF obtained from son at bedside secondary to pt unable    Prior Function Level of Independence: Independent with assistive device(s)         Comments: Mod Ind amb with a rollator, Ind with bathing/dressing, two falls in the last 6 months with cause unkown     Hand Dominance        Extremity/Trunk Assessment   Upper Extremity Assessment Upper Extremity Assessment: Generalized weakness    Lower Extremity Assessment Lower Extremity Assessment: Generalized weakness       Communication  Cognition Arousal/Alertness: Lethargic Behavior During Therapy: Flat affect Overall Cognitive Status: Impaired/Different from baseline Area of Impairment: Attention;Following commands                                      General Comments      Exercises     Assessment/Plan    PT Assessment Patient needs continued PT services  PT Problem List Decreased strength;Decreased activity tolerance;Decreased balance;Decreased mobility;Decreased knowledge of use of DME       PT Treatment Interventions DME instruction;Gait training;Stair  training;Functional mobility training;Therapeutic activities;Therapeutic exercise;Balance training;Patient/family education    PT Goals (Current goals can be found in the Care Plan section)  Acute Rehab PT Goals PT Goal Formulation: Patient unable to participate in goal setting Time For Goal Achievement: 01/06/20 Potential to Achieve Goals: Fair    Frequency Min 2X/week   Barriers to discharge Inaccessible home environment;Decreased caregiver support      Co-evaluation               AM-PAC PT "6 Clicks" Mobility  Outcome Measure Help needed turning from your back to your side while in a flat bed without using bedrails?: Total Help needed moving from lying on your back to sitting on the side of a flat bed without using bedrails?: Total Help needed moving to and from a bed to a chair (including a wheelchair)?: Total Help needed standing up from a chair using your arms (e.g., wheelchair or bedside chair)?: Total Help needed to walk in hospital room?: Total Help needed climbing 3-5 steps with a railing? : Total 6 Click Score: 6    End of Session   Activity Tolerance: Patient limited by lethargy Patient left: in bed;with call bell/phone within reach;with bed alarm set;with family/visitor present Nurse Communication: Mobility status PT Visit Diagnosis: Difficulty in walking, not elsewhere classified (R26.2);Muscle weakness (generalized) (M62.81)    Time: 0940-1006 PT Time Calculation (min) (ACUTE ONLY): 26 min   Charges:   PT Evaluation $PT Eval Moderate Complexity: 1 Mod         D. Scott Caroline Longie PT, DPT 12/24/19, 11:22 AM

## 2019-12-24 NOTE — Progress Notes (Signed)
Initial Nutrition Assessment  RD working remotely.  DOCUMENTATION CODES:   Not applicable  INTERVENTION:   -D/c MVI, due to NPO status -D/c Ensure Enlive, due to NPO status -RD will follow for diet advancement and adjust supplement regimen as appropriate -If pt continues to be unable to safely take PO's due to mental status, consider short term enteral nutrition support via small bore feeding tube (NGT). Recommend:  Initiate Jevity 1.2 @ 20 ml/hr and increase by 10 ml every 8 hours to goal rate of 60 ml/hr.   Tube feeding regimen provides 1728 kcal (100% of needs), 80 grams of protein, and 1162 ml of H2O.   NUTRITION DIAGNOSIS:   Inadequate oral intake related to lethargy/confusion, inability to eat as evidenced by NPO status.  GOAL:   Patient will meet greater than or equal to 90% of their needs  MONITOR:   Diet advancement, Labs, Weight trends, Skin, I & O's  REASON FOR ASSESSMENT:   Malnutrition Screening Tool    ASSESSMENT:   84 year old female known history of diabetes mellitus, hypertension, vascular dementia, previous CVA, left DVT on Eliquis is being admitted for acute metabolic encephalopathy.  Pt admitted with acute metabolic encephalopathy.   4/11- failed nursing bedside swallow 4/12- per SLP note, pt not ready for BSE at this time  Reviewed I/O's: -800 ml x 24 hours and -1 L since admission  UOP: 800 ml x 24 hours   Per chart review, pt very lethargic and often not following commands.   Attempted to speak with pt/family members via phone, however, no answer. RD unable to obtain further nutrition history at this time. Per H&P, per daughter, PTA pt was independent and lived alone.  Reviewed wt hx; does not appear to have lost weight. Per CareEverywhere, last recorded wt was 141# on 11/02/19.   Medications reviewed and include 0.9% sodium chloride infusion @ 75 ml/hr.   Medications reviewed and include lasix and MVI.   Lab Results  Component Value  Date   HGBA1C 7.3 (H) 04/18/2018   PTA DM medications are none. Per ADA's Standards of Medical Care for Diabetes, glycemic targets for elderly patients who are otherwise healthy (few medical impairments) and cognitively intact should be less stringent (Hgb A1c <7.5).   Labs reviewed: K: 3.1, Mg: 0.9, no CBGS recorded (inpatient orders for glycemic control are none).   Diet Order:   Diet Order            Diet NPO time specified  Diet effective now              EDUCATION NEEDS:   No education needs have been identified at this time  Skin:  Skin Assessment: Reviewed RN Assessment  Last BM:  Unknown  Height:   Ht Readings from Last 1 Encounters:  12/22/19 5\' 5"  (1.651 m)    Weight:   Wt Readings from Last 1 Encounters:  12/22/19 79.6 kg    Ideal Body Weight:  56.8 kg  BMI:  Body mass index is 29.2 kg/m.  Estimated Nutritional Needs:   Kcal:  1550-1750  Protein:  80-95 grams  Fluid:  > 1.5 L    02/21/20, RD, LDN, CDCES Registered Dietitian II Certified Diabetes Care and Education Specialist Please refer to [facility] for RD and/or RD on-call/weekend/after hours pager

## 2019-12-25 ENCOUNTER — Inpatient Hospital Stay: Payer: Medicare HMO

## 2019-12-25 DIAGNOSIS — Z7189 Other specified counseling: Secondary | ICD-10-CM | POA: Diagnosis not present

## 2019-12-25 DIAGNOSIS — Z515 Encounter for palliative care: Secondary | ICD-10-CM

## 2019-12-25 DIAGNOSIS — R627 Adult failure to thrive: Secondary | ICD-10-CM

## 2019-12-25 LAB — GLUCOSE, CAPILLARY: Glucose-Capillary: 213 mg/dL — ABNORMAL HIGH (ref 70–99)

## 2019-12-25 LAB — COMPREHENSIVE METABOLIC PANEL
ALT: 51 U/L — ABNORMAL HIGH (ref 0–44)
AST: 50 U/L — ABNORMAL HIGH (ref 15–41)
Albumin: 3.5 g/dL (ref 3.5–5.0)
Alkaline Phosphatase: 37 U/L — ABNORMAL LOW (ref 38–126)
Anion gap: 17 — ABNORMAL HIGH (ref 5–15)
BUN: 19 mg/dL (ref 8–23)
CO2: 18 mmol/L — ABNORMAL LOW (ref 22–32)
Calcium: 8.5 mg/dL — ABNORMAL LOW (ref 8.9–10.3)
Chloride: 104 mmol/L (ref 98–111)
Creatinine, Ser: 1.04 mg/dL — ABNORMAL HIGH (ref 0.44–1.00)
GFR calc Af Amer: 58 mL/min — ABNORMAL LOW (ref >60)
GFR calc non Af Amer: 50 mL/min — ABNORMAL LOW (ref >60)
Glucose, Bld: 209 mg/dL — ABNORMAL HIGH (ref 70–99)
Potassium: 3.3 mmol/L — ABNORMAL LOW (ref 3.5–5.1)
Sodium: 139 mmol/L (ref 135–145)
Total Bilirubin: 1.8 mg/dL — ABNORMAL HIGH (ref 0.3–1.2)
Total Protein: 6.5 g/dL (ref 6.5–8.1)

## 2019-12-25 LAB — HEMOGLOBIN A1C
Hgb A1c MFr Bld: 6.2 % — ABNORMAL HIGH (ref 4.8–5.6)
Mean Plasma Glucose: 131 mg/dL

## 2019-12-25 LAB — BASIC METABOLIC PANEL
Anion gap: 14 (ref 5–15)
BUN: 16 mg/dL (ref 8–23)
CO2: 21 mmol/L — ABNORMAL LOW (ref 22–32)
Calcium: 8.7 mg/dL — ABNORMAL LOW (ref 8.9–10.3)
Chloride: 106 mmol/L (ref 98–111)
Creatinine, Ser: 0.84 mg/dL (ref 0.44–1.00)
GFR calc Af Amer: >60 mL/min (ref >60)
GFR calc non Af Amer: >60 mL/min (ref >60)
Glucose, Bld: 115 mg/dL — ABNORMAL HIGH (ref 70–99)
Potassium: 3.6 mmol/L (ref 3.5–5.1)
Sodium: 141 mmol/L (ref 135–145)

## 2019-12-25 LAB — MAGNESIUM
Magnesium: 2 mg/dL (ref 1.7–2.4)
Magnesium: 1.6 mg/dL — ABNORMAL LOW (ref 1.7–2.4)

## 2019-12-25 MED ORDER — LEVETIRACETAM IN NACL 1500 MG/100ML IV SOLN
1500.0000 mg | Freq: Once | INTRAVENOUS | Status: AC
  Administered 2019-12-26: 1500 mg via INTRAVENOUS
  Filled 2019-12-25: qty 100

## 2019-12-25 MED ORDER — LORAZEPAM 2 MG/ML IJ SOLN
2.0000 mg | INTRAMUSCULAR | Status: DC | PRN
  Administered 2019-12-25: 2 mg via INTRAVENOUS
  Filled 2019-12-25 (×2): qty 1

## 2019-12-25 MED ORDER — LEVETIRACETAM IN NACL 500 MG/100ML IV SOLN
500.0000 mg | Freq: Two times a day (BID) | INTRAVENOUS | Status: DC
  Administered 2019-12-26: 500 mg via INTRAVENOUS
  Filled 2019-12-25 (×3): qty 100

## 2019-12-25 NOTE — Progress Notes (Signed)
PROGRESS NOTE    Natalie Knight  XGZ:[id number] DOB: 1935/11/21 DOA: 12/22/2019 PCP: Tracie Harrier, MD (Confirm with patient/family/NH records and if not entered, this HAS to be entered at [facility] point of entry. "No PCP" if truly none.)   Brief Narrative: (Start on day 1 of progress note - keep it brief and live) Patient is 84 year old female with history of type diabetes, hypertension, vascular dementia and a prior history of a CVA chronic DVT on Eliquis who was admitted to the hospital on 4/10 for altered mental status.  She had a recent change of medicine with Namenda.  However, after speaking with patient daughter-in-law, patient had a 3 episodes of similar.  Of altered mental status.  The first time she was diagnosed with a stroke.  The second time, the situation lasted about few days, her condition resolved.  There was no clear diagnosis.  For the last few month, patient has been sleeping more than 16 to 18 hours a day.  However, patient has been very lethargic and sleepy all the time for the last few days.  Lenox Ponds has been discontinued since admission to the hospital.  He is still very sleepy.  Has not been able to eat. Reviewed echocardiogram, ejection fraction 60 to 80%.  4/13.  Patient mental status has not improved today.  Has not been able to eat.  Hospice has seen the patient, planning transfer to home with hospice tomorrow.   Assessment & Plan: #1.  Acute metabolic cephalopathy.  No evidence of stroke.  No evidence of bacterial infection.  Namenda has been discontinued, did not improve patient mental status.  Patient has not been eat since admission.  We will continue some IV fluids for now.  Hospice has evaluated patient, she will be transferred to home with hospice care by tomorrow.  #2.  Failure to thrive.  Patient has been deteriorating over the last few month.  At this point, patient has very low probability recovered from this.  Hospice consult.  3.  Vascular dementia  with a history of stroke.  Hold Namenda.  4.  Essential hypertension.  Continue clonidine patch.  Patient has not been able to take oral blood pressure medicine.  5.  Hypomagnesemia and hypokalemia.  Improved.    DVT prophylaxis: Lovenox Code Status: DNR Family Communication: Talked with patient son and daughter in law yesterday  Disposition Plan:  . Patient came from: Home           . Anticipated d/c place: Home with hospice. . Barriers to d/c OR conditions which need to be met to effect a safe d/c:   Consultants:   None  Procedures: None Antimicrobials: None  Subjective: Patient still very sleepy, opening eyes only.  Spoke with the nurse, patient still has not not been able to eat due to sleepiness. No short of breath or cough.  No pain.  Objective: Vitals:   12/25/19 0434 12/25/19 0806 12/25/19 1236 12/25/19 1627  BP: 129/63 118/60 124/86 134/66  Pulse: (!) 56 (!) 58 76 (!) 58  Resp: '16 16 16 16  ' Temp: 98 F (36.7 C) 98.1 F (36.7 C) 98.4 F (36.9 C) (!) 97 F (36.1 C)  TempSrc: Oral Axillary Axillary Axillary  SpO2: 100% 100% 99% 100%  Weight:      Height:        Intake/Output Summary (Last 24 hours) at 12/25/2019 1649 Last data filed at 12/25/2019 0500 Gross per 24 hour  Intake 3517.18 ml  Output 200 ml  Net 3317.18 ml   Filed Weights   12/22/19 1210  Weight: 79.6 kg    Examination:  General exam: Appears calm and comfortable  Respiratory system: Clear to auscultation. Respiratory effort normal. Cardiovascular system: S1 & S2 heard, RRR. No JVD, murmurs, rubs, gallops or clicks. No pedal edema. Gastrointestinal system: Abdomen is nondistended, soft and nontender. No organomegaly or masses felt. Normal bowel sounds heard. Central nervous system: Sleepy and confused.  No focal neurological deficits. Extremities: Symmetric  Skin: No rashes, lesions or ulcers Psychiatry: Unable to evaluate.    Data Reviewed: I have personally reviewed following  labs and imaging studies  CBC: Recent Labs  Lab 12/22/19 1212 12/24/19 0639  WBC 9.2 8.1  NEUTROABS 7.7  --   HGB 12.6 14.2  HCT 39.6 43.4  MCV 78.0* 75.1*  PLT 190 354   Basic Metabolic Panel: Recent Labs  Lab 12/22/19 1212 12/24/19 0639 12/25/19 0529  NA 136 138 141  K 4.2 3.1* 3.6  CL 103 98 106  CO2 21* 25 21*  GLUCOSE 188* 125* 115*  BUN '21 12 16  ' CREATININE 0.99 0.83 0.84  CALCIUM 9.4 9.1 8.7*  MG  --  0.9* 2.0  PHOS  --  2.5  --    GFR: Estimated Creatinine Clearance: 54.7 mL/min (by C-G formula based on SCr of 0.84 mg/dL). Liver Function Tests: Recent Labs  Lab 12/22/19 1212 12/24/19 0639  AST 29 27  ALT 27 25  ALKPHOS 42 33*  BILITOT 0.8 1.4*  PROT 6.3* 7.0  ALBUMIN 3.8 3.8   No results for input(s): LIPASE, AMYLASE in the last 168 hours. Recent Labs  Lab 12/23/19 1244  AMMONIA 23   Coagulation Profile: Recent Labs  Lab 12/22/19 1419  INR 0.9   Cardiac Enzymes: No results for input(s): CKTOTAL, CKMB, CKMBINDEX, TROPONINI in the last 168 hours. BNP (last 3 results) No results for input(s): PROBNP in the last 8760 hours. HbA1C: Recent Labs    12/23/19 0651  HGBA1C 6.2*   CBG: Recent Labs  Lab 12/22/19 1830  GLUCAP 105*   Lipid Profile: Recent Labs    12/23/19 0651  CHOL 181  HDL 64  LDLCALC 103*  TRIG 72  CHOLHDL 2.8   Thyroid Function Tests: Recent Labs    12/23/19 1244  TSH 1.289   Anemia Panel: Recent Labs    12/23/19 1244  VITAMINB12 2,129*   Sepsis Labs: No results for input(s): PROCALCITON, LATICACIDVEN in the last 168 hours.  Recent Results (from the past 240 hour(s))  SARS CORONAVIRUS 2 (TAT 6-24 HRS) Nasopharyngeal Nasopharyngeal Swab     Status: None   Collection Time: 12/22/19  3:17 PM   Specimen: Nasopharyngeal Swab  Result Value Ref Range Status   SARS Coronavirus 2 NEGATIVE NEGATIVE Final    Comment: (NOTE) SARS-CoV-2 target nucleic acids are NOT DETECTED. The SARS-CoV-2 RNA is generally  detectable in upper and lower respiratory specimens during the acute phase of infection. Negative results do not preclude SARS-CoV-2 infection, do not rule out co-infections with other pathogens, and should not be used as the sole basis for treatment or other patient management decisions. Negative results must be combined with clinical observations, patient history, and epidemiological information. The expected result is Negative. Fact Sheet for Patients: SugarRoll.be Fact Sheet for Healthcare Providers: https://www.woods-mathews.com/ This test is not yet approved or cleared by the [place] FDA and  has been authorized for detection and/or diagnosis of SARS-CoV-2 by FDA under an Emergency Use Authorization (EUA).  This EUA will remain  in effect (meaning this test can be used) for the duration of the COVID-19 declaration under Section 56 4(b)(1) of the Act, 21 U.S.C. section 360bbb-3(b)(1), unless the authorization is terminated or revoked sooner. Performed at [facility], [place] [address], [place], [place] [address]          Radiology Studies: ECHOCARDIOGRAM COMPLETE  Result Date: 12/24/2019    ECHOCARDIOGRAM REPORT   Patient Name:   Natalie Knight Date of Exam: 12/23/2019 Medical Rec #:  [id number]     Height:       65.0 in Accession #:    [id number]    Weight:       175.5 lb Date of Birth:  1936/01/26    BSA:          1.871 m Patient Age:    55 years      BP:           179/82 mmHg Patient Gender: F             HR:           66 bpm. Exam Location:  [facility] Procedure: 2D Echo, Cardiac Doppler and Color Doppler Indications:     R06.00 Dyspnea  History:         Patient has prior history of Echocardiogram examinations, most                  recent 04/19/2018. Risk Factors:Hypertension and Diabetes.  Sonographer:     Wilford Sports Rodgers-Jones Referring Phys:  [place] Diagnosing Phys: Kathlyn Sacramento MD IMPRESSIONS  1. Left ventricular  ejection fraction, by estimation, is 60 to 65%. The left ventricle has normal function. The left ventricle has no regional wall motion abnormalities. There is mild left ventricular hypertrophy. Left ventricular diastolic parameters were normal.  2. Right ventricular systolic function is normal. The right ventricular size is normal. There is normal pulmonary artery systolic pressure.  3. The mitral valve is abnormal. No evidence of mitral valve regurgitation. No evidence of mitral stenosis.  4. Tricuspid valve regurgitation is mild to moderate.  5. The aortic valve is normal in structure. Aortic valve regurgitation is not visualized. Mild to moderate aortic valve sclerosis/calcification is present, without any evidence of aortic stenosis. FINDINGS  Left Ventricle: Left ventricular ejection fraction, by estimation, is 60 to 65%. The left ventricle has normal function. The left ventricle has no regional wall motion abnormalities. The left ventricular internal cavity size was normal in size. There is  mild left ventricular hypertrophy. Left ventricular diastolic parameters were normal. Right Ventricle: The right ventricular size is normal. No increase in right ventricular wall thickness. Right ventricular systolic function is normal. There is normal pulmonary artery systolic pressure. The tricuspid regurgitant velocity is 2.38 m/s, and  with an assumed right atrial pressure of 5 mmHg, the estimated right ventricular systolic pressure is 66.5 mmHg. Left Atrium: Left atrial size was normal in size. Right Atrium: Right atrial size was normal in size. Pericardium: There is no evidence of pericardial effusion. Mitral Valve: The mitral valve is abnormal. Normal mobility of the mitral valve leaflets. Moderate mitral annular calcification. No evidence of mitral valve regurgitation. No evidence of mitral valve stenosis. Tricuspid Valve: The tricuspid valve is normal in structure. Tricuspid valve regurgitation is mild to  moderate. No evidence of tricuspid stenosis. Aortic Valve: The aortic valve is normal in structure. Aortic valve regurgitation is not visualized. Mild to moderate aortic valve sclerosis/calcification is  present, without any evidence of aortic stenosis. Pulmonic Valve: The pulmonic valve was normal in structure. Pulmonic valve regurgitation is mild. No evidence of pulmonic stenosis. Aorta: The aortic root is normal in size and structure. Venous: The inferior vena cava was not well visualized. IAS/Shunts: No atrial level shunt detected by color flow Doppler.  LEFT VENTRICLE PLAX 2D LVIDd:         3.62 cm  Diastology LVIDs:         2.35 cm  LV e' lateral:   6.53 cm/s LV PW:         0.96 cm  LV E/e' lateral: 11.9 LV IVS:        0.97 cm  LV e' medial:    6.09 cm/s LVOT diam:     1.80 cm  LV E/e' medial:  12.8 LV SV:         39 LV SV Index:   21 LVOT Area:     2.54 cm  RIGHT VENTRICLE RV Basal diam:  3.21 cm RV S prime:     11.40 cm/s TAPSE (M-mode): 1.6 cm LEFT ATRIUM             Index LA diam:        3.20 cm 1.71 cm/m LA Vol (A2C):   44.6 ml 23.83 ml/m LA Vol (A4C):   34.9 ml 18.65 ml/m LA Biplane Vol: 40.0 ml 21.38 ml/m  AORTIC VALVE LVOT Vmax:   80.80 cm/s LVOT Vmean:  55.200 cm/s LVOT VTI:    0.155 m  AORTA Ao Root diam: 3.20 cm Ao Asc diam:  3.10 cm MITRAL VALVE               TRICUSPID VALVE MV Area (PHT): 2.60 cm    TR Peak grad:   22.7 mmHg MV Decel Time: 292 msec    TR Vmax:        238.00 cm/s MV E velocity: 77.70 cm/s MV A velocity: 91.30 cm/s  SHUNTS MV E/A ratio:  0.85        Systemic VTI:  0.16 m                            Systemic Diam: 1.80 cm Kathlyn Sacramento MD Electronically signed by Kathlyn Sacramento MD Signature Date/Time: 12/24/2019/12:46:33 PM    Final         Scheduled Meds: .  stroke: mapping our early stages of recovery book   Does not apply Once  . aspirin  300 mg Rectal Daily   Or  . aspirin  325 mg Oral Daily  . cloNIDine  0.1 mg Transdermal Weekly  . enoxaparin (LOVENOX)  injection  40 mg Subcutaneous Q24H  . escitalopram  20 mg Oral Daily  . furosemide  40 mg Oral Daily  . losartan  50 mg Oral Daily  . magnesium oxide  800 mg Oral BID  . metoprolol tartrate  5 mg Intravenous Q6H  . omega-3 acid ethyl esters  1 g Oral BID  . traZODone  50 mg Oral QHS   Continuous Infusions: . sodium chloride 40 mL/hr at 12/24/19 1506     LOS: 2 days    Time spent: 26 minutes    Sharen Hones, MD [facility]   To contact the attending provider between 7A-7P or the covering provider during after hours 7P-7A, please log into the web site [email or web address] and access using universal [place] password for that web site.  If you do not have the password, please call the hospital operator.  12/25/2019, 4:49 PM

## 2019-12-25 NOTE — TOC Progression Note (Signed)
Transition of Care [facility]) - Progression Note    Patient Details  Name: Natalie Knight MRN: [id number] Date of Birth: 08/19/1936  [facility] CM/SW Contact  Barrie Dunker, RN Phone Number: 12/25/2019, 11:52 AM  Clinical Narrative:    Was notified by French Ana with Hospice that Palliative was in the room and she will see afterwards, The patient has 2 HCPOA and they need to talk and determine the plan, They will notify palliative and hospice of the plan tomorrow, I notified the physician   Expected Discharge Plan: Home w Hospice Care Barriers to Discharge: Continued Medical Work up  Expected Discharge Plan and Services Expected Discharge Plan: Home w Hospice Care   Discharge Planning Services: CM Consult, Other - See comment(Hospice)   Living arrangements for the past 2 months: Single Family Home                                       Social Determinants of Health (SDOH) Interventions    Readmission Risk Interventions No flowsheet data found.

## 2019-12-25 NOTE — TOC Progression Note (Signed)
Transition of Care [facility]) - Progression Note    Patient Details  Name: Natalie Knight MRN: [id number] Date of Birth: 05-28-1936  Transition of Care [facility]) CM/SW Arnold, RN Phone Number: 12/25/2019, 10:04 AM  Clinical Narrative:    Met with the patient's son Thayer Jew in the room and discussed plan of care and needs,  The patient lives alone, she is unable to live alone at this time she is still lethargic and npt waking up to participate in care,  I explained to the son that in order to go to rehab she would need to be able to participate in rehab, to go to long term facility she would need Medicaid long term, I encouraged him to apply for Medicaid for her as soon as possible, I explained the other alternative to go to long term care would be to self pay, he stated that the family could not afford that, I asked if he had considered Hospice, he said that he would like to talk to Hospice and get them involved, he would like to use [facility] due to them having the hospice Home, I called tracy with [facility], she stated that they do not have a bed right now but she would be happy to talk to the family   Expected Discharge Plan: Home w Hospice Care Barriers to Discharge: Continued Medical Work up  Expected Discharge Plan and Services Expected Discharge Plan: [place]   Discharge Planning Services: CM Consult, Other - See comment(Hospice)   Living arrangements for the past 2 months: Single Family Home                                       Social Determinants of Health (SDOH) Interventions    Readmission Risk Interventions No flowsheet data found.

## 2019-12-25 NOTE — Progress Notes (Signed)
Reported Seizure like activity unk own duration from nursing staff Bedside no seizure activity currently + dolls Pupil assessment difficult, pre report patietn with decreased mental status and sleeps most of the time  Spontaneous eye movement + dolls Left lower lip small laceration likely related to seizure No response to pain RUE or RLE Spontaneous movement on left upper and left lower extremities in addition to  Response to pain  dorsiflexion foot on right with plantar stroke Toes down plantar stroke on right VSS  Had additional seizure abated with ativan prior to CT scan CT scan head without contrast without acute findings and age realated changes Patients daughter updated via phone.  Attempted to reach son, left message and call back number paeint was started on keppra Neuro consult not done unless son would prefer given plans for hospice care

## 2019-12-25 NOTE — Progress Notes (Signed)
OT Cancellation Note  Patient Details Name: Natalie Knight MRN: [id number] DOB: 01-06-1936   Cancelled Treatment:    Reason Eval/Treat Not Completed: Fatigue/lethargy limiting ability to participate. Upon arrival pt lethargic and not command following. Per son at bedside and CHL, pt has been unable to awake to participate in care. OT has attempted x3 days to initiate services and pt has been lethargic and unable to participate in therapy each date. Will sign-off for now, please re-consult if status changes and pt becomes appropriate for therapy.   Kathie Dike, M.S. OTR/L  12/25/19, 1:00 PM

## 2019-12-25 NOTE — Progress Notes (Signed)
D: Pt observed as drowsy and fatigued. Pt has slept all day. Pt responds to voice. Toward end of shift pt would open eyes, respond to voice and was looking around to see where this writer was in r/t pt. Pt did not speak to writer, however did let out a few moans. Pt does not appear to be experiencing any pain. Pt turns self in bed frequently.  A: Scheduled medications administered to pt, per MD orders. Support and encouragement provided. Frequent verbal contact made.    R: No adverse drug reactions noted. Pt verbally contracts for safety at this time. Pt complaint with medications and treatment plan. Pt interacts well with staff on the unit. Will continue to monitor and provide care for as ordered.

## 2019-12-25 NOTE — Progress Notes (Signed)
MEDICATION RELATED CONSULT NOTE - INITIAL   Pharmacy Consult for Keppra Indication: seizure  No Known Allergies  Patient Measurements: Height: 5\' 5"  (165.1 cm) Weight: 79.6 kg (175 lb 7.8 oz) IBW/kg (Calculated) : 57  Vital Signs: Temp: 97 F (36.1 C) (04/13 1627) Temp Source: Axillary (04/13 1627) BP: 142/76 (04/13 2209) Pulse Rate: 88 (04/13 2209) Intake/Output from previous day: 04/12 0701 - 04/13 0700 In: 3517.2 [I.V.:3417.2; IV Piggyback:100] Out: 675 [Urine:675] Intake/Output from this shift: No intake/output data recorded.  Labs: Recent Labs    12/24/19 0639 12/25/19 0529  WBC 8.1  --   HGB 14.2  --   HCT 43.4  --   PLT 178  --   CREATININE 0.83 0.84  MG 0.9* 2.0  PHOS 2.5  --   ALBUMIN 3.8  --   PROT 7.0  --   AST 27  --   ALT 25  --   ALKPHOS 33*  --   BILITOT 1.4*  --    Estimated Creatinine Clearance: 54.7 mL/min (by C-G formula based on SCr of 0.84 mg/dL).  Medical History: Past Medical History:  Diagnosis Date  . Diabetes mellitus without complication (HCC)   . Hypertension    Medications:  Medications Prior to Admission  Medication Sig Dispense Refill Last Dose  . acetaminophen (TYLENOL) 325 MG tablet Take 2 tablets (650 mg total) by mouth every 6 (six) hours as needed for mild pain, moderate pain, fever or headache (headache). 30 tablet 0   . escitalopram (LEXAPRO) 20 MG tablet Take 1 tablet by mouth daily.     . feeding supplement, ENSURE ENLIVE, (ENSURE ENLIVE) LIQD Take 237 mLs by mouth 3 (three) times daily between meals. 237 mL 12   . furosemide (LASIX) 40 MG tablet Take 1 tablet (40 mg total) by mouth daily. 30 tablet 2   . losartan (COZAAR) 50 MG tablet Take 1 tablet (50 mg total) by mouth daily. 30 tablet 1   . magnesium oxide (MAG-OX) 400 (241.3 Mg) MG tablet Take 2 tablets (800 mg total) by mouth 2 (two) times daily. 60 tablet 0   . metoprolol tartrate (LOPRESSOR) 25 MG tablet Take 1 tablet (25 mg total) by mouth 2 (two) times  daily. 60 tablet 1   . Multiple Vitamin (MULTI-VITAMINS) TABS Take 1 tablet by mouth daily.     12/27/19 omega-3 acid ethyl esters (LOVAZA) 1 g capsule Take 1 g by mouth 2 (two) times daily.     . potassium chloride SA (K-DUR,KLOR-CON) 20 MEQ tablet Take 2 tablets (40 mEq total) by mouth daily. While on lasix 30 tablet 0   . traZODone (DESYREL) 50 MG tablet Take 1 tablet (50 mg total) by mouth at bedtime. 30 tablet 0    Assessment: Patient is 84 year old female with history of type diabetes, hypertension, vascular dementia and a prior history of a CVA chronic DVT onEliquiswho was admitted to the hospital on 4/10for altered mental status. She had a recent change of medicine with Namenda. However, after speaking with patient daughter-in-law, patient had a 3 episodes of similar. Of altered mental status. The first time she was diagnosed with a stroke. The second time, the situation lasted about few days, her condition resolved. There was no clear diagnosis. For the last few month, patient has been sleeping more than 16 to 18 hours a day. However, patient has been very lethargic and sleepy all the time for the last few days.  Discussed w/ RN and NP.   Plan:  Keppra 1500mg  x 1 then 500mg  IV q12hrs Monitor for tolerance, clinical response  Hart Robinsons A 12/25/2019,11:06 PM

## 2019-12-25 NOTE — Consult Note (Signed)
Consultation Note Date: 12/25/2019   Patient Name: Natalie Knight  DOB: 1936/01/09  MRN: [id number]  Age / Sex: 84 y.o., female  PCP: Tracie Harrier, MD Referring Physician: Sharen Hones, MD  Reason for Consultation: Establishing goals of care  HPI/Patient Profile: Natalie Knight  is a 84 y.o. female with a known history of diabetes mellitus, hypertension, vascular dementia,previous CVA,left DVT on Eliquis is being admitted for acute metabolic encephalopathy. pt was brought in from home via acems with c/o change in LOC.  Clinical Assessment and Goals of Care: Patient is resting in bed on her side with eyes closed. Son is sitting at bedside. He states he is HPOA. He states his sister is also HPOA. He states she has 5 children.  Natalie Knight lives alone. Son states she is able to clean, and do some cooking. He states she eats what she wants when she wants.    We discussed her diagnosis, prognosis, GOC, EOL wishes disposition and options.  A detailed discussion was had today regarding advanced directives.  Concepts specific to code status, artifical feeding and hydration, IV antibiotics and rehospitalization were discussed.  The difference between an aggressive medical intervention path and a comfort care path was discussed.  Values and goals of care important to patient and family were attempted to be elicited.  Discussed limitations of medical interventions to prolong quality of life in some situations and discussed the concept of human mortality.  He states "she began to go down hill after her favorite son died 59 or 6 years ago". He states she has given up on life and lost the will to live. Son states she talks about death often and states she is ready to go.   He states the family has not had a conversation about [place] or planning. He states he wanted to treat the treatable, but since she is not showing  improvement, he would like to shift to comfort based care and transfer to hospice facility. Plans to re-meet tomorrow where he will bring HPOA papers, and speak with family tonight.       SUMMARY OF RECOMMENDATIONS   Remeet tomorrow at 9:30. Son to bring HPOA papers. Will likely shift to comfort care and request D/C to hospice facility.   Prognosis:   < 2 weeks minimal to no oral intake.        Primary Diagnoses: Present on Admission: . Acute metabolic encephalopathy   I have reviewed the medical record, interviewed the patient and family, and examined the patient. The following aspects are pertinent.  Past Medical History:  Diagnosis Date  . Diabetes mellitus without complication ([place])   . Hypertension    Social History   Socioeconomic History  . Marital status: Widowed    Spouse name: Not on file  . Number of children: Not on file  . Years of education: Not on file  . Highest education level: Not on file  Occupational History  . Not on file  Tobacco Use  . Smoking status: Never Smoker  .  Smokeless tobacco: Current User    Types: Snuff  Substance and Sexual Activity  . Alcohol use: No  . Drug use: Not on file  . Sexual activity: Not on file  Other Topics Concern  . Not on file  Social History Narrative  . Not on file   Social Determinants of Health   Financial Resource Strain:   . Difficulty of Paying Living Expenses:   Food Insecurity:   . Worried About Programme researcher, broadcasting/film/video in the Last Year:   . Barista in the Last Year:   Transportation Needs:   . Freight forwarder (Medical):   Marland Kitchen Lack of Transportation (Non-Medical):   Physical Activity:   . Days of Exercise per Week:   . Minutes of Exercise per Session:   Stress:   . Feeling of Stress :   Social Connections:   . Frequency of Communication with Friends and Family:   . Frequency of Social Gatherings with Friends and Family:   . Attends Religious Services:   . Active Member of Clubs or  Organizations:   . Attends Banker Meetings:   Marland Kitchen Marital Status:    History reviewed. No pertinent family history. Scheduled Meds: .  stroke: mapping our early stages of recovery book   Does not apply Once  . aspirin  300 mg Rectal Daily   Or  . aspirin  325 mg Oral Daily  . cloNIDine  0.1 mg Transdermal Weekly  . enoxaparin (LOVENOX) injection  40 mg Subcutaneous Q24H  . escitalopram  20 mg Oral Daily  . furosemide  40 mg Oral Daily  . losartan  50 mg Oral Daily  . magnesium oxide  800 mg Oral BID  . metoprolol tartrate  5 mg Intravenous Q6H  . omega-3 acid ethyl esters  1 g Oral BID  . traZODone  50 mg Oral QHS   Continuous Infusions: . sodium chloride 40 mL/hr at 12/24/19 1506   PRN Meds:.acetaminophen **OR** acetaminophen (TYLENOL) oral liquid 160 mg/5 mL **OR** acetaminophen, senna-docusate Medications Prior to Admission:  Prior to Admission medications   Medication Sig Start Date End Date Taking? Authorizing Provider  acetaminophen (TYLENOL) 325 MG tablet Take 2 tablets (650 mg total) by mouth every 6 (six) hours as needed for mild pain, moderate pain, fever or headache (headache). 05/06/18   Enid Baas, MD  escitalopram (LEXAPRO) 20 MG tablet Take 1 tablet by mouth daily. 03/20/18   [provider]  feeding supplement, ENSURE ENLIVE, (ENSURE ENLIVE) LIQD Take 237 mLs by mouth 3 (three) times daily between meals. 05/06/18   Enid Baas, MD  furosemide (LASIX) 40 MG tablet Take 1 tablet (40 mg total) by mouth daily. 05/07/18   Enid Baas, MD  losartan (COZAAR) 50 MG tablet Take 1 tablet (50 mg total) by mouth daily. 05/07/18   Enid Baas, MD  magnesium oxide (MAG-OX) 400 (241.3 Mg) MG tablet Take 2 tablets (800 mg total) by mouth 2 (two) times daily. 05/07/18   Enid Baas, MD  metoprolol tartrate (LOPRESSOR) 25 MG tablet Take 1 tablet (25 mg total) by mouth 2 (two) times daily. 05/06/18 07/05/18  Enid Baas, MD   Multiple Vitamin (MULTI-VITAMINS) TABS Take 1 tablet by mouth daily.    [provider]  omega-3 acid ethyl esters (LOVAZA) 1 g capsule Take 1 g by mouth 2 (two) times daily.    [provider]  potassium chloride SA (K-DUR,KLOR-CON) 20 MEQ tablet Take 2 tablets (40 mEq total)  by mouth daily. While on lasix 05/07/18   Enid Baas, MD  traZODone (DESYREL) 50 MG tablet Take 1 tablet (50 mg total) by mouth at bedtime. 05/06/18   Enid Baas, MD   No Known Allergies Review of Systems  Unable to perform ROS   Physical Exam Constitutional:      Comments: Resting on left side with eyes closed.      Vital Signs: BP 118/60 (BP Location: Right Arm)   Pulse (!) 58   Temp 98.1 F (36.7 C) (Axillary)   Resp 16   Ht 5\' 5"  (1.651 m)   Wt 79.6 kg   SpO2 100%   BMI 29.20 kg/m  Pain Scale: 0-10   Pain Score: Asleep   SpO2: SpO2: 100 % O2 Device:SpO2: 100 % O2 Flow Rate: .   IO: Intake/output summary:   Intake/Output Summary (Last 24 hours) at 12/25/2019 1125 Last data filed at 12/25/2019 0500 Gross per 24 hour  Intake 3517.18 ml  Output 200 ml  Net 3317.18 ml    LBM:   Baseline Weight: Weight: 79.6 kg Most recent weight: Weight: 79.6 kg     Palliative Assessment/Data:     Time In: 10:40 Time Out: 11:30 Time Total: 50 min Greater than 50%  of this time was spent counseling and coordinating care related to the above assessment and plan.  Signed by: 12/27/2019, NP   Please contact Palliative Medicine Team phone at [phone] for questions and concerns.  For individual provider: See [phone]

## 2019-12-25 NOTE — Progress Notes (Signed)
Pt has been lethargic the majority of the shift. Hasn't been able to answer any of my questions. Not expressing any pain. Held Metoprolol per NP.

## 2019-12-25 NOTE — Progress Notes (Signed)
SLP Cancellation Note  Patient Details Name: Natalie Knight MRN: [id number] DOB: [birth year]   Cancelled treatment:       Reason Eval/Treat Not Completed: Medical issues which prohibited therapy;Patient's level of consciousness;Patient not medically ready(chart reviewed; consulted Palliative Care re: pt). ST services will be available if any change in pt's status next 1-2 days. Recommend frequent oral care for hygiene and stimulation of swallowing.     Jerilynn Som, MS, CCC-SLP [facility]il[facility] Villaflor 12/25/2019, 11:38 AM

## 2019-12-26 DIAGNOSIS — R627 Adult failure to thrive: Secondary | ICD-10-CM | POA: Diagnosis not present

## 2019-12-26 DIAGNOSIS — E876 Hypokalemia: Secondary | ICD-10-CM

## 2019-12-26 DIAGNOSIS — F0150 Vascular dementia without behavioral disturbance: Secondary | ICD-10-CM

## 2019-12-26 DIAGNOSIS — G9341 Metabolic encephalopathy: Secondary | ICD-10-CM | POA: Diagnosis not present

## 2019-12-26 DIAGNOSIS — R569 Unspecified convulsions: Secondary | ICD-10-CM

## 2019-12-26 DIAGNOSIS — R7301 Impaired fasting glucose: Secondary | ICD-10-CM

## 2019-12-26 LAB — BASIC METABOLIC PANEL
Anion gap: 15 (ref 5–15)
BUN: 19 mg/dL (ref 8–23)
CO2: 20 mmol/L — ABNORMAL LOW (ref 22–32)
Calcium: 8.6 mg/dL — ABNORMAL LOW (ref 8.9–10.3)
Chloride: 106 mmol/L (ref 98–111)
Creatinine, Ser: 0.92 mg/dL (ref 0.44–1.00)
GFR calc Af Amer: >60 mL/min (ref >60)
GFR calc non Af Amer: 58 mL/min — ABNORMAL LOW (ref >60)
Glucose, Bld: 173 mg/dL — ABNORMAL HIGH (ref 70–99)
Potassium: 3.5 mmol/L (ref 3.5–5.1)
Sodium: 141 mmol/L (ref 135–145)

## 2019-12-26 MED ORDER — DIAZEPAM 2.5 MG RE GEL: 2.5000 mg | Freq: Two times a day (BID) | RECTAL | Status: AC | PRN

## 2019-12-26 MED ORDER — GLYCOPYRROLATE 0.2 MG/ML IJ SOLN: 0.4000 mg | INTRAMUSCULAR | Status: AC | PRN

## 2019-12-26 MED ORDER — MORPHINE SULFATE (CONCENTRATE) 10 MG/0.5ML PO SOLN: 5.0000 mg | ORAL | Status: DC | PRN

## 2019-12-26 MED ORDER — MORPHINE SULFATE (CONCENTRATE) 10 MG/0.5ML PO SOLN: 5.0000 mg | ORAL | Status: AC | PRN

## 2019-12-26 MED ORDER — GLYCOPYRROLATE 0.2 MG/ML IJ SOLN
0.4000 mg | INTRAMUSCULAR | Status: DC | PRN
  Filled 2019-12-26: qty 2

## 2019-12-26 MED ORDER — DIAZEPAM 2.5 MG RE GEL: 2.5000 mg | Freq: Two times a day (BID) | RECTAL | Status: DC | PRN

## 2019-12-26 MED ORDER — ACETAMINOPHEN 650 MG RE SUPP: 650.0000 mg | RECTAL | 0 refills | Status: AC | PRN

## 2019-12-26 NOTE — TOC Transition Note (Signed)
Transition of Care [facility]) - CM/SW Discharge Note   Patient Details  Name: Natalie Knight MRN: [id number] Date of Birth: [birth year]  [facility] CM/SW Contact:  Barrie Dunker, RN Phone Number: 12/26/2019, 4:19 PM   Clinical Narrative:     The patient to DC to the Hospice facility today via EMS transport, the family is aware and agreeable, French Ana with [employer] is to call EMS, EMS will be to get the patient at 6 PM    Barriers to Discharge: Continued Medical Work up   Patient Goals and CMS Choice        Discharge Placement                       Discharge Plan and Services   Discharge Planning Services: CM Consult, Other - See comment(Hospice)                                 Social Determinants of Health (SDOH) Interventions     Readmission Risk Interventions No flowsheet data found.

## 2019-12-26 NOTE — Progress Notes (Signed)
Called report to Fraser Din RN  at [facility], she said to leave patient IV in at discharge

## 2019-12-26 NOTE — Care Management Important Message (Signed)
Important Message  Patient Details  Name: Natalie Knight MRN: [id number] Date of Birth: [birth year]   Medicare Important Message Given:  Yes     [name] A [name] 12/26/2019, 10:48 AM

## 2019-12-26 NOTE — Progress Notes (Signed)
Civil engineer, contracting [facility]) Hospital Liaison RN note  Received request from Marice Potter, RN with [facility] for family interest in Hospice Home. Chart reviewed and spoke with Morton Stall, NP with PMT. Spoke with son Zollie Beckers by phone to confirm interest and explain services. Family is agreeable to transfer today. Registration paperwork to be completed at 330 pm today.   RN please call report to [phone].  Please send completed and signed DNR with patient.  Thank you, Haynes Bast, BSN, RN [facility] Liaison [phone]

## 2019-12-26 NOTE — Progress Notes (Addendum)
Daily Progress Note   Patient Name: Natalie Knight       Date: 12/26/2019 DOB: [birth year]  Age: 84 y.o. MRN#: [id number] Attending Physician: Loletha Grayer, MD Primary Care Physician: Tracie Harrier, MD Admit Date: 12/22/2019  Reason for Consultation/Follow-up: Establishing goals of care  Subjective: Patient is resting in bed. She is not responsive and appears to be in no distress. Son Thayer Jew is at bedside. He states he updated his siblings and all are in agreement to shift to comfort. He has papers present for living will and durable POA. Durable POA lists he then his sister Inez Catalina Pettiford. The living will is marked "Follow this living will. My health agent cannot make decisions that are different from what I have stated in this living will."   It is marked that she would not want life prolonging measures:  For a condition that cannot be cured and that will result in death within a relatively short period of time. She becomes unconscious an it is determined with a high degree of medical certainty she will not regain consciousness.  That if there is a condition which results in substantial loss of ability to think, and doctors determine to a high degree of medical certainty, it will not get better.   It is also marked that she would not want a feeding tube. Patient is not eating or drinking and cannot sustain life.   Son would like patient to go to hospice facility. Attempted to call second POA to answer questions but call went directly to VM.   I completed a MOST form today and the signed original was placed in the chart. A photocopy was placed in the chart to be scanned into EMR. The patient's son outlined their wishes for the following treatment decisions:  Cardiopulmonary  Resuscitation: Do Not Attempt Resuscitation (DNR/No CPR)  Medical Interventions: Comfort Measures: Keep clean, warm, and dry. Use medication by any route, positioning, wound care, and other measures to relieve pain and suffering. Use oxygen, suction and manual treatment of airway obstruction as needed for comfort. Do not transfer to the hospital unless comfort needs cannot be met in current location.  Antibiotics: No antibiotics (use other measures to relieve symptoms)  IV Fluids: No IV fluids (provide other measures to ensure comfort)  Feeding Tube: No feeding tube  POA papers and living will copied and placed in chart.     Length of Stay: 3  Current Medications: Scheduled Meds:  .  stroke: mapping our early stages of recovery book   Does not apply Once  . aspirin  300 mg Rectal Daily   Or  . aspirin  325 mg Oral Daily  . cloNIDine  0.1 mg Transdermal Weekly  . enoxaparin (LOVENOX) injection  40 mg Subcutaneous Q24H  . escitalopram  20 mg Oral Daily  . furosemide  40 mg Oral Daily  . losartan  50 mg Oral Daily  . magnesium oxide  800 mg Oral BID  . metoprolol tartrate  5 mg Intravenous Q6H  . omega-3 acid ethyl esters  1 g Oral BID  . traZODone  50 mg Oral QHS    Continuous Infusions: . sodium chloride 40 mL/hr at 12/25/19 2123  . levETIRAcetam 500 mg (12/26/19 0952)    PRN Meds: acetaminophen **OR** acetaminophen (TYLENOL) oral liquid 160 mg/5 mL **OR** acetaminophen, LORazepam, senna-docusate  Physical Exam Constitutional:      Comments: Resting with eyes closed.   Pulmonary:     Effort: Pulmonary effort is normal.             Vital Signs: BP 123/68 (BP Location: Right Arm)   Pulse 70   Temp 98.4 F (36.9 C) (Oral)   Resp 18   Ht '5\' 5"'  (1.651 m)   Wt 79.6 kg   SpO2 100%   BMI 29.20 kg/m  SpO2: SpO2: 100 % O2 Device: O2 Device: Room Air O2 Flow Rate:    Intake/output summary:   Intake/Output Summary (Last 24 hours) at 12/26/2019 1015 Last data filed  at 12/26/2019 8115 Gross per 24 hour  Intake --  Output 200 ml  Net -200 ml   LBM:   Baseline Weight: Weight: 79.6 kg Most recent weight: Weight: 79.6 kg       Palliative Assessment/Data:      Patient Active Problem List   Diagnosis Date Noted  . Acute metabolic encephalopathy 72/62/0355  . Bacterial meningitis   . Sepsis ([place])   . Anorexia   . Goals of care, counseling/discussion   . Palliative care by specialist   . Meningitis 04/18/2018    Palliative Care Assessment & Plan    Recommendations/Plan:  Shift to comfort care and D/C to hospice facility    Code Status:    Code Status Orders  (From admission, onward)         Start     Ordered   12/22/19 1615  Do not attempt resuscitation (DNR)  Continuous    Question Answer Comment  In the event of cardiac or respiratory ARREST Do not call a "code blue"   In the event of cardiac or respiratory ARREST Do not perform Intubation, CPR, defibrillation or ACLS   In the event of cardiac or respiratory ARREST Use medication by any route, position, wound care, and other measures to relive pain and suffering. May use oxygen, suction and manual treatment of airway obstruction as needed for comfort.      12/22/19 1616        Code Status History    Date Active Date Inactive Code Status Order ID Comments User Context   04/19/2018 1155 05/06/2018 1738 DNR [id number]  Bradly Bienenstock, NP Inpatient   04/18/2018 1541 04/19/2018 1155 Full Code [id number]  Salary, Avel Peace, MD ED   Advance Care Planning Activity  Prognosis:   < 2 weeks Not eating or drinking. Dementia.    Care plan was discussed with Dr. Leslye Peer  Thank you for allowing the Palliative Medicine Team to assist in the care of this patient.   Time In: 9:50 Time Out: 10:30 Total Time 40 min Prolonged Time Billed  no      Greater than 50%  of this time was spent counseling and coordinating care related to the above assessment and plan.  Asencion Gowda,  NP  Please contact Palliative Medicine Team phone at [phone] for questions and concerns.

## 2019-12-26 NOTE — Progress Notes (Signed)
SLP Cancellation Note  Patient Details Name: Natalie Knight MRN: [id number] DOB: 1936/01/31   Cancelled treatment:       Reason Eval/Treat Not Completed: Medical issues which prohibited therapy;Patient's level of consciousness;Patient not medically ready(chart reviewed). Per Palliative Care NP note this morning, family is in agreement to shift to comfort care w/ transfer to [facility]. Recommend oral care as comfortable; aspiration precautions.  NSG to reconsult ST services if any new needs arise while admitted. ST will sign off at this time.     Jerilynn Som, MS, CCC-SLP Lamoine Fredricksen 12/26/2019, 11:11 AM

## 2019-12-26 NOTE — Progress Notes (Signed)
Nutrition Brief Follow-Up Note  Chart reviewed. Patient now transitioning to comfort care.   No further nutrition interventions warranted at this time. Please re-consult RD as needed.   Jaelie Aguilera King, MS, RD, LDN Pager number available on Amion   

## 2019-12-26 NOTE — Discharge Summary (Signed)
[facility] - [place] at [facility]   PATIENT NAME: Natalie Knight    MR#:  [id number]  DATE OF BIRTH:  1935-11-17  DATE OF ADMISSION:  12/22/2019 ADMITTING PHYSICIAN: Leatha Gilding, MD  DATE OF DISCHARGE: 12/26/2019  PRIMARY CARE PHYSICIAN: Barbette Reichmann, MD    ADMISSION DIAGNOSIS:  Disorientation [R41.0] Acute metabolic encephalopathy [G93.41] AMS (altered mental status) [R41.82]  DISCHARGE DIAGNOSIS:  Active Problems:   Acute metabolic encephalopathy   SECONDARY DIAGNOSIS:   Past Medical History:  Diagnosis Date  . Diabetes mellitus without complication (HCC)   . Hypertension     HOSPITAL COURSE:   1.  Acute metabolic encephalopathy.  Patient has not woken up the entire hospital stay and has not eaten.  Overall prognosis is poor and patient unlikely to survive much longer.  Patient will be discharged to the hospice home this evening. 2.  Failure to thrive.  The patient has been mostly unresponsive the entire hospital course. 3.  Vascular dementia with a history of stroke.  Namenda on hold. 4.  Essential hypertension.  Patient was placed on clonidine patch here.  Since the patient is now going to hospice we can get rid of that patch. 5.  Hypomagnesemia and hypokalemia.  These have been replaced during the hospital course. 6.  Impaired fasting glucose.  Hemoglobin A1c 6.2.  Patient not a diabetic. 7.  Seizure last night.  Patient started on Keppra.  Can use rectal Valium as needed.  Since the patient has not improved since admission patient was seen by palliative care and made comfort care measures and patient will be transferred to the hospice home.  Comfort care measures with Roxanol, glycopyrrolate and Valium ordered.  The patient's overall prognosis is poor and I do not think she will survive much longer since she has not eaten during the entire hospital stay.  DISCHARGE CONDITIONS:   Guarded  CONSULTS OBTAINED:  Palliative care  DRUG  ALLERGIES:  No Known Allergies  DISCHARGE MEDICATIONS:   Allergies as of 12/26/2019   No Known Allergies     Medication List    STOP taking these medications   acetaminophen 325 MG tablet Commonly known as: TYLENOL Replaced by: acetaminophen 650 MG suppository   escitalopram 20 MG tablet Commonly known as: LEXAPRO   feeding supplement (ENSURE ENLIVE) Liqd   furosemide 40 MG tablet Commonly known as: LASIX   losartan 50 MG tablet Commonly known as: COZAAR   magnesium oxide 400 (241.3 Mg) MG tablet Commonly known as: MAG-OX   metoprolol tartrate 25 MG tablet Commonly known as: LOPRESSOR   Multi-Vitamins Tabs   omega-3 acid ethyl esters 1 g capsule Commonly known as: LOVAZA   potassium chloride SA 20 MEQ tablet Commonly known as: KLOR-CON   traZODone 50 MG tablet Commonly known as: DESYREL     TAKE these medications   acetaminophen 650 MG suppository Commonly known as: TYLENOL Place 1 suppository (650 mg total) rectally every 4 (four) hours as needed for mild pain (or temp > 37.5 C (99.5 F)). Replaces: acetaminophen 325 MG tablet   diazepam 2.5 MG Gel Commonly known as: DIASTAT Place 2.5 mg rectally every 12 (twelve) hours as needed for seizure.   glycopyrrolate 0.2 MG/ML injection Commonly known as: ROBINUL Inject 2 mLs (0.4 mg total) into the vein every 4 (four) hours as needed (secretions).   morphine CONCENTRATE 10 MG/0.5ML Soln concentrated solution Take 0.25 mLs (5 mg total) by mouth every 2 (two) hours as needed for severe  pain or shortness of breath.        DISCHARGE INSTRUCTIONS:   Patient be discharged to the hospice home for comfort care measures  If you experience worsening of your admission symptoms, develop shortness of breath, life threatening emergency, suicidal or homicidal thoughts you must seek medical attention immediately by calling 911 or calling your MD immediately  if symptoms less severe.  You Must read complete  instructions/literature along with all the possible adverse reactions/side effects for all the Medicines you take and that have been prescribed to you. Take any new Medicines after you have completely understood and accept all the possible adverse reactions/side effects.   Please note  You were cared for by a hospitalist during your hospital stay. If you have any questions about your discharge medications or the care you received while you were in the hospital after you are discharged, you can call the unit and asked to speak with the hospitalist on call if the hospitalist that took care of you is not available. Once you are discharged, your primary care physician will handle any further medical issues. Please note that NO REFILLS for any discharge medications will be authorized once you are discharged, as it is imperative that you return to your primary care physician (or establish a relationship with a primary care physician if you do not have one) for your aftercare needs so that they can reassess your need for medications and monitor your lab values.    Today   CHIEF COMPLAINT:   Chief Complaint  Patient presents with  . Altered Mental Status    HISTORY OF PRESENT ILLNESS:  Natalie Knight  is a 84 y.o. female came in with altered mental status   VITAL SIGNS:  Blood pressure (!) 104/53, pulse 62, temperature 98.1 F (36.7 C), temperature source Oral, resp. rate 14, height 5\' 5"  (1.651 m), weight 79.6 kg, SpO2 100 %.  PHYSICAL EXAMINATION:  GENERAL:  84 y.o.-year-old patient lying in the bed with no acute distress.  With sternal rub she grimaces EYES: Patient resisting me opening up her eyes. HEENT: Head atraumatic, normocephalic.  Unable to look into mouth LUNGS: Normal breath sounds bilaterally, no wheezing, rales,rhonchi or crepitation. CARDIOVASCULAR: S1, S2 normal. No murmurs, rubs, or gallops.  ABDOMEN: Soft, non-tender, non-distended. Bowel sounds present. EXTREMITIES: No pedal  edema. NEUROLOGIC: Grimaces with sternal rub PSYCHIATRIC: Grimaces with sternal rub.    DATA REVIEW:   CBC Recent Labs  Lab 12/24/19 0639  WBC 8.1  HGB 14.2  HCT 43.4  PLT 178    Chemistries  Recent Labs  Lab 12/25/19 2320 12/25/19 2320 12/26/19 0436  NA 139   < > 141  K 3.3*   < > 3.5  CL 104   < > 106  CO2 18*   < > 20*  GLUCOSE 209*   < > 173*  BUN 19   < > 19  CREATININE 1.04*   < > 0.92  CALCIUM 8.5*   < > 8.6*  MG 1.6*  --   --   AST 50*  --   --   ALT 51*  --   --   ALKPHOS 37*  --   --   BILITOT 1.8*  --   --    < > = values in this interval not displayed.    Microbiology Results  Results for orders placed or performed during the hospital encounter of 12/22/19  SARS CORONAVIRUS 2 (TAT 6-24 HRS) Nasopharyngeal Nasopharyngeal Swab  Status: None   Collection Time: 12/22/19  3:17 PM   Specimen: Nasopharyngeal Swab  Result Value Ref Range Status   SARS Coronavirus 2 NEGATIVE NEGATIVE Final    Comment: (NOTE) SARS-CoV-2 target nucleic acids are NOT DETECTED. The SARS-CoV-2 RNA is generally detectable in upper and lower respiratory specimens during the acute phase of infection. Negative results do not preclude SARS-CoV-2 infection, do not rule out co-infections with other pathogens, and should not be used as the sole basis for treatment or other patient management decisions. Negative results must be combined with clinical observations, patient history, and epidemiological information. The expected result is Negative. Fact Sheet for Patients: SugarRoll.be Fact Sheet for Healthcare Providers: https://www.woods-mathews.com/ This test is not yet approved or cleared by the [place] FDA and  has been authorized for detection and/or diagnosis of SARS-CoV-2 by FDA under an Emergency Use Authorization (EUA). This EUA will remain  in effect (meaning this test can be used) for the duration of the COVID-19  declaration under Section 56 4(b)(1) of the Act, 21 U.S.C. section 360bbb-3(b)(1), unless the authorization is terminated or revoked sooner. Performed at [facility], [place] [address], [place], [place] [address]     RADIOLOGY:  CT HEAD WO CONTRAST  Result Date: 12/25/2019 CLINICAL DATA:  Altered mental status EXAM: CT HEAD WITHOUT CONTRAST TECHNIQUE: Contiguous axial images were obtained from the base of the skull through the vertex without intravenous contrast. COMPARISON:  MRI from 12/23/2019 FINDINGS: Brain: Chronic atrophic and ischemic changes are noted. No findings to suggest acute hemorrhage, acute infarction or space-occupying mass lesion are noted. Vascular: No hyperdense vessel or unexpected calcification. Skull: Normal. Negative for fracture or focal lesion. Sinuses/Orbits: No acute finding. Other: None. IMPRESSION: Chronic atrophic and ischemic changes similar to that noted on prior exams. No acute abnormality is noted. Electronically Signed   By: Inez Catalina M.D.   On: 12/25/2019 23:28     Management plans discussed with the patient, family and they are in agreement.  CODE STATUS:     Code Status Orders  (From admission, onward)         Start     Ordered   12/22/19 1615  Do not attempt resuscitation (DNR)  Continuous    Question Answer Comment  In the event of cardiac or respiratory ARREST Do not call a "code blue"   In the event of cardiac or respiratory ARREST Do not perform Intubation, CPR, defibrillation or ACLS   In the event of cardiac or respiratory ARREST Use medication by any route, position, wound care, and other measures to relive pain and suffering. May use oxygen, suction and manual treatment of airway obstruction as needed for comfort.      12/22/19 1616        Code Status History    Date Active Date Inactive Code Status Order ID Comments User Context   04/19/2018 1155 05/06/2018 1738 DNR [id number]  Bradly Bienenstock, NP Inpatient   04/18/2018 1541  04/19/2018 1155 Full Code [id number]  Salary, Avel Peace, MD ED   Advance Care Planning Activity      TOTAL TIME TAKING CARE OF THIS PATIENT: 31 minutes.    Loletha Grayer M.D on 12/26/2019 at 4:08 PM  Between 7am to 6pm - Pager - [phone]  After 6pm go to [email or web address] - password [email or web address]  [facility]  CC: Primary care physician; Tracie Harrier, MD

## 2020-01-12 DEATH — deceased
